# Patient Record
Sex: Male | Born: 1945 | Race: Black or African American | Hispanic: Yes | Marital: Married | Smoking: Former smoker
Health system: Southern US, Community
[De-identification: ages and names within clinical notes are randomized; demographics above are authoritative.]

## PROBLEM LIST (undated history)

## (undated) DIAGNOSIS — H269 Unspecified cataract: Secondary | ICD-10-CM

## (undated) DIAGNOSIS — D126 Benign neoplasm of colon, unspecified: Secondary | ICD-10-CM

## (undated) DIAGNOSIS — L309 Dermatitis, unspecified: Secondary | ICD-10-CM

## (undated) DIAGNOSIS — I10 Essential (primary) hypertension: Secondary | ICD-10-CM

## (undated) DIAGNOSIS — K219 Gastro-esophageal reflux disease without esophagitis: Secondary | ICD-10-CM

## (undated) DIAGNOSIS — I4891 Unspecified atrial fibrillation: Secondary | ICD-10-CM

## (undated) DIAGNOSIS — F4310 Post-traumatic stress disorder, unspecified: Secondary | ICD-10-CM

## (undated) DIAGNOSIS — E291 Testicular hypofunction: Secondary | ICD-10-CM

## (undated) DIAGNOSIS — M1990 Unspecified osteoarthritis, unspecified site: Secondary | ICD-10-CM

## (undated) DIAGNOSIS — J9691 Respiratory failure, unspecified with hypoxia: Secondary | ICD-10-CM

## (undated) DIAGNOSIS — F419 Anxiety disorder, unspecified: Secondary | ICD-10-CM

## (undated) DIAGNOSIS — G4733 Obstructive sleep apnea (adult) (pediatric): Secondary | ICD-10-CM

## (undated) DIAGNOSIS — G4700 Insomnia, unspecified: Secondary | ICD-10-CM

## (undated) DIAGNOSIS — Z8619 Personal history of other infectious and parasitic diseases: Secondary | ICD-10-CM

## (undated) DIAGNOSIS — Z9289 Personal history of other medical treatment: Secondary | ICD-10-CM

## (undated) DIAGNOSIS — C9250 Acute myelomonocytic leukemia, not having achieved remission: Secondary | ICD-10-CM

## (undated) DIAGNOSIS — N529 Male erectile dysfunction, unspecified: Secondary | ICD-10-CM

## (undated) DIAGNOSIS — E785 Hyperlipidemia, unspecified: Secondary | ICD-10-CM

## (undated) DIAGNOSIS — I444 Left anterior fascicular block: Secondary | ICD-10-CM

## (undated) DIAGNOSIS — G4730 Sleep apnea, unspecified: Secondary | ICD-10-CM

## (undated) HISTORY — PX: COLONOSCOPY: SHX174

## (undated) HISTORY — DX: Benign neoplasm of colon, unspecified: D12.6

## (undated) HISTORY — DX: Male erectile dysfunction, unspecified: N52.9

## (undated) HISTORY — DX: Gastro-esophageal reflux disease without esophagitis: K21.9

## (undated) HISTORY — DX: Testicular hypofunction: E29.1

## (undated) HISTORY — DX: Personal history of other medical treatment: Z92.89

## (undated) HISTORY — DX: Essential (primary) hypertension: I10

## (undated) HISTORY — DX: Left anterior fascicular block: I44.4

## (undated) HISTORY — DX: Unspecified osteoarthritis, unspecified site: M19.90

## (undated) HISTORY — DX: Post-traumatic stress disorder, unspecified: F43.10

## (undated) HISTORY — PX: HERNIA REPAIR: SHX51

## (undated) HISTORY — DX: Sleep apnea, unspecified: G47.30

## (undated) HISTORY — DX: Respiratory failure, unspecified with hypoxia: J96.91

## (undated) HISTORY — DX: Obstructive sleep apnea (adult) (pediatric): G47.33

## (undated) HISTORY — PX: QUADRICEPS TENDON REPAIR: SHX756

## (undated) HISTORY — DX: Unspecified atrial fibrillation: I48.91

## (undated) HISTORY — DX: Acute myelomonocytic leukemia, not having achieved remission: C92.50

## (undated) HISTORY — DX: Hyperlipidemia, unspecified: E78.5

## (undated) HISTORY — DX: Personal history of other infectious and parasitic diseases: Z86.19

---

## 1997-08-16 HISTORY — PX: KNEE ARTHROSCOPY: SUR90

## 2007-10-10 ENCOUNTER — Ambulatory Visit: Payer: Self-pay | Admitting: Internal Medicine

## 2007-10-10 DIAGNOSIS — M1990 Unspecified osteoarthritis, unspecified site: Secondary | ICD-10-CM | POA: Insufficient documentation

## 2007-10-10 LAB — CONVERTED CEMR LAB: Glucose, Bld: 208 mg/dL

## 2007-10-11 ENCOUNTER — Encounter: Payer: Self-pay | Admitting: Internal Medicine

## 2007-10-13 ENCOUNTER — Telehealth (INDEPENDENT_AMBULATORY_CARE_PROVIDER_SITE_OTHER): Payer: Self-pay | Admitting: *Deleted

## 2007-10-13 LAB — CONVERTED CEMR LAB
AST: 28 units/L (ref 0–37)
Basophils Relative: 0 % (ref 0.0–1.0)
CO2: 31 meq/L (ref 19–32)
Calcium: 9.3 mg/dL (ref 8.4–10.5)
Chloride: 103 meq/L (ref 96–112)
Eosinophils Absolute: 0.2 10*3/uL (ref 0.0–0.6)
Eosinophils Relative: 4.9 % (ref 0.0–5.0)
GFR calc Af Amer: 111 mL/min
GFR calc non Af Amer: 91 mL/min
Glucose, Bld: 132 mg/dL — ABNORMAL HIGH (ref 70–99)
HCT: 42.7 % (ref 39.0–52.0)
Lymphocytes Relative: 53.9 % — ABNORMAL HIGH (ref 12.0–46.0)
MCV: 84.7 fL (ref 78.0–100.0)
Neutro Abs: 1.6 10*3/uL (ref 1.4–7.7)
Neutrophils Relative %: 33.8 % — ABNORMAL LOW (ref 43.0–77.0)
Platelets: 472 10*3/uL — ABNORMAL HIGH (ref 150–400)
Sodium: 139 meq/L (ref 135–145)
WBC: 4.8 10*3/uL (ref 4.5–10.5)

## 2007-10-25 ENCOUNTER — Ambulatory Visit: Payer: Self-pay | Admitting: Internal Medicine

## 2007-10-25 DIAGNOSIS — E119 Type 2 diabetes mellitus without complications: Secondary | ICD-10-CM

## 2007-11-07 ENCOUNTER — Ambulatory Visit: Payer: Self-pay | Admitting: Internal Medicine

## 2007-11-13 ENCOUNTER — Telehealth (INDEPENDENT_AMBULATORY_CARE_PROVIDER_SITE_OTHER): Payer: Self-pay | Admitting: *Deleted

## 2007-11-27 ENCOUNTER — Encounter: Admission: RE | Admit: 2007-11-27 | Discharge: 2007-11-27 | Payer: Self-pay | Admitting: Internal Medicine

## 2007-11-27 ENCOUNTER — Encounter: Payer: Self-pay | Admitting: Internal Medicine

## 2008-01-01 ENCOUNTER — Ambulatory Visit: Payer: Self-pay | Admitting: Internal Medicine

## 2008-01-01 DIAGNOSIS — F4310 Post-traumatic stress disorder, unspecified: Secondary | ICD-10-CM

## 2008-01-02 ENCOUNTER — Encounter (INDEPENDENT_AMBULATORY_CARE_PROVIDER_SITE_OTHER): Payer: Self-pay | Admitting: *Deleted

## 2008-01-04 ENCOUNTER — Telehealth (INDEPENDENT_AMBULATORY_CARE_PROVIDER_SITE_OTHER): Payer: Self-pay | Admitting: *Deleted

## 2008-01-31 ENCOUNTER — Ambulatory Visit: Payer: Self-pay | Admitting: Internal Medicine

## 2008-02-28 ENCOUNTER — Encounter: Payer: Self-pay | Admitting: Internal Medicine

## 2008-02-28 ENCOUNTER — Ambulatory Visit: Payer: Self-pay | Admitting: Internal Medicine

## 2008-02-28 LAB — HM COLONOSCOPY

## 2008-03-03 ENCOUNTER — Encounter: Payer: Self-pay | Admitting: Internal Medicine

## 2008-03-04 ENCOUNTER — Encounter: Payer: Self-pay | Admitting: Internal Medicine

## 2008-03-08 ENCOUNTER — Ambulatory Visit: Payer: Self-pay | Admitting: Internal Medicine

## 2008-03-13 ENCOUNTER — Telehealth (INDEPENDENT_AMBULATORY_CARE_PROVIDER_SITE_OTHER): Payer: Self-pay | Admitting: *Deleted

## 2008-04-17 ENCOUNTER — Ambulatory Visit: Payer: Self-pay | Admitting: Internal Medicine

## 2008-04-17 DIAGNOSIS — F528 Other sexual dysfunction not due to a substance or known physiological condition: Secondary | ICD-10-CM

## 2008-04-17 DIAGNOSIS — E785 Hyperlipidemia, unspecified: Secondary | ICD-10-CM

## 2008-04-28 LAB — CONVERTED CEMR LAB
Hgb A1c MFr Bld: 6.2 % — ABNORMAL HIGH (ref 4.6–6.0)
Testosterone: 212.02 ng/dL — ABNORMAL LOW (ref 350.00–890)

## 2008-07-17 ENCOUNTER — Ambulatory Visit: Payer: Self-pay | Admitting: Internal Medicine

## 2008-07-17 DIAGNOSIS — H409 Unspecified glaucoma: Secondary | ICD-10-CM | POA: Insufficient documentation

## 2008-07-18 ENCOUNTER — Ambulatory Visit: Payer: Self-pay | Admitting: Internal Medicine

## 2008-07-23 ENCOUNTER — Telehealth (INDEPENDENT_AMBULATORY_CARE_PROVIDER_SITE_OTHER): Payer: Self-pay | Admitting: *Deleted

## 2008-07-24 ENCOUNTER — Telehealth (INDEPENDENT_AMBULATORY_CARE_PROVIDER_SITE_OTHER): Payer: Self-pay | Admitting: *Deleted

## 2008-07-24 LAB — CONVERTED CEMR LAB
ALT: 29 units/L (ref 0–53)
Cholesterol: 203 mg/dL (ref 0–200)
Direct LDL: 146 mg/dL
FSH: 4.7 milliintl units/mL
LH: 2.8 milliintl units/mL
Prolactin: 7.9 ng/mL
VLDL: 11 mg/dL (ref 0–40)

## 2008-08-06 ENCOUNTER — Encounter (INDEPENDENT_AMBULATORY_CARE_PROVIDER_SITE_OTHER): Payer: Self-pay | Admitting: *Deleted

## 2008-08-13 ENCOUNTER — Encounter: Payer: Self-pay | Admitting: Internal Medicine

## 2008-09-18 ENCOUNTER — Encounter: Payer: Self-pay | Admitting: Internal Medicine

## 2008-11-18 ENCOUNTER — Ambulatory Visit: Payer: Self-pay | Admitting: Internal Medicine

## 2008-11-18 DIAGNOSIS — E291 Testicular hypofunction: Secondary | ICD-10-CM | POA: Insufficient documentation

## 2008-11-21 LAB — CONVERTED CEMR LAB
ALT: 20 units/L (ref 0–53)
AST: 22 units/L (ref 0–37)
Basophils Absolute: 0.1 10*3/uL (ref 0.0–0.1)
Calcium: 8.7 mg/dL (ref 8.4–10.5)
GFR calc non Af Amer: 110.24 mL/min (ref >60)
Glucose, Bld: 104 mg/dL — ABNORMAL HIGH (ref 70–99)
HDL: 42.9 mg/dL (ref >39.00)
Hemoglobin: 14 g/dL (ref 13.0–17.0)
Lymphocytes Relative: 28.4 % (ref 12.0–46.0)
Monocytes Relative: 10.5 % (ref 3.0–12.0)
Neutro Abs: 3.6 10*3/uL (ref 1.4–7.7)
Platelets: 410 10*3/uL — ABNORMAL HIGH (ref 150.0–400.0)
RDW: 13.6 % (ref 11.5–14.6)
Sodium: 142 meq/L (ref 135–145)
Total CHOL/HDL Ratio: 4
WBC: 6.5 10*3/uL (ref 4.5–10.5)

## 2009-03-12 ENCOUNTER — Encounter (INDEPENDENT_AMBULATORY_CARE_PROVIDER_SITE_OTHER): Payer: Self-pay | Admitting: *Deleted

## 2009-05-15 ENCOUNTER — Ambulatory Visit: Payer: Self-pay | Admitting: Internal Medicine

## 2009-05-15 LAB — CONVERTED CEMR LAB
Microalb Creat Ratio: 15.6 mg/g (ref 0.0–30.0)
Microalb, Ur: 3 mg/dL — ABNORMAL HIGH (ref 0.0–1.9)

## 2009-05-20 ENCOUNTER — Encounter (INDEPENDENT_AMBULATORY_CARE_PROVIDER_SITE_OTHER): Payer: Self-pay | Admitting: *Deleted

## 2009-05-20 LAB — CONVERTED CEMR LAB
ALT: 27 units/L (ref 0–53)
AST: 28 units/L (ref 0–37)
Cholesterol: 176 mg/dL (ref 0–200)
PSA: 0.98 ng/mL (ref 0.10–4.00)

## 2009-09-23 ENCOUNTER — Ambulatory Visit: Payer: Self-pay | Admitting: Internal Medicine

## 2009-09-23 DIAGNOSIS — I10 Essential (primary) hypertension: Secondary | ICD-10-CM | POA: Insufficient documentation

## 2009-09-23 DIAGNOSIS — G479 Sleep disorder, unspecified: Secondary | ICD-10-CM | POA: Insufficient documentation

## 2009-09-25 ENCOUNTER — Encounter (INDEPENDENT_AMBULATORY_CARE_PROVIDER_SITE_OTHER): Payer: Self-pay | Admitting: *Deleted

## 2009-09-26 ENCOUNTER — Ambulatory Visit: Payer: Self-pay | Admitting: Internal Medicine

## 2009-09-26 LAB — CONVERTED CEMR LAB
ALT: 32 units/L (ref 0–53)
AST: 28 units/L (ref 0–37)
CO2: 32 meq/L (ref 19–32)
Chloride: 104 meq/L (ref 96–112)
GFR calc non Af Amer: 109.93 mL/min (ref >60)
Glucose, Bld: 108 mg/dL — ABNORMAL HIGH (ref 70–99)
Potassium: 4.4 meq/L (ref 3.5–5.1)
Sodium: 142 meq/L (ref 135–145)

## 2009-10-06 ENCOUNTER — Ambulatory Visit: Payer: Self-pay | Admitting: Pulmonary Disease

## 2009-10-06 DIAGNOSIS — G4733 Obstructive sleep apnea (adult) (pediatric): Secondary | ICD-10-CM

## 2009-10-14 DIAGNOSIS — I10 Essential (primary) hypertension: Secondary | ICD-10-CM

## 2009-10-14 HISTORY — DX: Essential (primary) hypertension: I10

## 2009-10-18 DIAGNOSIS — F518 Other sleep disorders not due to a substance or known physiological condition: Secondary | ICD-10-CM

## 2009-10-21 ENCOUNTER — Ambulatory Visit: Payer: Self-pay | Admitting: Internal Medicine

## 2009-10-28 ENCOUNTER — Telehealth (INDEPENDENT_AMBULATORY_CARE_PROVIDER_SITE_OTHER): Payer: Self-pay | Admitting: *Deleted

## 2009-10-29 ENCOUNTER — Ambulatory Visit: Payer: Self-pay | Admitting: Internal Medicine

## 2010-01-21 ENCOUNTER — Ambulatory Visit: Payer: Self-pay | Admitting: Internal Medicine

## 2010-01-30 ENCOUNTER — Ambulatory Visit: Payer: Self-pay | Admitting: Internal Medicine

## 2010-02-04 LAB — CONVERTED CEMR LAB
ALT: 25 units/L (ref 0–53)
BUN: 22 mg/dL (ref 6–23)
Chloride: 107 meq/L (ref 96–112)
Creatinine, Ser: 0.9 mg/dL (ref 0.4–1.5)
HDL: 43.3 mg/dL (ref >39.00)
Hgb A1c MFr Bld: 6.4 % (ref 4.6–6.5)
Total CHOL/HDL Ratio: 4
VLDL: 11.8 mg/dL (ref 0.0–40.0)

## 2010-02-05 ENCOUNTER — Ambulatory Visit: Payer: Self-pay | Admitting: Internal Medicine

## 2010-03-04 ENCOUNTER — Ambulatory Visit: Payer: Self-pay | Admitting: Internal Medicine

## 2010-03-12 ENCOUNTER — Encounter: Payer: Self-pay | Admitting: Internal Medicine

## 2010-05-25 ENCOUNTER — Ambulatory Visit: Payer: Self-pay | Admitting: Internal Medicine

## 2010-05-27 LAB — CONVERTED CEMR LAB
Basophils Absolute: 0.1 10*3/uL (ref 0.0–0.1)
Eosinophils Absolute: 0.1 10*3/uL (ref 0.0–0.7)
Hemoglobin: 14.1 g/dL (ref 13.0–17.0)
Hgb A1c MFr Bld: 6.6 % — ABNORMAL HIGH (ref 4.6–6.5)
Lymphocytes Relative: 24.7 % (ref 12.0–46.0)
MCHC: 34.3 g/dL (ref 30.0–36.0)
Monocytes Relative: 9.6 % (ref 3.0–12.0)
Neutro Abs: 4.1 10*3/uL (ref 1.4–7.7)
Neutrophils Relative %: 62.5 % (ref 43.0–77.0)
Platelets: 396 10*3/uL (ref 150.0–400.0)
RDW: 15.1 % — ABNORMAL HIGH (ref 11.5–14.6)
TSH: 0.93 microintl units/mL (ref 0.35–5.50)

## 2010-05-29 ENCOUNTER — Encounter: Payer: Self-pay | Admitting: Pulmonary Disease

## 2010-05-29 ENCOUNTER — Ambulatory Visit (HOSPITAL_BASED_OUTPATIENT_CLINIC_OR_DEPARTMENT_OTHER): Admission: RE | Admit: 2010-05-29 | Discharge: 2010-05-29 | Payer: Self-pay | Admitting: Pulmonary Disease

## 2010-06-17 ENCOUNTER — Telehealth (INDEPENDENT_AMBULATORY_CARE_PROVIDER_SITE_OTHER): Payer: Self-pay | Admitting: *Deleted

## 2010-06-17 ENCOUNTER — Ambulatory Visit: Payer: Self-pay | Admitting: Pulmonary Disease

## 2010-06-26 ENCOUNTER — Encounter: Payer: Self-pay | Admitting: Pulmonary Disease

## 2010-07-10 ENCOUNTER — Encounter: Payer: Self-pay | Admitting: Pulmonary Disease

## 2010-07-21 ENCOUNTER — Ambulatory Visit: Payer: Self-pay | Admitting: Pulmonary Disease

## 2010-08-25 ENCOUNTER — Ambulatory Visit: Admit: 2010-08-25 | Payer: Self-pay | Admitting: Pulmonary Disease

## 2010-08-26 ENCOUNTER — Telehealth: Payer: Self-pay | Admitting: Pulmonary Disease

## 2010-09-13 LAB — CONVERTED CEMR LAB
ALT: 22 units/L (ref 0–53)
BUN: 17 mg/dL (ref 6–23)
Calcium: 8.9 mg/dL (ref 8.4–10.5)
Cholesterol: 192 mg/dL (ref 0–200)
Creatinine, Ser: 0.8 mg/dL (ref 0.4–1.5)
GFR calc Af Amer: 127 mL/min
GFR calc non Af Amer: 105 mL/min
Glucose, Bld: 105 mg/dL — ABNORMAL HIGH (ref 70–99)
HDL: 32.9 mg/dL — ABNORMAL LOW (ref >39.0)
LDL Cholesterol: 147 mg/dL — ABNORMAL HIGH (ref 0–99)
PSA: 0.97 ng/mL (ref 0.10–4.00)
TSH: 1.17 microintl units/mL (ref 0.35–5.50)
VLDL: 13 mg/dL (ref 0–40)

## 2010-09-15 ENCOUNTER — Ambulatory Visit
Admission: RE | Admit: 2010-09-15 | Discharge: 2010-09-15 | Payer: Self-pay | Source: Home / Self Care | Attending: Pulmonary Disease | Admitting: Pulmonary Disease

## 2010-09-15 NOTE — Assessment & Plan Note (Signed)
Summary: rto 4 months/cbs   Vital Signs:  Patient profile:   65 year old male Weight:      266 pounds Pulse rate:   76 / minute Pulse rhythm:   regular BP sitting:   136 / 84  (left arm) Cuff size:   large  Vitals Entered By: [name] CMA (May 25, 2010 11:04 AM) CC: follow up- fasting Comments [facility] Declines flu shot    History of Present Illness: ROV  Diabetes-- diet ok, has cut down on fats  note from ophthalmology reviewed, eye exam 7-11, no  retinopathy, mild cataracts and glaucoma  hypertension, ambulatory BPs 109/70s   will try to get the sleep study here in [place] w/ Dr Shelle Iron .   Current Medications (verified): 1)  Freestyle Lite   Strp (Glucose Blood) .... Check Bs Three Times A Day Dx 250.00 2)  Freestyle Lite Lancets .... Check Bs Three Times A Day Dx 250.00 3)  Baby Aspirin 81 Mg  Chew (Aspirin) .Marland Kitchen.. 1 A  Day 4)  Lunesta 1 Mg  Tabs (Eszopiclone) .... One At Night 5)  Crestor 10 Mg Tabs (Rosuvastatin Calcium) .Marland Kitchen.. 1 By Mouth At Bedtime - Due Lab Work in 6 Weeks - Stop Pravachol 6)  Cialis 20 Mg Tabs (Tadalafil) .Marland Kitchen.. 1 By Mouth Every Other Day 7)  Eye Drops For Glaucoma 8)  Androgel .... (Per Endo) 9)  Lisinopril 20 Mg Tabs (Lisinopril) .Marland Kitchen.. 1 By Mouth Once Daily -- Needs Nurse Visit in 2 Weeks To Check Bp  Allergies (verified): No Known Drug Allergies  Past History:  Past Medical History: Osteoarthritis--L knee Diabetes mellitus, type II Dx 2-09 eye exam 7-11, no  retinopathy, mild cataracts and glaucoma hypertension, dx  3-11 Dx w/ PTSD (went to [name], has chronic diff.  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 started at age 86. 2 ppd.  quit 1993.    Alcohol Use - no Illicit Drug Use - no Patient gets regular exercise.  Review of Systems CV:  Denies chest pain or discomfort and swelling of feet. Resp:  Denies cough and shortness of breath.  Physical Exam  General:  alert and well-developed.  has gained some weight Lungs:  normal respiratory effort, no intercostal retractions, no accessory muscle use, and normal breath sounds.   Heart:  normal rate, regular rhythm, and no murmur.   Psych:  not anxious appearing and not depressed appearing.     Impression & Recommendations:  Problem # 1:  ESSENTIAL HYPERTENSION (ICD-401.9) at goal  His updated medication list for this problem includes:    Lisinopril 20 Mg Tabs (Lisinopril) .Marland Kitchen... 1 by mouth once daily -- needs nurse visit in 2 weeks to check bp  BP today: 136/84 Prior BP: 136/86 (03/04/2010)  Labs Reviewed: K+: 4.5 (01/30/2010) Creat: : 0.9 (01/30/2010)   Chol: 156 (01/30/2010)   HDL: 43.30 (01/30/2010)   LDL: 101 (01/30/2010)   TG: 59.0 (01/30/2010)  Orders: TLB-CBC Platelet - w/Differential (85025-CBCD) Specimen Handling (16109)  Orders: TLB-CBC Platelet - w/Differential (85025-CBCD) Specimen Handling (60454)  Problem # 2:  DIABETES MELLITUS, TYPE II (ICD-250.00) labs, diet discussed , info provided  has gained some weight, encourage to work on healthier lifestyle  His updated medication  list for this problem includes:    Baby Aspirin 81 Mg Chew (Aspirin) .Marland Kitchen... 1 a  day    Lisinopril 20 Mg Tabs (Lisinopril) .Marland Kitchen... 1 by mouth once daily -- needs nurse visit in 2 weeks to check bp  Labs Reviewed: Creat: 0.9 (01/30/2010)    Reviewed HgBA1c results: 6.4 (01/30/2010)  6.2 (05/15/2009)  Orders: Venipuncture (09811) TLB-A1C / Hgb A1C (Glycohemoglobin) (83036-A1C) Specimen Handling (91478)  Problem # 3:  SLEEP DISORDER (ICD-780.50) needs a sleep study, unable to get it done through [facility] will  send a note to DR Clance to see how to reschedule   Complete  Medication List: 1)  Freestyle Lite Strp (Glucose blood) .... Check bs three times a day dx 250.00 2)  Freestyle Lite Lancets  .... Check bs three times a day dx 250.00 3)  Baby Aspirin 81 Mg Chew (Aspirin) .Marland Kitchen.. 1 a  day 4)  Lunesta 1 Mg Tabs (Eszopiclone) .... One at night 5)  Crestor 10 Mg Tabs (Rosuvastatin calcium) .Marland Kitchen.. 1 by mouth at bedtime - due lab work in 6 weeks - stop pravachol 6)  Cialis 20 Mg Tabs (Tadalafil) .Marland Kitchen.. 1 by mouth every other day 7)  Eye Drops For Glaucoma  8)  Androgel  .... (per endo) 9)  Lisinopril 20 Mg Tabs (Lisinopril) .Marland Kitchen.. 1 by mouth once daily -- needs nurse visit in 2 weeks to check bp  Other Orders: TLB-TSH (Thyroid Stimulating Hormone) (84443-TSH)  Patient Instructions: 1)  Please schedule a follow-up appointment in 4 to 5 months .  Prescriptions: LISINOPRIL 20 MG TABS (LISINOPRIL) 1 by mouth once daily -- NEEDS NURSE VISIT IN 2 WEEKS TO CHECK BP  #90 x 1   Entered by:   [name] CMA   Authorized by:   Nolon Rod. Tauno Falotico MD   Signed by:   Barbaraann Share MD on 05/25/2010   Method used:   Electronically to        [id number]* (retail)       [address]       [place], [place]  [address]       Ph: [phone]       Fax: [phone]   RxID:   [id number] CRESTOR 10 MG TABS (ROSUVASTATIN CALCIUM) 1 by mouth at bedtime - DUE LAB WORK IN 6 WEEKS - STOP PRAVACHOL  #90 x 1   Entered by:   [name] CMA   Authorized by:   Nolon Rod. Kyce Ging MD   Signed by:   Barbaraann Share MD on 05/25/2010   Method used:   Electronically to        [id number]* (retail)       [address]       [place], [place]  [address]       Ph: [phone]       Fax: [phone]   RxID:   [id number]    Influenza Immunization History:    Influenza # 1:  to get it at work  (05/25/2010)

## 2010-09-15 NOTE — Assessment & Plan Note (Signed)
Summary: consult for possible osa   Copy to:  [facility] Primary Provider/Referring Provider:  Nolon Rod. Paz MD  CC:  Sleep Consult.  History of Present Illness: The pt is a 65y/o male who I have been asked to see for possible osa.  He has been noted to have loud snoring by his bed partner, as well as choking arousals and pauses in his breathing during sleep.  He goes to bed btw 83mn-2am, and arises at 5am to start his day.  He feels rested upon awakening, but then begins to have severe sleep pressure starting at 2pm.  He can have significant sleepiness at work in the afternoons, and dozes with tv or movies in the evening.  He also has some sleepiness with driving.  His weight is up 40 pounds over the last 2 years, and his epworth score today is 19.  Medications Prior to Update: 1)  Freestyle Lite   Strp (Glucose Blood) .... Check Bs Three Times A Day Dx 250.00 2)  Freestyle Lite Lancets .... Check Bs Three Times A Day Dx 250.00 3)  Baby Aspirin 81 Mg  Chew (Aspirin) .Marland Kitchen.. 1 A  Day 4)  Lunesta 1 Mg  Tabs (Eszopiclone) .... One At Night 5)  Crestor 10 Mg Tabs (Rosuvastatin Calcium) .Marland Kitchen.. 1 By Mouth At Bedtime - Due Lab Work in 6 Weeks - Stop Pravachol 6)  Cialis 20 Mg Tabs (Tadalafil) .Marland Kitchen.. 1 By Mouth Every Other Day 7)  Eye Drops For Glaucoma 8)  Flexeril 10 Mg Tabs (Cyclobenzaprine Hcl) .Marland Kitchen.. 1 By Mouth Qhs 9)  Androgel .... (Per Endo)  Allergies (verified): No Known Drug Allergies  Past History:  Past Medical History: Reviewed history from 09/23/2009 and no changes required. Osteoarthritis--L knee Diabetes mellitus, type II Dx 2-09 Dx w/ PTSD (went to [name], has chronic diff.  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 h/o Meniscal torn L, arthroscopy Dr Jerl Santos  (1999)  Family History: Reviewed history from 01/31/2008 and no changes  required. heart disease: mother, father cancer: father (colon)   Social History: Reviewed history from 01/31/2008 and no changes required. 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 started at age 65. 2 ppd.  quit 1993.  Alcohol Use - no Illicit Drug Use - no Patient gets regular exercise.  Review of Systems  The patient denies shortness of breath with activity, shortness of breath at rest, productive cough, non-productive cough, coughing up blood, chest pain, irregular heartbeats, acid heartburn, indigestion, loss of appetite, weight change, abdominal pain, difficulty swallowing, sore throat, tooth/dental problems, headaches, nasal congestion/difficulty breathing through nose, sneezing, itching, ear ache, anxiety, depression, hand/feet swelling, joint stiffness or pain, rash, change in color of mucus, and fever.    Vital Signs:  Patient profile:   65 year old male Height:      71 inches Weight:      276.13 pounds BMI:     38.65 O2 Sat:      97 % on Room air Temp:     97.8 degrees F oral Pulse rate:   100 / minute BP sitting:   158 / 100  (right arm) Cuff size:   large  Vitals Entered By: Arman Filter LPN (October 06, 2009 11:47 AM)  O2 Flow:  Room air CC:  Sleep Consult Comments Medications reviewed with patient Arman Filter LPN  October 06, 2009 11:58 AM    Physical Exam  General:  ow male in nad Eyes:  PERRLA and EOMI.   Nose:  deviated septum to left with near obstruction Mouth:  significant elongation of soft palate and uvula side wall encroachment Neck:  no jvd, tmg, LN Lungs:  clear to auscultation Heart:  rrr, no mrg Abdomen:  soft and nontender, bs+ Extremities:  mild edema, pulses intact distally no cyanosis Neurologic:  alert, and oriented, moves all 4.   Impression & Recommendations:  Problem # 1:  OBSTRUCTIVE SLEEP APNEA (ICD-327.23) the  pt's history is very suggestive of osa.  He has very abnormal upper airway anatomy, large recent weight gain, and signficant afternoon sleepiness with periods of inactivity.  I have had a long discussion with pt about osa, including its impact on his QOL and CV health.  At this point, I think he needs a sleep study.    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  I have asked him to try and prioritize for at least 6hrs of sleep a night. I have also reminded him of his moral responsibility to not drive if sleepy.  Other Orders: Consultation Level IV (95284) Sleep Disorder Referral (Sleep Disorder)  Patient Instructions: 1)  will schedule for sleep study 2)  will arrange followup once sleep study results available.

## 2010-09-15 NOTE — Letter (Signed)
Summary: no DM retinopathy, + mild glaucoma, +cataracts  [facility]   Imported By: Lanelle Bal 03/25/2010 11:28:59  _____________________________________________________________________  External Attachment:    Type:   Image     Comment:   External Document  Appended Document: no DM retinopathy, + mild glaucoma, +cataracts    Clinical Lists Changes  Observations: Added new observation of EYE EXAM: Neg DM eye exam (03/31/2010 8:07)

## 2010-09-15 NOTE — Assessment & Plan Note (Signed)
Summary: bp checl/cbs  Nurse Visit   Vital Signs:  Patient profile:   65 year old male Height:      71 inches Weight:      261 pounds Temp:     98.2 degrees F oral Pulse rate:   68 / minute BP sitting:   100 / 72  (left arm)  Vitals Entered By: Jeremy Johann CMA (February 05, 2010 10:56 AM)  Impression & Recommendations:  Problem # 1:  ESSENTIAL HYPERTENSION (ICD-401.9) BP slightly low today, see instructions His updated medication list for this problem includes:    Lisinopril 20 Mg Tabs (Lisinopril) .Marland Kitchen... 1 by mouth once daily -- needs nurse visit in 2 weeks to check bp  Orders: No Charge Patient Arrived (NCPA0) (NCPA0)  BP today: 100/72 Prior BP: 160/86 (01/21/2010)  Labs Reviewed: K+: 4.5 (01/30/2010) Creat: : 0.9 (01/30/2010)   Chol: 156 (01/30/2010)   HDL: 43.30 (01/30/2010)   LDL: 101 (01/30/2010)   TG: 59.0 (01/30/2010)  Complete Medication List: 1)  Freestyle Lite Strp (Glucose blood) .... Check bs three times a day dx 250.00 2)  Freestyle Lite Lancets  .... Check bs three times a day dx 250.00 3)  Baby Aspirin 81 Mg Chew (Aspirin) .Marland Kitchen.. 1 a  day 4)  Lunesta 1 Mg Tabs (Eszopiclone) .... One at night 5)  Crestor 10 Mg Tabs (Rosuvastatin calcium) .Marland Kitchen.. 1 by mouth at bedtime - due lab work in 6 weeks - stop pravachol 6)  Cialis 20 Mg Tabs (Tadalafil) .Marland Kitchen.. 1 by mouth every other day 7)  Eye Drops For Glaucoma  8)  Androgel  .... (per endo) 9)  Lisinopril 20 Mg Tabs (Lisinopril) .Marland Kitchen.. 1 by mouth once daily -- needs nurse visit in 2 weeks to check bp   Patient Instructions: 1)  Please schedule a follow-up appointment in 1 month for BP check. nurse visit only. 2)  --monitor symptoms for low bp readings  CC: bp check Comments --pt denies any symptoms REVIEWED MED LIST, PATIENT AGREED DOSE AND INSTRUCTION CORRECT    Allergies (verified): No Known Drug Allergies  Orders Added: 1)  No Charge Patient Arrived (NCPA0) [NCPA0]

## 2010-09-15 NOTE — Progress Notes (Signed)
Summary: pt needs ov to discuss sleep study results  Phone Note Outgoing Call   Call placed by: Carver Fila,  June 17, 2010 9:18 AM Call placed to: Patient Summary of Call: lmomtcb x1. pt needs ov with Dr. Shelle Iron  to discuss sleep study results. Carver Fila  June 17, 2010 9:19 AM   Follow-up for Phone Call        lmomtcb x2 Carver Fila  June 19, 2010 8:44 AM   Additional Follow-up for Phone Call Additional follow up Details #1::        lm with wife for him to give our office a call back Carver Fila  June 22, 2010 10:32 AM       Additional Follow-up for Phone Call Additional follow up Details #2::    lmomtcb x3.. will send a letter out to patient home stating for him to call us. Carver Fila  June 26, 2010 4:57 PM

## 2010-09-15 NOTE — Letter (Signed)
Summary: [facility]  [address]   [place], [place] [address]   Phone: [phone]  Fax: [phone]    06/26/2010  William Stokes [address] [place], [place]  [address]  Dear [name],   We have attempted to contact you multiple times at your home phone number and have been unable to reach you. If you would please give our office a call back to schedule an office visit with Dr. Shelle Iron as soon as possible. Our office hours are from 8:00 a.m. to 5:30 p.m. Monday through Friday.       Sincerely,   [employer]

## 2010-09-15 NOTE — Assessment & Plan Note (Signed)
Summary: 4 mth fu/kdc   Vital Signs:  Patient profile:   65 year old Stokes Height:      71 inches Weight:      265 pounds Temp:     98.5 degrees F oral Pulse rate:   86 / minute BP sitting:   160 / 86  (left arm)  Vitals Entered By: Jeremy Johann CMA (January 21, 2010 8:45 AM) CC: f/u elbow Comments --discuss med record REVIEWED MED LIST, PATIENT AGREED DOSE AND INSTRUCTION CORRECT    History of Present Illness: OSA-- s/p eval by pulmonary, symptoms quite suggestive for OSA, note reviewed, was offered a sleep study. he went to his sleep study test and he was told he was not in the schedule. Patient frustrated.  elbow infection, MRSA, responded quikly to abx, doing well    Diabetes mellitus-- ambulatory CBGs  x3  a week, in the low 100s; runned out of lisinopril a while back  Hyperlipidemia-- good medication compliance , no apparent s/e     Allergies (verified): No Known Drug Allergies  Past History:  Past Medical History: Osteoarthritis--L knee Diabetes mellitus, type II Dx 2-09 hypertension, dx  3-11 Dx w/ PTSD (went to [name], has chronic diff.  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 h/o Meniscal torn L, arthroscopy Dr Jerl Santos  (1999)  Social History: Reviewed history from 10/06/2009 and no changes required. 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 started at age 89. 2 ppd.  quit 1993.  Alcohol Use - no Illicit Drug Use - no Patient gets regular exercise.  Review of Systems General:  feels well, rarely fatigued . CV:  Denies chest pain or discomfort and swelling of feet. Resp:  Denies cough and shortness of breath. GI:  Denies nausea and vomiting.  Physical Exam  General:  alert, well-developed, and  overweight-appearing.   Lungs:  normal respiratory effort, no intercostal retractions, no accessory muscle use, and normal breath sounds.   Heart:  normal rate, regular rhythm, and no murmur.   Extremities:  no pretibial edema bilaterally    Impression & Recommendations:  Problem # 1:  OBSTRUCTIVE SLEEP APNEA (ICD-327.23) per patient, he went to his sleep study and was told he was not on the  schedule. He is [facility]  Problem # 2:  HYPERLIPIDEMIA (ICD-272.4) now on Crestor, labs His updated medication list for this problem includes:    Crestor 10 Mg Tabs (Rosuvastatin calcium) .Marland Kitchen... 1 by mouth at bedtime - due lab work in 6 weeks - stop pravachol  Labs Reviewed: SGOT: 28 (09/26/2009)   SGPT: 32 (09/26/2009)   HDL:43.40 (09/26/2009), 39.00 (05/15/2009)  LDL:116 (09/26/2009), 126 (05/15/2009)  Chol:172 (09/26/2009), 176 (05/15/2009)  Trig:63.0 (09/26/2009), 56.0 (05/15/2009)  Problem # 3:  ESSENTIAL HYPERTENSION (ICD-401.9) out of lisinopril for a while. restart meds, see instructions no charge for nurse visit His updated medication list for this problem includes:    Lisinopril 20 Mg Tabs (Lisinopril) .Marland Kitchen... 1 by mouth once daily -- needs nurse visit in 2 weeks to check bp  Problem # 4:  DIABETES MELLITUS, TYPE II (ICD-250.00)  labs His updated medication list for this problem includes:    Baby Aspirin 81 Mg Chew (Aspirin) .Marland KitchenMarland KitchenMarland KitchenMarland Kitchen  1 a  day    Lisinopril 20 Mg Tabs (Lisinopril) .Marland Kitchen... 1 by mouth once daily -- needs nurse visit in 2 weeks to check bp  Labs Reviewed: Creat: 0.9 (09/26/2009)    Reviewed HgBA1c results: 6.2 (05/15/2009)  6.2 (11/18/2008)  Complete Medication List: 1)  Freestyle Lite Strp (Glucose blood) .... Check bs three times a day dx 250.00 2)  Freestyle Lite Lancets  .... Check bs three times a day dx 250.00 3)  Baby Aspirin 81 Mg Chew (Aspirin) .Marland Kitchen.. 1 a  day 4)  Lunesta 1 Mg Tabs  (Eszopiclone) .... One at night 5)  Crestor 10 Mg Tabs (Rosuvastatin calcium) .Marland Kitchen.. 1 by mouth at bedtime - due lab work in 6 weeks - stop pravachol 6)  Cialis 20 Mg Tabs (Tadalafil) .Marland Kitchen.. 1 by mouth every other day 7)  Eye Drops For Glaucoma  8)  Androgel  .... (per endo) 9)  Lisinopril 20 Mg Tabs (Lisinopril) .Marland Kitchen.. 1 by mouth once daily -- needs nurse visit in 2 weeks to check bp  Patient Instructions: 1)  please restart lisinopril 2)  Come back in 2 weeks, fasting,  for a BP check and labs (no charge) 3)  FLP, AST, ALT---- dx high cholesterol 4)  BMP----- dx  hypertension 5)  Hemoglobin A1c, microalbumin----- dx  diabetes 6)  Please schedule a follow-up appointment in 4 months .  Prescriptions: LISINOPRIL 20 MG TABS (LISINOPRIL) 1 by mouth once daily -- NEEDS NURSE VISIT IN 2 WEEKS TO CHECK BP  #90 x 1   Entered and Authorized by:   Nolon Rod. Marshe Shrestha MD   Signed by:   Nolon Rod. Patti Shorb MD on 01/21/2010   Method used:   Electronically to        [id number]* (retail)       [address]       [place], [place]  [address]       Ph: [phone]       Fax: [phone]   RxID:   [id number] CRESTOR 10 MG TABS (ROSUVASTATIN CALCIUM) 1 by mouth at bedtime - DUE LAB WORK IN 6 WEEKS - STOP PRAVACHOL  #90 x 1   Entered and Authorized by:   Nolon Rod. Danicka Hourihan MD   Signed by:   Nolon Rod. Dannette Kinkaid MD on 01/21/2010   Method used:   Electronically to        [id number]* (retail)       [address]       [place], [place]  [address]       Ph: [phone]       Fax: [phone]   RxID:   [id number]

## 2010-09-15 NOTE — Assessment & Plan Note (Signed)
Summary: acue/rash right elbow/alr   Vital Signs:  Patient profile:   65 year old male Height:      71 inches Weight:      267.8 pounds Temp:     98.5 degrees F BP sitting:   140 / 80  Vitals Entered By: Shary Decamp (October 29, 2009 3:59 PM) CC: sore on rt elbow x 4 days, + drainage, hx of MRSA a few years ago   History of Present Illness:  sore on rt elbow x 4 days, + drainage, hx of MRSA a few years ago  Current Medications (verified): 1)  Freestyle Lite   Strp (Glucose Blood) .... Check Bs Three Times A Day Dx 250.00 2)  Freestyle Lite Lancets .... Check Bs Three Times A Day Dx 250.00 3)  Baby Aspirin 81 Mg  Chew (Aspirin) .Marland Kitchen.. 1 A  Day 4)  Lunesta 1 Mg  Tabs (Eszopiclone) .... One At Night 5)  Crestor 10 Mg Tabs (Rosuvastatin Calcium) .Marland Kitchen.. 1 By Mouth At Bedtime - Due Lab Work in 6 Weeks - Stop Pravachol 6)  Cialis 20 Mg Tabs (Tadalafil) .Marland Kitchen.. 1 By Mouth Every Other Day 7)  Eye Drops For Glaucoma 8)  Flexeril 10 Mg Tabs (Cyclobenzaprine Hcl) .Marland Kitchen.. 1 By Mouth Qhs 9)  Androgel .... (Per Endo) 10)  Lisinopril 20 Mg Tabs (Lisinopril) .Marland Kitchen.. 1 By Mouth Once Daily -- Needs Nurse Visit in 2 Weeks To Check Bp  Allergies (verified): No Known Drug Allergies  Past History:  Past Medical History: Reviewed history from 09/23/2009 and no changes required. Osteoarthritis--L knee Diabetes mellitus, type II Dx 2-09 Dx w/ PTSD (went to [name], has chronic diff.  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 h/o Meniscal torn L, arthroscopy Dr Jerl Santos  (1999)  Review of Systems       no history of fever no history of clots or injury in that arm other  than the area of infection, the arm feels fine  Physical Exam  General:  alert and well-developed.   Extremities:  left arm normal right arm, distal from the elbow has the area of  induration of  approximately 3/4 of an inch, + spontaneous  drainage of purulent material, I was able to squeeze out 1/3  of a cc of pus  that was sent to culture  the arm itself is otherwise normal to palpation , no fluctuant areas (?slightly swollen)   Impression & Recommendations:  Problem # 1:  ABSCESS, SKIN (ICD-682.9)  warm compress call immediately if   area of swelling increases doxycycline   His updated medication list for this problem includes:    Vibramycin 100 Mg Caps (Doxycycline hyclate) ..... One by mouth twice a day for 10 days  Orders: T-Culture, Wound ([id number])  Complete Medication List: 1)  Freestyle Lite Strp (Glucose blood) .... Check bs three times a day dx 250.00 2)  Freestyle Lite Lancets  .... Check bs three times a day dx 250.00 3)  Baby Aspirin 81 Mg Chew (Aspirin) .Marland Kitchen.. 1 a  day 4)  Lunesta 1 Mg Tabs (Eszopiclone) .... One at night 5)  Crestor 10 Mg Tabs (Rosuvastatin calcium) .Marland Kitchen.. 1 by mouth at bedtime - due lab work in 6 weeks - stop pravachol 6)  Cialis 20 Mg Tabs (Tadalafil) .Marland Kitchen.. 1 by mouth every other day 7)  Eye Drops For Glaucoma  8)  Flexeril 10 Mg Tabs (  Cyclobenzaprine hcl) .Marland Kitchen.. 1 by mouth qhs 9)  Androgel  .... (per endo) 10)  Lisinopril 20 Mg Tabs (Lisinopril) .Marland Kitchen.. 1 by mouth once daily -- needs nurse visit in 2 weeks to check bp 11)  Vibramycin 100 Mg Caps (Doxycycline hyclate) .... One by mouth twice a day for 10 days  Patient Instructions: 1)  warm compress 2)  call anytime if the area gets worse or if you are not better in 10 days Prescriptions: VIBRAMYCIN 100 MG CAPS (DOXYCYCLINE HYCLATE) one by mouth twice a day for 10 days  #20 x 0   Entered and Authorized by:   Nolon Rod. Vanity Larsson MD   Signed by:   Nolon Rod. Rufus Cypert MD on 10/29/2009   Method used:   Electronically to        [id number] (retail)       [address]       [place], [place]  [address]       Ph: [phone]       Fax: [phone]    RxID:   [id number]

## 2010-09-15 NOTE — Assessment & Plan Note (Signed)
Summary: BP CHECK//KN  Nurse Visit   Vital Signs:  Patient profile:   65 year old male Weight:      259.13 pounds Pulse rate:   99 / minute Pulse rhythm:   regular BP sitting:   136 / 86  (left arm) Cuff size:   large  Vitals Entered By: [name] CMA (March 04, 2010 10:31 AM) CC: BP check   Current Medications (verified): 1)  Freestyle Lite   Strp (Glucose Blood) .... Check Bs Three Times A Day Dx 250.00 2)  Freestyle Lite Lancets .... Check Bs Three Times A Day Dx 250.00 3)  Baby Aspirin 81 Mg  Chew (Aspirin) .Marland Kitchen.. 1 A  Day 4)  Lunesta 1 Mg  Tabs (Eszopiclone) .... One At Night 5)  Crestor 10 Mg Tabs (Rosuvastatin Calcium) .Marland Kitchen.. 1 By Mouth At Bedtime - Due Lab Work in 6 Weeks - Stop Pravachol 6)  Cialis 20 Mg Tabs (Tadalafil) .Marland Kitchen.. 1 By Mouth Every Other Day 7)  Eye Drops For Glaucoma 8)  Androgel .... (Per Endo) 9)  Lisinopril 20 Mg Tabs (Lisinopril) .Marland Kitchen.. 1 By Mouth Once Daily -- Needs Nurse Visit in 2 Weeks To Check Bp  Allergies (verified): No Known Drug Allergies  Impression & Recommendations:  Problem # 1:  ESSENTIAL HYPERTENSION (ICD-401.9)  BP well controlled, next appointment in October for a office visit His updated medication list for this problem includes:    Lisinopril 20 Mg Tabs (Lisinopril) .Marland Kitchen... 1 by mouth once daily -- needs nurse visit in 2 weeks to check bp  BP today: 136/86 Prior BP: 100/72 (02/05/2010)  Labs Reviewed: K+: 4.5 (01/30/2010) Creat: : 0.9 (01/30/2010)   Chol: 156 (01/30/2010)   HDL: 43.30 (01/30/2010)   LDL: 101 (01/30/2010)   TG: 59.0 (01/30/2010)  Orders: No Charge Patient Arrived (NCPA0) (NCPA0)  Spoke with pt, pt is aware.  Complete Medication List: 1)  Freestyle Lite Strp (Glucose blood) .... Check bs three times a day dx 250.00 2)  Freestyle Lite Lancets  .... Check bs three times a day dx 250.00 3)  Baby Aspirin 81 Mg Chew (Aspirin) .Marland Kitchen.. 1 a  day 4)  Lunesta 1 Mg Tabs (Eszopiclone) .... One at night 5)  Crestor 10 Mg  Tabs (Rosuvastatin calcium) .Marland Kitchen.. 1 by mouth at bedtime - due lab work in 6 weeks - stop pravachol 6)  Cialis 20 Mg Tabs (Tadalafil) .Marland Kitchen.. 1 by mouth every other day 7)  Eye Drops For Glaucoma  8)  Androgel  .... (per endo) 9)  Lisinopril 20 Mg Tabs (Lisinopril) .Marland Kitchen.. 1 by mouth once daily -- needs nurse visit in 2 weeks to check bp   Orders Added: 1)  No Charge Patient Arrived (NCPA0) [NCPA0]

## 2010-09-15 NOTE — Progress Notes (Signed)
  Phone Note Call from Patient   Caller: Patient Summary of Call: pt called c/o rash right elbow oozing Mrsa again?   OV scheduled .Kandice Hams  October 28, 2009 11:42 AM  Initial call taken by: Kandice Hams,  October 28, 2009 11:42 AM

## 2010-09-15 NOTE — Letter (Signed)
Summary: Primary Care Consult Scheduled Letter  [facility]  [address] [place], [place] [address]   Phone: [phone]  Fax: [phone]      09/25/2009 MRN: [id number]  SHADOE BETHEL [address] [place], [place]  [address]    Dear Mr. Wagler,    We have scheduled an appointment for you.  At the recommendation of Dr. Willow Ora, we have scheduled you a Sleep consult with Dr. Marcelyn Bruins with [facility] on 10-06-2009 at 11:45am.  Their address is [address], [place] [address]. The office phone number is [phone].  If this appointment day and time is not convenient for you, please feel free to call the office of the doctor you are being referred to at the number listed above and reschedule the appointment.    It is important for you to keep your scheduled appointments. We are here to make sure you are given good patient care.   Thank you,    Renee, Patient Care Coordinator [facility]    **IF YOU ARE UNABLE TO KEEP THIS APPOINTMENT, OR NEED TO RESCHEDULE, PLEASE GIVE DR. CLANCE'S OFFICE A 24 HOUR NOTICE TO AVOID A $50 FEE**

## 2010-09-15 NOTE — Assessment & Plan Note (Signed)
Summary: 4 MONTH OV//PH   Vital Signs:  Patient profile:   65 year old male Weight:      272.6 pounds Pulse rate:   82 / minute BP sitting:   156 / 94  (left arm)  Vitals Entered By: Doristine Devoid (September 23, 2009 8:53 AM) CC: 4 month roa   History of Present Illness: routine office visit  Allergies: No Known Drug Allergies  Past History:  Past Medical History: Osteoarthritis--L knee Diabetes mellitus, type II Dx 2-09 Dx w/ PTSD (went to [name], has chronic diff.  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 h/o Meniscal torn L, arthroscopy Dr Jerl Santos  (1999)  Social History: Reviewed history from 01/31/2008 and no changes required. 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 qiut 1993 Alcohol Use - no Illicit Drug Use - no Patient gets regular exercise.  Review of Systems       Osteoarthritis--L knee pain still there, will try to "run this  through the [place]" concern about  his sleeping : can't  get more than 3 hours of sleep a night, he is tired in the morning for at least two hours, he is  sleepy throughout the day. (+) snoring symptoms are chronic, wonders if they are related to PTSD.   Diabetes-- ambulatory CBGs when checked < 106 diet-- not as good as before  exercise -- very little  Hyperlipidemia-- on higher chol med dose , good tolerance    Physical Exam  General:  alert and well-developed.   Lungs:  normal respiratory effort, no intercostal retractions, no accessory muscle use, and normal breath sounds.   Heart:  normal rate, regular rhythm, and no murmur.   Extremities:  no pretibial edema bilaterally    Impression & Recommendations:  Problem # 1:  SLEEP DISORDER (ICD-780.50) see review of systems chronic issues with diff.  sleeping + features  of sleep apnea the patient would like a referral, I agree  Orders: Pulmonary Referral (Pulmonary)  Problem # 2:  HYPERLIPIDEMIA (ICD-272.4) tolerating higher dose of pravachol labs His updated medication list for this problem includes:    Pravachol 40 Mg Tabs (Pravastatin sodium) .Marland Kitchen... 1 1/2 by mouth once daily  Labs Reviewed: SGOT: 28 (05/15/2009)   SGPT: 27 (05/15/2009)   HDL:39.00 (05/15/2009), 42.90 (11/18/2008)  LDL:126 (05/15/2009), 111 (11/18/2008)  Chol:176 (05/15/2009), 169 (11/18/2008)  Trig:56.0 (05/15/2009), 76.0 (11/18/2008)  Problem # 3:  DIABETES MELLITUS, TYPE II (ICD-250.00) at goal per last A1C counseled about diet His updated medication list for this problem includes:    Baby Aspirin 81 Mg Chew (Aspirin) .Marland Kitchen... 1 a  day  Problem # 4:  OSTEOARTHRITIS (ICD-715.90) continue with knee pain, plans to be evaluated at the [facility] His updated medication list for this problem includes:    Baby Aspirin 81 Mg Chew (Aspirin) .Marland Kitchen... 1 a  day  Problem # 5:  ELEVATED BLOOD PRESSURE (ICD-796.2) borderline BP elevation on and off recommend low salt diet see instructions, no charge for nurse visit   BP today: 156/94 Prior BP: 150/92 (05/15/2009)  Labs Reviewed: Creat: 0.9 (11/18/2008) Chol: 176 (05/15/2009)   HDL: 39.00 (05/15/2009)   LDL: 126 (05/15/2009)   TG: 56.0 (05/15/2009)     Complete Medication List: 1)  Freestyle Lite Strp (Glucose blood) .... Check bs three times a day dx  250.00 2)  Freestyle Lite Lancets  .... Check bs three times a day dx 250.00 3)  Baby Aspirin 81 Mg Chew (Aspirin) .Marland Kitchen.. 1 a  day 4)  Lunesta 1 Mg Tabs (Eszopiclone) .... One at night 5)  Pravachol 40 Mg Tabs (Pravastatin sodium) .Marland Kitchen.. 1 1/2 by mouth once daily 6)  Cialis 20 Mg Tabs (Tadalafil) .Marland Kitchen.. 1 by mouth every other day 7)  Eye Drops For Glaucoma  8)  Flexeril 10 Mg Tabs (Cyclobenzaprine hcl) .Marland Kitchen.. 1 by mouth qhs 9)  Androgel  .... (per endo)  Patient Instructions: 1)   come back  fasting: 2)  FLP, AST, ALT---dx  hyperlipidemia 3)  BMP ---dx DM 4)  nurse visit in 4 weeks for a BP check 5)  Please schedule a follow-up appointment in 4 months .

## 2010-09-15 NOTE — Assessment & Plan Note (Signed)
Summary: rov for osa   Visit Type:  Follow-up Copy to:  [facility] Primary Provider/Referring Provider:  Nolon Rod. Paz MD  CC:  pt here to discuss sleep study results. .  History of Present Illness: The pt comes in today for f/u of his recent sleep study.  He was found to have mild to moderate osa, with AHI of 19/hr and desat to 80%.  I have gone over the results with him in detail, and answered all of his questions.    Current Medications (verified): 1)  Freestyle Lite   Strp (Glucose Blood) .... Check Bs Three Times A Day Dx 250.00 2)  Freestyle Lite Lancets .... Check Bs Three Times A Day Dx 250.00 3)  Baby Aspirin 81 Mg  Chew (Aspirin) .Marland Kitchen.. 1 A  Day 4)  Lunesta 1 Mg  Tabs (Eszopiclone) .... One At Night 5)  Crestor 10 Mg Tabs (Rosuvastatin Calcium) .Marland Kitchen.. 1 By Mouth At Bedtime - Due Lab Work in 6 Weeks - Stop Pravachol 6)  Cialis 20 Mg Tabs (Tadalafil) .Marland Kitchen.. 1 By Mouth Every Other Day 7)  Eye Drops For Glaucoma 8)  Androgel .... (Per Endo) 9)  Lisinopril 20 Mg Tabs (Lisinopril) .Marland Kitchen.. 1 By Mouth Once Daily -- Needs Nurse Visit in 2 Weeks To Check Bp  Allergies (verified): No Known Drug Allergies  Review of Systems       The patient complains of rash.  The patient denies shortness of breath with activity, shortness of breath at rest, productive cough, non-productive cough, coughing up blood, chest pain, irregular heartbeats, acid heartburn, indigestion, loss of appetite, weight change, abdominal pain, difficulty swallowing, sore throat, tooth/dental problems, headaches, nasal congestion/difficulty breathing through nose, sneezing, itching, ear ache, anxiety, depression, hand/feet swelling, joint stiffness or pain, change in color of mucus, and fever.    Vital Signs:  Patient profile:   65 year old male Height:      71 inches Weight:      268.25 pounds BMI:     37.55 O2 Sat:      97 % on Room air Temp:     98.3 degrees F oral Pulse rate:   90 / minute BP sitting:   164 / 98  (left  arm) Cuff size:   large  Vitals Entered By: Carver Fila (July 21, 2010 10:29 AM)  O2 Flow:  Room air CC: pt here to discuss sleep study results.  Comments meds and allergies updated Phone number updated Carver Fila  July 21, 2010 10:30 AM    Physical Exam  General:  ow male in nad Nose:  no obvious discharge or purulence. Extremities:  mild edema, no cyanosis  Neurologic:  alert, appears mildly sleepy, moves all 4.   Impression & Recommendations:  Problem # 1:  OBSTRUCTIVE SLEEP APNEA (ICD-327.23) the pt has mild to moderate osa by his recent sleep study.  He is overweight and has definite EDS by history.  I have reviewed the different treatment options with him, including a trial of weight loss alone, upper airway surgery, dental appliance, and cpap.  After a discussion of each, the pt has decided to try cpap while trying to work on weight loss.  I will set the patient up on cpap at a moderate pressure level to allow for desensitization, and will troubleshoot the device over the next 4-6weeks if needed.  The pt is to call me if having issues with tolerance.  Will then optimize the pressure once patient is  able to wear cpap on a consistent basis.  Other Orders: Est. Patient Level III ([address]) DME Referral (DME)  Patient Instructions: 1)  will start on cpap.  Please call if having issues with tolerance. 2)  work on weight loss 3)  followup with me in 5 weeks.

## 2010-09-15 NOTE — Letter (Signed)
Summary: [facility]  [address]   [place], [place] [address]   Phone: [phone]  Fax: [phone]    07/10/2010  DICKEY CAAMANO [address] [place], [place]  [address]  Dear Mr. Hartsock,  We have attempted to call you multiple times and have been unable to get a hold of you. If you would please give our office a call back as soon as possible and schedule a follow up with Dr. Shelle Iron. Our phone number is [phone] and we are open from 8:00 a.m. to 5:30 p.m. Monday through Friday.          Sincerely,   [employer]

## 2010-09-15 NOTE — Assessment & Plan Note (Signed)
Summary: bp check/kdc  Nurse Visit   Vital Signs:  Patient profile:   65 year old male Height:      71 inches Weight:      276.13 pounds Pulse rate:   100 / minute Pulse rhythm:   regular BP sitting:   160 / 100  (left arm) Cuff size:   regular  Vitals Entered By: Shary Decamp (October 21, 2009 10:49 AM)  Serial Vital Signs/Assessments:  Time      Position  BP       Pulse  Resp  Temp     By 10:55 AM            134/100                        Shary Decamp   Impression & Recommendations:  Problem # 1:  ESSENTIAL HYPERTENSION (ICD-401.9)  persistently elevated BP, patient has hypertension he is diabetic please advise patient: start lisinopril 20 mg one p.o. q.d. warn  patient about side effects such as cough nurse visit in two weeks for BP check and BMP  DISCUSSED WITH PT Shary Decamp  October 22, 2009 1:03 PM  His updated medication list for this problem includes:    Lisinopril 20 Mg Tabs (Lisinopril) .Marland Kitchen... 1 by mouth once daily -- needs nurse visit in 2 weeks to check bp  Complete Medication List: 1)  Freestyle Lite Strp (Glucose blood) .... Check bs three times a day dx 250.00 2)  Freestyle Lite Lancets  .... Check bs three times a day dx 250.00 3)  Baby Aspirin 81 Mg Chew (Aspirin) .Marland Kitchen.. 1 a  day 4)  Lunesta 1 Mg Tabs (Eszopiclone) .... One at night 5)  Crestor 10 Mg Tabs (Rosuvastatin calcium) .Marland Kitchen.. 1 by mouth at bedtime - due lab work in 6 weeks - stop pravachol 6)  Cialis 20 Mg Tabs (Tadalafil) .Marland Kitchen.. 1 by mouth every other day 7)  Eye Drops For Glaucoma  8)  Flexeril 10 Mg Tabs (Cyclobenzaprine hcl) .Marland Kitchen.. 1 by mouth qhs 9)  Androgel  .... (per endo) 10)  Lisinopril 20 Mg Tabs (Lisinopril) .Marland Kitchen.. 1 by mouth once daily -- needs nurse visit in 2 weeks to check bp  Other Orders: No Charge Patient Arrived (NCPA0) (NCPA0)   Allergies: No Known Drug Allergies  Orders Added: 1)  No Charge Patient Arrived (NCPA0) [NCPA0] Prescriptions: LISINOPRIL 20 MG TABS (LISINOPRIL)  1 by mouth once daily -- NEEDS NURSE VISIT IN 2 WEEKS TO CHECK BP  #30 x 0   Entered by:   Shary Decamp   Authorized by:   Nolon Rod. Ericka Marcellus MD   Signed by:   Shary Decamp on 10/22/2009   Method used:   Electronically to        [id number] (retail)       [address]       [place], [place]  [address]       Ph: [phone]       Fax: [phone]   RxID:   [id number]

## 2010-09-17 NOTE — Progress Notes (Signed)
Summary: nos appt  Phone Note Call from Patient   Caller: [email or web address]  Call For: Heberto Sturdevant Summary of Call: Rsc nos from 1/10 to 1/31. Initial call taken by: Darletta Moll,  August 26, 2010 10:56 AM

## 2010-09-17 NOTE — Progress Notes (Signed)
Summary: nos appt  Phone Note Call from Patient   Caller: [email or web address]  Call For: Sherita Decoste Summary of Call: Rsc nos from 1/10 to 1/31. Initial call taken by: Darletta Moll,  August 26, 2010 9:35 AM

## 2010-10-01 NOTE — Assessment & Plan Note (Signed)
Summary: rov for management of osa   Copy to:  [facility] Primary Provider/Referring Provider:  Nolon Rod. Paz MD  CC:  Follow up on OSA.  Wears cpap machine every night.  Approx 6 hours per night.  Pt denies any complaints with mask or pressure but states "i just don't like it."  .  History of Present Illness: the pt comes in today for f/u of his known osa.  He was started on cpap at the last visit, and notes that he is wearing the device compliantly.  He has no issues with his mask or pressure, but feels the machine is a nuisance.  He is more than willing to continue working with the device, since he has seen definite improvement in his sleep and daytime alertness.  His pressure has yet to be optimized.  Current Medications (verified): 1)  Freestyle Lite   Strp (Glucose Blood) .... Check Bs Three Times A Day Dx 250.00 2)  Freestyle Lite Lancets .... Check Bs Three Times A Day Dx 250.00 3)  Baby Aspirin 81 Mg  Chew (Aspirin) .Marland Kitchen.. 1 A  Day 4)  Lunesta 1 Mg  Tabs (Eszopiclone) .... One At Night 5)  Crestor 10 Mg Tabs (Rosuvastatin Calcium) .Marland Kitchen.. 1 By Mouth At Bedtime - Due Lab Work in 6 Weeks - Stop Pravachol 6)  Cialis 20 Mg Tabs (Tadalafil) .Marland Kitchen.. 1 By Mouth Every Other Day 7)  Eye Drops For Glaucoma 8)  Lisinopril 20 Mg Tabs (Lisinopril) .Marland Kitchen.. 1 By Mouth Once Daily -- Needs Nurse Visit in 2 Weeks To Check Bp  Allergies (verified): No Known Drug Allergies  Past History:  Past medical, surgical, family and social histories (including risk factors) reviewed, and no changes noted (except as noted below).  Past Medical History: Reviewed history from 05/25/2010 and no changes required. Osteoarthritis--L knee Diabetes mellitus, type II Dx 2-09 eye exam 7-11, no  retinopathy, mild cataracts and glaucoma hypertension, dx  3-11 Dx w/ PTSD (went to [name], has chronic diff.  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 h/o Meniscal torn L, arthroscopy Dr Jerl Santos  (1999)  Family History: Reviewed history from 10/06/2009 and no changes required. heart disease: mother, father cancer: father (colon)   Social History: Reviewed history from 10/06/2009 and no changes required. 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 started at age 89. 2 ppd.  quit 1993.  Alcohol Use - no Illicit Drug Use - no Patient gets regular exercise.  Review of Systems       The patient complains of weight change and tooth/dental problems.  The patient denies shortness of breath with activity, shortness of breath at rest, productive cough, non-productive cough, coughing up blood, chest pain, irregular heartbeats, acid heartburn, indigestion, loss of appetite, abdominal pain, difficulty swallowing, sore throat, headaches, nasal congestion/difficulty breathing through nose, sneezing, itching, ear ache, anxiety, depression, hand/feet swelling, joint stiffness or pain, rash, change in color of mucus, and fever.    Vital Signs:  Patient profile:   65 year old male Height:      71 inches Weight:      270.13 pounds BMI:     37.81 O2 Sat:      99 % on Room air Temp:     98.1 degrees F oral Pulse  rate:   76 / minute BP sitting:   130 / 88  (right arm) Cuff size:   large  Vitals Entered By: Arman Filter LPN (September 15, 2010 9:12 AM)  O2 Flow:  Room air CC: Follow up on OSA.  Wears cpap machine every night.  Approx 6 hours per night.  Pt denies any complaints with mask or pressure but states "i just don't like it."   Comments Medications reviewed with patient Arman Filter LPN  September 15, 2010 9:12 AM    Physical Exam  General:  ow male in nad Nose:  no skin breakdown or pressure necrosis from cpap mask Heart:  rrr Extremities:  no significant edema   no cyanosis  Neurologic:  alert and oriented, moves all 4. does not appear sleepy   Impression & Recommendations:  Problem # 1:  OBSTRUCTIVE SLEEP APNEA (ICD-327.23) the pt is tolerating cpap fairly well from a mask and pressure perspective, but feels it is a nuisance.  He will continue to wear though because he feels it has helped him clinically.  I have discussed other treatment options with him, but feel cpap is his best.  We do need to optimize pressure for him.  I have also encouraged him to work on weight loss, and reminded him again this can cure his osa.   Care Plan:  At this point, will arrange for the patient's machine to be changed over to auto mode for 2 weeks to optimize their pressure.  I will review the downloaded data once sent by dme, and also evaluate for compliance, leaks, and residual osa.  I will call the patient and dme to discuss the results, and have the patient's machine set appropriately.  This will serve as the pt's cpap pressure titration.  Other Orders: Est. Patient Level IV (43329) DME Referral (DME)  Patient Instructions: 1)  will optimize pressure for you on cpap with auto mode for 2 weeks.  Will let you know the results. 2)  work on weight loss 3)  followup with me in 6mos.

## 2010-10-22 ENCOUNTER — Other Ambulatory Visit: Payer: Self-pay | Admitting: Internal Medicine

## 2010-10-22 ENCOUNTER — Encounter: Payer: Self-pay | Admitting: Internal Medicine

## 2010-10-22 ENCOUNTER — Ambulatory Visit (INDEPENDENT_AMBULATORY_CARE_PROVIDER_SITE_OTHER): Payer: 59 | Admitting: Internal Medicine

## 2010-10-22 DIAGNOSIS — E119 Type 2 diabetes mellitus without complications: Secondary | ICD-10-CM

## 2010-10-22 DIAGNOSIS — I10 Essential (primary) hypertension: Secondary | ICD-10-CM

## 2010-10-22 DIAGNOSIS — G4733 Obstructive sleep apnea (adult) (pediatric): Secondary | ICD-10-CM

## 2010-10-22 DIAGNOSIS — E785 Hyperlipidemia, unspecified: Secondary | ICD-10-CM

## 2010-10-22 DIAGNOSIS — E291 Testicular hypofunction: Secondary | ICD-10-CM

## 2010-10-22 DIAGNOSIS — M1990 Unspecified osteoarthritis, unspecified site: Secondary | ICD-10-CM

## 2010-10-22 LAB — LIPID PANEL
Cholesterol: 216 mg/dL — ABNORMAL HIGH (ref 0–200)
HDL: 48.6 mg/dL (ref >39.00)
Triglycerides: 77 mg/dL (ref 0.0–149.0)
VLDL: 15.4 mg/dL (ref 0.0–40.0)

## 2010-10-22 LAB — BASIC METABOLIC PANEL
CO2: 31 mEq/L (ref 19–32)
Chloride: 103 mEq/L (ref 96–112)
Glucose, Bld: 92 mg/dL (ref 70–99)
Sodium: 140 mEq/L (ref 135–145)

## 2010-10-22 LAB — HEMOGLOBIN A1C: Hgb A1c MFr Bld: 6.7 % — ABNORMAL HIGH (ref 4.6–6.5)

## 2010-10-22 LAB — AST: AST: 38 U/L — ABNORMAL HIGH (ref 0–37)

## 2010-10-25 ENCOUNTER — Telehealth: Payer: Self-pay | Admitting: Internal Medicine

## 2010-10-27 NOTE — Assessment & Plan Note (Signed)
Summary: rto 5 months/cbs   Vital Signs:  Patient profile:   65 year old male Weight:      270.13 pounds Pulse rate:   70 / minute Pulse rhythm:   regular BP sitting:   136 / 84  (left arm) Cuff size:   large  Vitals Entered By: [name] CMA (October 22, 2010 9:08 AM) CC: 5 month f/u- fsating  Comments discuss (B) knees and Hip pain- referral to ortho [facility] [address] refill lisinopril and crestor    History of Present Illness: ROV Osteoarthritis-- complaining  R hip and B knee  pain, needs ortho referral    sleep apnea, on CPAP,  notes from pulmonary reviewed  Diabetes-- admits to poor diet, ambulatory CBGs  ~ 120   hypertension--  ambulatory BPs  ~ 130/80 primary hypogonadism-- untreated, at some point saw DR Lisabeth Devoid  Current Medications (verified): 1)  Freestyle Lite   Strp (Glucose Blood) .... Check Bs Three Times A Day Dx 250.00 2)  Freestyle Lite Lancets .... Check Bs Three Times A Day Dx 250.00 3)  Baby Aspirin 81 Mg  Chew (Aspirin) .Marland Kitchen.. 1 A  Day 4)  Lunesta 1 Mg  Tabs (Eszopiclone) .... One At Night 5)  Crestor 10 Mg Tabs (Rosuvastatin Calcium) .Marland Kitchen.. 1 By Mouth At Bedtime - Due Lab Work in 6 Weeks - Stop Pravachol 6)  Cialis 20 Mg Tabs (Tadalafil) .Marland Kitchen.. 1 By Mouth Every Other Day 7)  Eye Drops For Glaucoma 8)  Lisinopril 20 Mg Tabs (Lisinopril) .Marland Kitchen.. 1 By Mouth Once Daily -- Needs Nurse Visit in 2 Weeks To Check Bp  Allergies (verified): No Known Drug Allergies  Past History:  Past Medical History: Reviewed history from 05/25/2010 and no changes required. Osteoarthritis--L knee Diabetes mellitus, type II Dx 2-09 eye exam 7-11, no  retinopathy, mild cataracts and glaucoma hypertension, dx  3-11 Dx w/ PTSD (went to [name], has chronic diff.  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 h/o Meniscal torn L, arthroscopy  Dr Jerl Santos  (1999)  Social History: Reviewed history from 10/06/2009 and no changes required. 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 started at age 69. 2 ppd.  quit 1993.  Alcohol Use - no Illicit Drug Use - no Patient gets regular exercise.  Review of Systems CV:  Denies chest pain or discomfort, palpitations, and swelling of feet. Resp:  Denies cough and shortness of breath. GI:  Denies diarrhea, nausea, and vomiting.  Physical Exam  General:  alert and well-developed.    weight stable Lungs:  normal respiratory effort, no intercostal retractions, no accessory muscle use, and normal breath sounds.   Heart:  normal rate, regular rhythm, and no murmur.   Extremities:  no pretibial edema bilaterally    Impression & Recommendations:  Problem # 1:  OBSTRUCTIVE SLEEP APNEA (ICD-327.23) per pulmonary, on a Cpap, lately having issues w/ dyscomfort w/ "the machine"  Problem # 2:  ESSENTIAL HYPERTENSION (ICD-401.9)  no change  His updated medication list for this problem includes:    Lisinopril 20 Mg Tabs (Lisinopril) .Marland Kitchen... 1 by mouth once daily -- needs nurse visit in 2 weeks to check bp  BP today: 136/84 Prior BP: 130/88 (09/15/2010)  Labs Reviewed: K+: 4.5 (01/30/2010) Creat: : 0.9 (01/30/2010)  Chol: 156 (01/30/2010)   HDL: 43.30 (01/30/2010)   LDL: 101 (01/30/2010)   TG: 59.0 (01/30/2010)  Orders: TLB-BMP (Basic Metabolic Panel-BMET) (80048-METABOL) Specimen Handling (40347)  Problem # 3:  HYPOGONADISM, MALE (ICD-257.2) untreated, at some point saw DR Lisabeth Devoid---- labs   Problem # 4:  HYPERLIPIDEMIA (ICD-272.4)  His updated medication list for this problem includes:    Crestor 10 Mg Tabs (Rosuvastatin calcium) .Marland Kitchen... 1 by mouth at bedtime - due lab work in 6 weeks - stop pravachol  Orders: TLB-ALT (SGPT) (84460-ALT) TLB-AST (SGOT) (84450-SGOT) TLB-Lipid  Panel (80061-LIPID) Specimen Handling (42595)  Problem # 5:  DIABETES MELLITUS, TYPE II (ICD-250.00) diet encouraged!! labs  His updated medication list for this problem includes:    Baby Aspirin 81 Mg Chew (Aspirin) .Marland Kitchen... 1 a  day    Lisinopril 20 Mg Tabs (Lisinopril) .Marland Kitchen... 1 by mouth once daily -- needs nurse visit in 2 weeks to check bp  Orders: Venipuncture (63875) TLB-A1C / Hgb A1C (Glycohemoglobin) (83036-A1C) Specimen Handling (64332)  Problem # 6:  OSTEOARTHRITIS (ICD-715.90)  refer to ortho  His updated medication list for this problem includes:    Baby Aspirin 81 Mg Chew (Aspirin) .Marland Kitchen... 1 a  day  Orders: Orthopedic Referral (Ortho)  Complete Medication List: 1)  Freestyle Lite Strp (Glucose blood) .... Check bs three times a day dx 250.00 2)  Freestyle Lite Lancets  .... Check bs three times a day dx 250.00 3)  Baby Aspirin 81 Mg Chew (Aspirin) .Marland Kitchen.. 1 a  day 4)  Lunesta 1 Mg Tabs (Eszopiclone) .... One at night 5)  Crestor 10 Mg Tabs (Rosuvastatin calcium) .Marland Kitchen.. 1 by mouth at bedtime - due lab work in 6 weeks - stop pravachol 6)  Cialis 20 Mg Tabs (Tadalafil) .Marland Kitchen.. 1 by mouth every other day 7)  Eye Drops For Glaucoma  8)  Lisinopril 20 Mg Tabs (Lisinopril) .Marland Kitchen.. 1 by mouth once daily -- needs nurse visit in 2 weeks to check bp  Other Orders: T- * Misc. Laboratory test [phone])  Patient Instructions: 1)  Please schedule a follow-up appointment in 4 months .Fasting , physical exam    Orders Added: 1)  Venipuncture [36415] 2)  TLB-A1C / Hgb A1C (Glycohemoglobin) [83036-A1C] 3)  TLB-BMP (Basic Metabolic Panel-BMET) [80048-METABOL] 4)  TLB-ALT (SGPT) [84460-ALT] 5)  TLB-AST (SGOT) [84450-SGOT] 6)  TLB-Lipid Panel [80061-LIPID] 7)  Specimen Handling [99000] 8)  T- * Misc. Laboratory test [99999] 9)  Est. Patient Level IV [41660] 10)  Orthopedic Referral [Ortho]

## 2010-10-29 ENCOUNTER — Encounter (INDEPENDENT_AMBULATORY_CARE_PROVIDER_SITE_OTHER): Payer: Self-pay | Admitting: *Deleted

## 2010-11-03 NOTE — Letter (Signed)
Summary: Unable To Reach-Consult Scheduled  [facility] [place]  [address] [place], [place] [address]   Phone: [phone]  Fax: [phone]    10/29/2010 MRN: [id number]    Dear William Stokes,   We have been unable to reach you by phone.  Please contact our office with an updated phone number.  If you have any question please call us.     Thank you,  [name] CMA  October 29, 2010 4:21 PM

## 2010-11-03 NOTE — Progress Notes (Signed)
Summary: lab results (lmom 3/12,3/13,314)  Phone Note Outgoing Call   Summary of Call: advise patient:  diabetes is well controlled chol.  needs improvement, increase Crestor from 10 to  20 mg daily  testosterone continue to be low , rec to start androgel 1.62% 2 pumps once daily , recheck testosterone in 6 weeks If  he is unwilling to start testosterone or unable to afford it, then recommend  a bone density test, he is at risk for osteoporosis Katisha Shimizu E. Jyden Kromer MD  October 25, 2010 5:44 PM   Follow-up for Phone Call        Left message w/ a femaile at the home # to have call back. [name] CMA  October 26, 2010 10:06 AM   Additional Follow-up for Phone Call Additional follow up Details #1::        left message for pt to call back. [name] CMA  October 27, 2010 1:51 PM     Additional Follow-up for Phone Call Additional follow up Details #2::    left message for pt to call back. [name] CMA  October 28, 2010 1:16 PM     Additional Follow-up for Phone Call Additional follow up Details #3:: Details for Additional Follow-up Action Taken: Unable to reach pt-- do you want me to mail a letter and results? Or letter results and a rx? [name] CMA  October 29, 2010 9:57 AM  yes please  Nolon Rod. Keeya Dyckman MD  October 29, 2010 1:21 PM   mailed letter and results w/ rx. [name] CMA  October 29, 2010 4:20 PM   New/Updated Medications: ANDROGEL PUMP 20.25 MG/ACT (1.62%) GEL (TESTOSTERONE) 2 pumps once daily. Prescriptions: ANDROGEL PUMP 20.25 MG/ACT (1.62%) GEL (TESTOSTERONE) 2 pumps once daily.  #1 x 1   Entered by:   [name] CMA   Authorized by:   Nolon Rod. Teria Khachatryan MD   Signed by:   [name] CMA on 10/29/2010   Method used:   Print then Mail to Patient   RxID:   [id number]   Appended Document: lab results (lmom 3/12,3/13,314)    Clinical Lists Changes  Medications: Changed medication from CRESTOR 10 MG TABS (ROSUVASTATIN CALCIUM) 1 by mouth at  bedtime - DUE LAB WORK IN 6 WEEKS - STOP PRAVACHOL to CRESTOR 20 MG TABS (ROSUVASTATIN CALCIUM) 1 by mouth once daily. - Signed Rx of CRESTOR 20 MG TABS (ROSUVASTATIN CALCIUM) 1 by mouth once daily.;  #30 x 1;  Signed;  Entered by: [name] CMA;  Authorized by: Nolon Rod Kinjal Neitzke MD;  Method used: Print then Mail to Patient    Prescriptions: CRESTOR 20 MG TABS (ROSUVASTATIN CALCIUM) 1 by mouth once daily.  #30 x 1   Entered by:   [name] CMA   Authorized by:   Nolon Rod. Kelcee Bjorn MD   Signed by:   [name] CMA on 10/29/2010   Method used:   Print then Mail to Patient   RxID:   [id number]

## 2010-11-04 ENCOUNTER — Telehealth: Payer: Self-pay | Admitting: Pulmonary Disease

## 2010-11-04 ENCOUNTER — Telehealth: Payer: Self-pay | Admitting: *Deleted

## 2010-11-04 NOTE — Telephone Encounter (Signed)
Phone note accidentally signed. See new phone note created. Carron Curie, MA

## 2010-11-04 NOTE — Telephone Encounter (Signed)
Pt using cpap according to wife but no data shows up on download [employer]

## 2010-11-04 NOTE — Telephone Encounter (Signed)
I called patient at home number [phone] to ask patient what concerns/issues he is having with is CPAP machine; LMTCB.

## 2010-11-04 NOTE — Telephone Encounter (Signed)
11/04/2010 7:39 AM  Phone (Incoming)  [email or web address]   [phone]   pt's cpap download had nothing on it and wife says he is using it and not having any problems do you want them to do anything else      I spoke with courtney at [facility] and she states the pt pressure was changed in Feb in order for him to get a download but when they received the download there was no data, so Toni Amend called the pt and spoke with his wife and she states the pt did not use his machine at all.   So I called the pt to ask what issues he was having with the machine that would make him not wear it. [id number]. Carron Curie, MA

## 2010-11-05 NOTE — Telephone Encounter (Signed)
Called and spoke with pt and he states he did not wear his cpap when they did the download. Pt states [facility] is going to do another download and he is going to wear it this time. Will send to [facility] so they can get [facility] to do another download   [place], [place]

## 2010-11-06 NOTE — Telephone Encounter (Signed)
Spoke to [facility] pt will be retested on cpap auto with download in 2wks

## 2010-11-13 ENCOUNTER — Emergency Department (HOSPITAL_BASED_OUTPATIENT_CLINIC_OR_DEPARTMENT_OTHER)
Admission: EM | Admit: 2010-11-13 | Discharge: 2010-11-14 | Disposition: A | Discharge status: Home / Self Care | Payer: 59 | Attending: Emergency Medicine | Admitting: Emergency Medicine

## 2010-11-13 ENCOUNTER — Emergency Department (INDEPENDENT_AMBULATORY_CARE_PROVIDER_SITE_OTHER): Payer: 59

## 2010-11-13 DIAGNOSIS — I2510 Atherosclerotic heart disease of native coronary artery without angina pectoris: Secondary | ICD-10-CM | POA: Insufficient documentation

## 2010-11-13 DIAGNOSIS — R61 Generalized hyperhidrosis: Secondary | ICD-10-CM | POA: Insufficient documentation

## 2010-11-13 DIAGNOSIS — K219 Gastro-esophageal reflux disease without esophagitis: Secondary | ICD-10-CM | POA: Insufficient documentation

## 2010-11-13 DIAGNOSIS — E119 Type 2 diabetes mellitus without complications: Secondary | ICD-10-CM | POA: Insufficient documentation

## 2010-11-13 DIAGNOSIS — Z79899 Other long term (current) drug therapy: Secondary | ICD-10-CM | POA: Insufficient documentation

## 2010-11-13 DIAGNOSIS — I959 Hypotension, unspecified: Secondary | ICD-10-CM

## 2010-11-13 DIAGNOSIS — R55 Syncope and collapse: Secondary | ICD-10-CM | POA: Insufficient documentation

## 2010-11-13 DIAGNOSIS — I10 Essential (primary) hypertension: Secondary | ICD-10-CM | POA: Insufficient documentation

## 2010-11-13 LAB — URINALYSIS, ROUTINE W REFLEX MICROSCOPIC
Glucose, UA: NEGATIVE mg/dL
Leukocytes, UA: NEGATIVE
Nitrite: NEGATIVE
pH: 6 (ref 5.0–8.0)

## 2010-11-13 LAB — DIFFERENTIAL
Basophils Relative: 0 % (ref 0–1)
Lymphocytes Relative: 15 % (ref 12–46)
Lymphs Abs: 1.7 10*3/uL (ref 0.7–4.0)
Monocytes Relative: 9 % (ref 3–12)
Neutro Abs: 8.4 10*3/uL — ABNORMAL HIGH (ref 1.7–7.7)
Neutrophils Relative %: 75 % (ref 43–77)

## 2010-11-13 LAB — BASIC METABOLIC PANEL
BUN: 24 mg/dL — ABNORMAL HIGH (ref 6–23)
CO2: 27 mEq/L (ref 19–32)
Chloride: 101 mEq/L (ref 96–112)
Creatinine, Ser: 1 mg/dL (ref 0.4–1.5)
Glucose, Bld: 98 mg/dL (ref 70–99)
Potassium: 4.5 mEq/L (ref 3.5–5.1)

## 2010-11-13 LAB — CBC
HCT: 38.5 % — ABNORMAL LOW (ref 39.0–52.0)
Hemoglobin: 13.3 g/dL (ref 13.0–17.0)
MCH: 28.2 pg (ref 26.0–34.0)
MCV: 81.6 fL (ref 78.0–100.0)
RBC: 4.72 MIL/uL (ref 4.22–5.81)
WBC: 11.3 10*3/uL — ABNORMAL HIGH (ref 4.0–10.5)

## 2010-11-13 LAB — POCT CARDIAC MARKERS: Troponin i, poc: <0.05 ng/mL (ref 0.00–0.09)

## 2010-11-13 LAB — URINE MICROSCOPIC-ADD ON

## 2010-11-14 LAB — POCT CARDIAC MARKERS
CKMB, poc: 6.1 ng/mL (ref 1.0–8.0)
Troponin i, poc: <0.05 ng/mL (ref 0.00–0.09)

## 2010-11-14 LAB — GLUCOSE, CAPILLARY

## 2010-11-17 ENCOUNTER — Encounter: Payer: Self-pay | Admitting: Internal Medicine

## 2010-11-17 ENCOUNTER — Ambulatory Visit (INDEPENDENT_AMBULATORY_CARE_PROVIDER_SITE_OTHER): Payer: 59 | Admitting: Internal Medicine

## 2010-11-17 DIAGNOSIS — R3129 Other microscopic hematuria: Secondary | ICD-10-CM

## 2010-11-17 DIAGNOSIS — E291 Testicular hypofunction: Secondary | ICD-10-CM

## 2010-11-17 DIAGNOSIS — E785 Hyperlipidemia, unspecified: Secondary | ICD-10-CM

## 2010-11-17 DIAGNOSIS — I10 Essential (primary) hypertension: Secondary | ICD-10-CM

## 2010-11-17 DIAGNOSIS — N390 Urinary tract infection, site not specified: Secondary | ICD-10-CM

## 2010-11-17 LAB — POCT URINALYSIS DIPSTICK
Ketones, UA: NEGATIVE
Protein, UA: NEGATIVE
Spec Grav, UA: 1.02
Urobilinogen, UA: 0.2
pH, UA: 6

## 2010-11-17 NOTE — Assessment & Plan Note (Addendum)
We did labs earlier this month, his testosterone was low, I recommend  HRT, he started androgel. No problems so far, See instructions

## 2010-11-17 NOTE — Progress Notes (Signed)
  Subjective:    Patient ID: William Stokes, male    DOB: 1945-10-12, 65 y.o.   MRN: [id number]  HPI  Patient was eval @ the emergency room 11-14-10 due to sudden onset of leg cramps, diaphoresis, weakness. Symptoms happen immediately after he woke up from a nap, he couldn't recognize his wife for a few seconds. Symptoms lasted 15 minutes and self resolved. The symptoms have not come back. He is feeling well now. ER records reviewed  CBC was normal except for a white count of 11.3. BMP normal, cardiac enzymes normal, chest x-ray with no acute findings, urinalysis showed sugar and some blood., EKG unremarkable  Past Medical History  Diagnosis Date  . Osteoarthritis     left knee  . Diabetes mellitus 2/09  . Hypertension 3/11  . PTSD (post-traumatic stress disorder)     went to [name], has chronic diff. sleeping  . Hyperlipidemia   . Glaucoma   . Primary male hypogonadism    Past Surgical History  Procedure Date  . Knee arthroscopy 1999    meniscal torn L, Dr.Dalldorf    Review of Systems No chest pain or shortness of breath No headache, slurred speech, diplopia No nausea, vomiting, diarrhea No lost of consciousness No seizure activity No gross hematuria, dysuria or difficulty urinating    Objective:   Physical Exam  Constitutional: He is oriented to person, place, and time. He appears well-developed.       Slightly overweight  HENT:  Head: Normocephalic.  Eyes: Conjunctivae and EOM are normal.        pupils hyporeactive , uses drops for glaucoma  Neck: Normal range of motion.  Cardiovascular: Normal rate, regular rhythm and normal heart sounds.   Pulmonary/Chest: Effort normal and breath sounds normal. No respiratory distress. He has no wheezes. He has no rales.  Musculoskeletal: He exhibits no edema.  Neurological: He is alert and oriented to person, place, and time. No cranial nerve deficit. He exhibits normal muscle tone. Coordination normal.       Strength symmetric  on the extremities          Assessment & Plan:  Today , I spent more than 25  min with the patient, >50% of the time  reviewing the chart and labs ordered by other providers  The patient had episode of weakness and leg cramps of unclear etiology, labs were essentially normal, he is completely back to baseline. Plan: Observation, patient will call if symptoms resurface

## 2010-11-17 NOTE — Patient Instructions (Signed)
Came back in 1 months for labs only: testosterone, dx hypogonadism Keep the appointment you already have with me in July

## 2010-11-17 NOTE — Assessment & Plan Note (Signed)
In the emergency room, he was found to have microhematuria, no culture was done to my knowledge. He is asymptomatic We'll check a urinalysis and a urine culture, treat if appropriate

## 2010-11-17 NOTE — Assessment & Plan Note (Addendum)
Well-controlled, continue same medicines

## 2010-11-17 NOTE — Assessment & Plan Note (Addendum)
Based on the last cholesterol panel, I recommended to increase Crestor from 10 mg to 20 mg. He is taking the larger dose, other than the cramps dew days ago he has no myalgias

## 2010-11-19 LAB — URINE CULTURE: Colony Count: 6000

## 2010-11-26 ENCOUNTER — Telehealth: Payer: Self-pay | Admitting: *Deleted

## 2010-11-26 NOTE — Telephone Encounter (Addendum)
Received fax from pharmacy insurance request alternative testim. Please give dose and quantity.

## 2010-11-27 MED ORDER — TESTOSTERONE 50 MG/5GM (1%) TD GEL: 5.0000 g | Freq: Every day | TRANSDERMAL | Status: DC

## 2010-11-27 NOTE — Telephone Encounter (Signed)
Spoke w/ pt aware of information.  

## 2010-11-27 NOTE — Telephone Encounter (Signed)
Advise  patient His insurance is requesting to change from AndroGel to Testim. I have no problems with that as long as he is willing to change. A new prescription for Testim was sent. Needs a testosterone level checked in 6 weeks, please arrange

## 2010-12-09 ENCOUNTER — Ambulatory Visit (INDEPENDENT_AMBULATORY_CARE_PROVIDER_SITE_OTHER): Payer: 59 | Admitting: Internal Medicine

## 2010-12-09 ENCOUNTER — Encounter: Payer: Self-pay | Admitting: *Deleted

## 2010-12-09 ENCOUNTER — Ambulatory Visit (INDEPENDENT_AMBULATORY_CARE_PROVIDER_SITE_OTHER)
Admission: RE | Admit: 2010-12-09 | Discharge: 2010-12-09 | Disposition: A | Discharge status: Home / Self Care | Payer: 59 | Source: Ambulatory Visit | Attending: Internal Medicine | Admitting: Internal Medicine

## 2010-12-09 ENCOUNTER — Encounter: Payer: Self-pay | Admitting: Internal Medicine

## 2010-12-09 VITALS — BP 116/80 | HR 90 | Temp 98.0°F | Wt 252.6 lb

## 2010-12-09 DIAGNOSIS — R350 Frequency of micturition: Secondary | ICD-10-CM

## 2010-12-09 DIAGNOSIS — R509 Fever, unspecified: Secondary | ICD-10-CM | POA: Insufficient documentation

## 2010-12-09 LAB — POCT URINALYSIS DIPSTICK
Blood, UA: NEGATIVE
Glucose, UA: NEGATIVE
Ketones, UA: NEGATIVE
Spec Grav, UA: 1.015

## 2010-12-09 NOTE — Progress Notes (Signed)
  Subjective:    Patient ID: William Stokes, male    DOB: Jun 29, 1946, 65 y.o.   MRN: [id number]  HPI  Here with his wife. 2 days history of on-and-off fever, fever decreased with Aleve. In addition to fever, he also has a mild headache that is more noticeable when the fever is high. He also has developed urinary frequency and urgency. States that goes to the bathroom almost on the hour. On further questioning, he reports a UTI one time in his life about 10 years ago.  Past Medical History  Diagnosis Date  . Osteoarthritis     left knee  . Diabetes mellitus 2/09  . Hypertension 3/11  . PTSD (post-traumatic stress disorder)     went to [name], has chronic diff. sleeping  . Hyperlipidemia   . Glaucoma   . Primary male hypogonadism    Past Surgical History  Procedure Date  . Knee arthroscopy 1999    meniscal torn L, Dr.Dalldorf    Review of Systems  No nodular vomiting. No diarrhea but stools have been soft today. No blood in the stools. No abdominal or flank pain. Denies dysuria, difficulty urinating or penile discharge. No cough or chest congestion, denies sore throat/runny nose/sinus pressure. No rash anywhere. He does feel fatigued and sleepy.     Objective:   Physical Exam  Constitutional: He appears well-developed and well-nourished. No distress.  HENT:  Right Ear: External ear normal.  Left Ear: External ear normal.  Mouth/Throat: No oropharyngeal exudate.  Eyes: No scleral icterus.  Cardiovascular: Normal rate, regular rhythm and normal heart sounds.   No murmur heard. Pulmonary/Chest: Effort normal and breath sounds normal. No respiratory distress. He has no wheezes. He has no rales.  Abdominal: Soft. Bowel sounds are normal. He exhibits no distension. There is no tenderness. There is no rebound and no guarding.  Genitourinary: Rectum normal.       Prostate is normal size and not particularly tender.  Musculoskeletal: He exhibits no edema.  Skin: He is not  diaphoretic.          Assessment & Plan:

## 2010-12-09 NOTE — Assessment & Plan Note (Addendum)
2 days history of fever, the only additionalo symptoms are frequency/urgency and a headache. Patient does not look toxic. Urinalysis was normal Plan: CBC CXR UCX See instructions If sx continue, consider further w/u ; checking a PSA and empiric abx (Prostatitis?)

## 2010-12-09 NOTE — Patient Instructions (Signed)
Rest, fluids, tylenol or motrin Call if fever persist > 3 days Call if headache severe, cough, stomach pain, or if you develop a rash

## 2010-12-11 LAB — CBC WITH DIFFERENTIAL/PLATELET
Basophils Absolute: 0.1 10*3/uL (ref 0.0–0.1)
Basophils Relative: 2.4 % (ref 0.0–3.0)
Eosinophils Relative: 3.2 % (ref 0.0–5.0)
HCT: 41.3 % (ref 39.0–52.0)
Hemoglobin: 13.9 g/dL (ref 13.0–17.0)
Lymphocytes Relative: 59.3 % — ABNORMAL HIGH (ref 12.0–46.0)
Lymphs Abs: 2.4 10*3/uL (ref 0.7–4.0)
Monocytes Relative: 20.9 % — ABNORMAL HIGH (ref 3.0–12.0)
Neutro Abs: 0.6 10*3/uL — ABNORMAL LOW (ref 1.4–7.7)
RBC: 4.82 Mil/uL (ref 4.22–5.81)
RDW: 14.9 % — ABNORMAL HIGH (ref 11.5–14.6)
WBC: 4.1 10*3/uL — ABNORMAL LOW (ref 4.5–10.5)

## 2010-12-11 LAB — CULTURE, URINE COMPREHENSIVE
Colony Count: NO GROWTH
Organism ID, Bacteria: NO GROWTH

## 2010-12-14 ENCOUNTER — Telehealth: Payer: Self-pay | Admitting: *Deleted

## 2010-12-14 NOTE — Telephone Encounter (Signed)
thanks

## 2010-12-14 NOTE — Telephone Encounter (Signed)
Message copied by [name] on Mon Dec 14, 2010 10:44 AM ------      Message from: Willow Ora      Created: Sun Dec 13, 2010  3:09 PM       Advise patient, chest x-ray was okay, urine culture negative.      How is he doing? still having fevers or other symptoms?

## 2010-12-14 NOTE — Telephone Encounter (Signed)
Message left for patient to return my call.  

## 2010-12-14 NOTE — Telephone Encounter (Signed)
I spoke w/ pts wife and she states that he is doing better! Back to himself.

## 2010-12-17 ENCOUNTER — Other Ambulatory Visit (INDEPENDENT_AMBULATORY_CARE_PROVIDER_SITE_OTHER): Payer: 59

## 2010-12-17 DIAGNOSIS — E291 Testicular hypofunction: Secondary | ICD-10-CM

## 2010-12-18 ENCOUNTER — Telehealth: Payer: Self-pay | Admitting: Internal Medicine

## 2010-12-18 NOTE — Telephone Encounter (Signed)
Please advise 

## 2010-12-18 NOTE — Telephone Encounter (Signed)
Patient needs refill for Cialis called into [facility] on [address]

## 2010-12-21 ENCOUNTER — Telehealth: Payer: Self-pay | Admitting: *Deleted

## 2010-12-21 MED ORDER — TADALAFIL 20 MG PO TABS: 20.0000 mg | ORAL_TABLET | ORAL | Status: DC

## 2010-12-21 NOTE — Telephone Encounter (Signed)
Okay to refill Cialis #10, 3 refill

## 2010-12-21 NOTE — Telephone Encounter (Signed)
Message copied by [name] on Mon Dec 21, 2010 10:12 AM ------      Message from: Willow Ora      Created: Sat Dec 19, 2010  4:58 PM       Advise patient:      Increase Testim from 5gm to 10gm a day      Testosterone in 6 weeks

## 2010-12-21 NOTE — Telephone Encounter (Signed)
I spoke w/ pt he is aware.  

## 2011-02-23 ENCOUNTER — Ambulatory Visit (INDEPENDENT_AMBULATORY_CARE_PROVIDER_SITE_OTHER): Payer: 59 | Admitting: Internal Medicine

## 2011-02-23 ENCOUNTER — Encounter: Payer: Self-pay | Admitting: Internal Medicine

## 2011-02-23 DIAGNOSIS — E291 Testicular hypofunction: Secondary | ICD-10-CM

## 2011-02-23 DIAGNOSIS — E785 Hyperlipidemia, unspecified: Secondary | ICD-10-CM

## 2011-02-23 DIAGNOSIS — K219 Gastro-esophageal reflux disease without esophagitis: Secondary | ICD-10-CM | POA: Insufficient documentation

## 2011-02-23 DIAGNOSIS — Z0000 Encounter for general adult medical examination without abnormal findings: Secondary | ICD-10-CM

## 2011-02-23 DIAGNOSIS — E119 Type 2 diabetes mellitus without complications: Secondary | ICD-10-CM

## 2011-02-23 LAB — LIPID PANEL
Cholesterol: 197 mg/dL (ref 0–200)
Triglycerides: 77 mg/dL (ref 0.0–149.0)
VLDL: 15.4 mg/dL (ref 0.0–40.0)

## 2011-02-23 LAB — TESTOSTERONE: Testosterone: 177.62 ng/dL — ABNORMAL LOW (ref 350.00–890.00)

## 2011-02-23 LAB — HEPATIC FUNCTION PANEL
ALT: 20 U/L (ref 0–53)
Albumin: 4.5 g/dL (ref 3.5–5.2)
Bilirubin, Direct: 0.1 mg/dL (ref 0.0–0.3)
Total Protein: 7.4 g/dL (ref 6.0–8.3)

## 2011-02-23 NOTE — Progress Notes (Signed)
  Subjective:    Patient ID: William Stokes, male    DOB: 1946-02-04, 65 y.o.   MRN: [id number]  HPI CPX  in addition we discussed the following: 1 week history of GERD symptoms described as burning in the chest, from the  epigastric area to the throat  before and after eating. Symptoms decreased by drinking milk. Symptoms are not exertional. Diabetes-  blood sugars in the low to 110s Hypogonadism-recently we increased Testim from 5-10 mg, good compliance. sleep apnea- he uses his CPAP every night but admits that is hard to keep it on all night long. His energy level is okay and actually has improved lately (related to increases Testim?)   Past Medical History  Diagnosis Date  . Osteoarthritis     left knee  . Diabetes mellitus 2/09  . Hypertension 3/11  . PTSD (post-traumatic stress disorder)     went to [name], has chronic diff. sleeping  . Hyperlipidemia   . Glaucoma   . Primary male hypogonadism   . LAFB (left anterior fascicular block)     per baseline EKG 2009  . Erectile dysfunction     Past Surgical History  Procedure Date  . 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 qiut 1993 Alcohol Use - no Illicit Drug Use - no Can't exercise  Regularly d/t knee problems  .   Review of Systems No chest pain or shortness of breath No nausea, vomiting; occasionally has diarrhea, no blood in the stools No dysuria or gross hematuria No blood in the urine. No anxiety or depression.    Objective:   Physical Exam  Constitutional: He is oriented to person, place, and time. He appears well-developed. No distress.       Overweight appearing  Neck: No thyromegaly present.       Palpable  carotid pulses bilaterally  Cardiovascular: Normal rate, regular rhythm and normal heart sounds.   No murmur heard. Pulmonary/Chest: Effort normal and breath sounds normal. No respiratory distress. He has no wheezes.  Abdominal: Soft. Bowel sounds are normal. He exhibits no distension. There is no tenderness. There is no rebound and no guarding.  Genitourinary: Rectum normal.       Prostate exam limited by patient's habitus but normal  Musculoskeletal: He exhibits no edema.  Neurological: He is alert and oriented to person, place, and time.  Skin: Skin is warm and dry. He is not diaphoretic.  Psychiatric: He has a normal mood and affect. His behavior is normal. Judgment and thought content normal.          Assessment & Plan:

## 2011-02-23 NOTE — Assessment & Plan Note (Addendum)
Testosterone recently increased from 5 to 10 mg. Recheck labs.

## 2011-02-23 NOTE — Assessment & Plan Note (Signed)
Due for labs

## 2011-02-23 NOTE — Assessment & Plan Note (Signed)
One-week history of symptoms consistent with GERD, Prilosec daily for 6 weeks, see instructions. If  features of the pain change, patient knows to call me

## 2011-02-23 NOTE — Assessment & Plan Note (Signed)
On Crestor 20 mg, check labs

## 2011-02-23 NOTE — Assessment & Plan Note (Addendum)
Td 2010  Diet and exercise discussed Labs including a PSA Colonoscopy:   [facility].  (02/28/2008) , tubular adenomas, next 02-2011---------------> refer to GI

## 2011-02-23 NOTE — Patient Instructions (Signed)
Prilosec 20 mg one by mouth every morning before breakfast for 6 weeks. If your acid reflux symptoms persist, get worse or change, let me know

## 2011-03-01 ENCOUNTER — Telehealth: Payer: Self-pay | Admitting: *Deleted

## 2011-03-01 NOTE — Telephone Encounter (Signed)
Message left for patient to return my call.  

## 2011-03-01 NOTE — Telephone Encounter (Signed)
Message copied by Leanne Lovely on Mon Mar 01, 2011  2:57 PM ------      Message from: William Stokes      Created: Sun Feb 28, 2011 11:16 AM       Advise patient:      DM well controlled      Cholesterol needs improvement, increase crestor from 20 to 40mg  qd, call a new Rx      Needs more testosterone, increase from 10 to 20mg  daily       F/u 4 months as planned       psa normal

## 2011-03-02 MED ORDER — ROSUVASTATIN CALCIUM 40 MG PO TABS: 40.0000 mg | ORAL_TABLET | Freq: Every day | ORAL | Status: DC

## 2011-03-02 NOTE — Telephone Encounter (Signed)
I spoke w/ pt he is aware. New rx sent.

## 2011-03-16 ENCOUNTER — Ambulatory Visit (INDEPENDENT_AMBULATORY_CARE_PROVIDER_SITE_OTHER): Payer: 59 | Admitting: Pulmonary Disease

## 2011-03-16 ENCOUNTER — Encounter: Payer: Self-pay | Admitting: Pulmonary Disease

## 2011-03-16 VITALS — BP 112/70 | HR 97 | Temp 98.2°F | Ht 71.0 in | Wt 262.0 lb

## 2011-03-16 DIAGNOSIS — G4733 Obstructive sleep apnea (adult) (pediatric): Secondary | ICD-10-CM

## 2011-03-16 NOTE — Patient Instructions (Signed)
Will try you on a nasal pillows setup with chin strap.  Please call me if this is not working out. Work on weight loss followup with me in 6mos, but will get download off your machine in about 6weeks to see how things are going.

## 2011-03-16 NOTE — Progress Notes (Signed)
  Subjective:    Patient ID: William Stokes, male    DOB: 01-09-1946, 65 y.o.   MRN: [id number]  HPI The pt comes in today for f/u of his osa.  He is not wearing cpap consistently, and less than 4 hrs on his download.  He has no issues with the pressure, but is having issues with "something on my face".  He is using a full face mask currently, but has not tried nasal pillows.  When he wears the device more consistently, he feels he sleeps much better.     Review of Systems  Constitutional: Negative for fever and unexpected weight change.  HENT: Negative for ear pain, nosebleeds, congestion, sore throat, rhinorrhea, sneezing, trouble swallowing, dental problem, postnasal drip and sinus pressure.   Eyes: Negative for redness and itching.  Respiratory: Negative for cough, chest tightness, shortness of breath and wheezing.   Cardiovascular: Negative for palpitations and leg swelling.  Gastrointestinal: Negative for nausea and vomiting.  Genitourinary: Negative for dysuria.  Musculoskeletal: Negative for joint swelling.  Skin: Negative for rash.  Neurological: Negative for headaches.  Hematological: Does not bruise/bleed easily.  Psychiatric/Behavioral: Positive for dysphoric mood. The patient is nervous/anxious.        Objective:   Physical Exam Ow male in nad No skin breakdown or pressure necrosis from cpap mask LE without edema, no cyanosis noted. Alert, does not appear sleepy, moves all 4        Assessment & Plan:

## 2011-03-16 NOTE — Assessment & Plan Note (Signed)
The pt is having issues with mask tolerance, but would be willing to try nasal pillows with chin strap.  He does not have issues with pressure tolerance.  I have also discussed possible upper airway surgery with him, and he will not do well with dental appliance given his septal deviation.  He wishes to stick with cpap for now, and will try nasal pillows.  I have also encouraged him to work on weight loss.

## 2011-03-30 ENCOUNTER — Ambulatory Visit (AMBULATORY_SURGERY_CENTER): Payer: 59 | Admitting: *Deleted

## 2011-03-30 VITALS — Ht 71.0 in | Wt 259.0 lb

## 2011-03-30 DIAGNOSIS — Z1211 Encounter for screening for malignant neoplasm of colon: Secondary | ICD-10-CM

## 2011-03-30 MED ORDER — PEG-KCL-NACL-NASULF-NA ASC-C 100 G PO SOLR: ORAL | Status: DC

## 2011-04-06 ENCOUNTER — Telehealth: Payer: Self-pay | Admitting: *Deleted

## 2011-04-06 NOTE — Telephone Encounter (Signed)
Received via fax from [facility] patients Moviprep is not covered by his insurance.  Called patient and told patient that his insurance will not cover Moviprep and we have a coupon for a free Moviprep kit, if he would like to come pick it up. Patient stated that his son will come and pick it up this morning.

## 2011-04-13 ENCOUNTER — Ambulatory Visit (AMBULATORY_SURGERY_CENTER): Payer: 59 | Admitting: Internal Medicine

## 2011-04-13 ENCOUNTER — Encounter: Payer: Self-pay | Admitting: Internal Medicine

## 2011-04-13 VITALS — BP 149/91 | HR 76 | Temp 98.0°F | Resp 20 | Ht 71.0 in | Wt 250.0 lb

## 2011-04-13 DIAGNOSIS — D126 Benign neoplasm of colon, unspecified: Secondary | ICD-10-CM

## 2011-04-13 DIAGNOSIS — Z86010 Personal history of colonic polyps: Secondary | ICD-10-CM

## 2011-04-13 DIAGNOSIS — Z1211 Encounter for screening for malignant neoplasm of colon: Secondary | ICD-10-CM

## 2011-04-13 DIAGNOSIS — K5730 Diverticulosis of large intestine without perforation or abscess without bleeding: Secondary | ICD-10-CM

## 2011-04-13 LAB — GLUCOSE, CAPILLARY: Glucose-Capillary: 93 mg/dL (ref 70–99)

## 2011-04-13 LAB — HM COLONOSCOPY

## 2011-04-13 MED ORDER — SODIUM CHLORIDE 0.9 % IV SOLN: 500.0000 mL | INTRAVENOUS | Status: DC

## 2011-04-13 NOTE — Patient Instructions (Signed)
FOLLOW DISCHARGE INSTRUCTIONS (BLUE & GREEN SHEETS)  INFORMATION HANDOUT ON POLYPS GIVEN TO YOU   AWAIT LETTER WITH PATHOLOGY RESULTS FROM DR. PERRY

## 2011-04-14 ENCOUNTER — Telehealth: Payer: Self-pay

## 2011-04-14 NOTE — Telephone Encounter (Signed)

## 2011-05-26 ENCOUNTER — Other Ambulatory Visit: Payer: Self-pay | Admitting: Internal Medicine

## 2011-05-28 NOTE — Telephone Encounter (Signed)
Done

## 2011-06-29 ENCOUNTER — Ambulatory Visit: Payer: 59 | Admitting: Internal Medicine

## 2011-06-29 DIAGNOSIS — Z0289 Encounter for other administrative examinations: Secondary | ICD-10-CM

## 2011-07-22 DIAGNOSIS — Z0279 Encounter for issue of other medical certificate: Secondary | ICD-10-CM

## 2011-09-10 ENCOUNTER — Other Ambulatory Visit: Payer: Self-pay | Admitting: Internal Medicine

## 2011-09-12 ENCOUNTER — Other Ambulatory Visit: Payer: Self-pay | Admitting: Pulmonary Disease

## 2011-09-14 ENCOUNTER — Ambulatory Visit: Payer: 59 | Admitting: Pulmonary Disease

## 2011-09-22 ENCOUNTER — Ambulatory Visit: Payer: 59 | Admitting: Pulmonary Disease

## 2011-09-30 ENCOUNTER — Ambulatory Visit: Payer: 59 | Admitting: Pulmonary Disease

## 2012-01-21 ENCOUNTER — Ambulatory Visit (INDEPENDENT_AMBULATORY_CARE_PROVIDER_SITE_OTHER): Payer: 59 | Admitting: Internal Medicine

## 2012-01-21 VITALS — BP 149/82 | HR 78 | Temp 98.0°F | Wt 258.0 lb

## 2012-01-21 DIAGNOSIS — E291 Testicular hypofunction: Secondary | ICD-10-CM

## 2012-01-21 DIAGNOSIS — R112 Nausea with vomiting, unspecified: Secondary | ICD-10-CM

## 2012-01-21 DIAGNOSIS — I10 Essential (primary) hypertension: Secondary | ICD-10-CM

## 2012-01-21 DIAGNOSIS — E119 Type 2 diabetes mellitus without complications: Secondary | ICD-10-CM

## 2012-01-21 DIAGNOSIS — E785 Hyperlipidemia, unspecified: Secondary | ICD-10-CM

## 2012-01-21 LAB — CBC WITH DIFFERENTIAL/PLATELET
Basophils Absolute: 0.1 10*3/uL (ref 0.0–0.1)
Eosinophils Absolute: 0.1 10*3/uL (ref 0.0–0.7)
HCT: 42.9 % (ref 39.0–52.0)
Lymphs Abs: 1.5 10*3/uL (ref 0.7–4.0)
MCHC: 32.9 g/dL (ref 30.0–36.0)
MCV: 84.2 fl (ref 78.0–100.0)
Monocytes Absolute: 0.8 10*3/uL (ref 0.1–1.0)
Neutro Abs: 5.9 10*3/uL (ref 1.4–7.7)
Platelets: 425 10*3/uL — ABNORMAL HIGH (ref 150.0–400.0)
RDW: 15.8 % — ABNORMAL HIGH (ref 11.5–14.6)

## 2012-01-21 LAB — HEMOGLOBIN A1C: Hgb A1c MFr Bld: 6.5 % (ref 4.6–6.5)

## 2012-01-21 MED ORDER — OMEPRAZOLE 40 MG PO CPDR: 40.0000 mg | DELAYED_RELEASE_CAPSULE | Freq: Every day | ORAL | Status: DC

## 2012-01-21 MED ORDER — ROSUVASTATIN CALCIUM 40 MG PO TABS: 40.0000 mg | ORAL_TABLET | Freq: Every day | ORAL | Status: DC

## 2012-01-21 MED ORDER — LISINOPRIL 20 MG PO TABS: 10.0000 mg | ORAL_TABLET | Freq: Every day | ORAL | Status: DC

## 2012-01-21 NOTE — Progress Notes (Signed)
  Subjective:    Patient ID: William Stokes, male    DOB: 06/24/1946, 66 y.o.   MRN: [id number]  HPI Acute visit One month history of on and off nausea and vomiting. Often times he vomits twice a day, clear phlegm, no hemoptysis. Symptoms are worst postprandially.  Also of headaches for the last 2 weeks, mild, last one hour, usually in the morning. No "worst of life", located usually in the forehead.  History of diabetes, rarely checks his blood sugars but the last CBG was 125  Past Medical History  Diagnosis Date  . Osteoarthritis     left knee  . Diabetes mellitus 2/09  . Hypertension 3/11  . PTSD (post-traumatic stress disorder)     went to [name], has chronic diff. sleeping  . Hyperlipidemia   . Glaucoma   . Primary male hypogonadism   . LAFB (left anterior fascicular block)     per baseline EKG 2009  . Erectile dysfunction   . Sleep apnea, obstructive     c-pap  . GERD (gastroesophageal reflux disease)   . Adenomatous colon polyp    Past Surgical History  Procedure Date  . Knee arthroscopy 1999    meniscal torn L, Dr.Dalldorf  . Colonoscopy     Review of Systems Denies change in the color of the stools, no blood in the stools. He is taking ibuprofen 800 mg once daily for the last year for DJD. Lately has developed frequent heartburn, no dysphagia per se. No history of cholecystectomy or PUD. Denies fever chills, no runny nose, sore throat or allergies.    Objective:   Physical Exam General -- alert, well-developed. No apparent distress.  HEENT -- not pale or jaundice Lungs -- normal respiratory effort, no intercostal retractions, no accessory muscle use, and normal breath sounds.   Heart-- normal rate, regular rhythm, no murmur, and no gallop.   Abdomen--soft, mild tenderness at the epigastric area without mass, rebound. Good bowel sounds.  DRE-- brown stools, Hemoccult negative  Extremities-- no pretibial edema bilaterally  Neurological exam--speech gait and  motor are intact. Gait limited only by DJD. Psych-- Cognition and judgment appear intact. Alert and cooperative with normal attention span and concentration.  not anxious appearing and not depressed appearing.        Assessment & Plan:   Mild headache, neurological exam nonfocal. Observation.

## 2012-01-21 NOTE — Assessment & Plan Note (Addendum)
One month history of nausea, vomiting, epigastric pain. Symptoms are worse postprandial. He takes ibuprofen. Most likely gastritis. Plan: DC ibuprofen PPIs CBC If not better, will need a GI referral and a u/s.

## 2012-01-21 NOTE — Assessment & Plan Note (Signed)
Ran out of Crestor 2 weeks ago, refill

## 2012-01-21 NOTE — Assessment & Plan Note (Signed)
Run out of lisinopril 2 weeks ago, refill.

## 2012-01-21 NOTE — Assessment & Plan Note (Signed)
Check a A1c 

## 2012-01-21 NOTE — Assessment & Plan Note (Signed)
Self discontinue HRT several months ago because "it didn't work" Plan: Reassess in the future

## 2012-01-21 NOTE — Patient Instructions (Signed)
Stop ibuprofen Tylenol as needed for pain Restart Lester and lisinopril Call anytime if the symptoms increase. Call if not improving in the next week Come back in one month for a routine checkup.

## 2012-01-22 ENCOUNTER — Encounter: Payer: Self-pay | Admitting: Internal Medicine

## 2012-01-25 ENCOUNTER — Encounter: Payer: Self-pay | Admitting: *Deleted

## 2012-02-04 ENCOUNTER — Telehealth: Payer: Self-pay | Admitting: *Deleted

## 2012-02-04 MED ORDER — LISINOPRIL 20 MG PO TABS: 10.0000 mg | ORAL_TABLET | Freq: Every day | ORAL | Status: DC

## 2012-02-04 NOTE — Telephone Encounter (Signed)
Okay to refill for 6 months, stay on half tablet daily as long as his blood pressure is well-controlled

## 2012-02-04 NOTE — Addendum Note (Signed)
Addended by: Edwena Felty T on: 02/04/2012 04:47 PM   Modules accepted: Orders

## 2012-02-04 NOTE — Telephone Encounter (Signed)
Refill done.  

## 2012-02-04 NOTE — Telephone Encounter (Signed)
Pt states that he miss read Rx bottle and has been taking 1 tab daily of the lisinopril instead of the 1/2 tab as indicated. Pt notes that Rx was filled on 01-21-12 #15 and he is currently out of med.Please advise

## 2012-02-15 LAB — HM DIABETES EYE EXAM

## 2012-02-22 ENCOUNTER — Other Ambulatory Visit: Payer: Self-pay | Admitting: Orthopaedic Surgery

## 2012-02-22 ENCOUNTER — Encounter: Payer: Self-pay | Admitting: *Deleted

## 2012-03-17 ENCOUNTER — Encounter (HOSPITAL_COMMUNITY): Payer: Self-pay | Admitting: Pharmacy Technician

## 2012-03-22 ENCOUNTER — Ambulatory Visit (HOSPITAL_COMMUNITY)
Admission: RE | Admit: 2012-03-22 | Discharge: 2012-03-22 | Disposition: A | Discharge status: Home / Self Care | Payer: 59 | Source: Ambulatory Visit | Attending: Orthopaedic Surgery | Admitting: Orthopaedic Surgery

## 2012-03-22 ENCOUNTER — Encounter (HOSPITAL_COMMUNITY)
Admission: RE | Admit: 2012-03-22 | Discharge: 2012-03-22 | Disposition: A | Discharge status: Home / Self Care | Payer: 59 | Source: Ambulatory Visit | Attending: Orthopaedic Surgery | Admitting: Orthopaedic Surgery

## 2012-03-22 ENCOUNTER — Encounter (HOSPITAL_COMMUNITY): Payer: Self-pay

## 2012-03-22 DIAGNOSIS — Z01818 Encounter for other preprocedural examination: Secondary | ICD-10-CM | POA: Insufficient documentation

## 2012-03-22 DIAGNOSIS — Z01812 Encounter for preprocedural laboratory examination: Secondary | ICD-10-CM | POA: Insufficient documentation

## 2012-03-22 HISTORY — DX: Anxiety disorder, unspecified: F41.9

## 2012-03-22 LAB — CBC
HCT: 41.9 % (ref 39.0–52.0)
MCH: 27.6 pg (ref 26.0–34.0)
MCV: 83.1 fL (ref 78.0–100.0)
Platelets: 379 10*3/uL (ref 150–400)
RBC: 5.04 MIL/uL (ref 4.22–5.81)

## 2012-03-22 LAB — URINALYSIS, ROUTINE W REFLEX MICROSCOPIC
Leukocytes, UA: NEGATIVE
Nitrite: NEGATIVE
Specific Gravity, Urine: 1.017 (ref 1.005–1.030)
Urobilinogen, UA: 1 mg/dL (ref 0.0–1.0)
pH: 6.5 (ref 5.0–8.0)

## 2012-03-22 LAB — DIFFERENTIAL
Eosinophils Absolute: 0.3 10*3/uL (ref 0.0–0.7)
Eosinophils Relative: 4 % (ref 0–5)
Lymphs Abs: 2.2 10*3/uL (ref 0.7–4.0)
Monocytes Absolute: 0.6 10*3/uL (ref 0.1–1.0)
Monocytes Relative: 8 % (ref 3–12)

## 2012-03-22 LAB — TYPE AND SCREEN

## 2012-03-22 LAB — BASIC METABOLIC PANEL
CO2: 32 mEq/L (ref 19–32)
Calcium: 9.7 mg/dL (ref 8.4–10.5)
Chloride: 99 mEq/L (ref 96–112)
Glucose, Bld: 118 mg/dL — ABNORMAL HIGH (ref 70–99)
Sodium: 138 mEq/L (ref 135–145)

## 2012-03-22 MED ORDER — CHLORHEXIDINE GLUCONATE 4 % EX LIQD: 60.0000 mL | Freq: Once | CUTANEOUS | Status: DC

## 2012-03-22 NOTE — Pre-Procedure Instructions (Signed)
[name]  03/22/2012   Your procedure is scheduled on:  Tuesday March 28, 2012  Report to [facility] Short Stay Center at 8:15 AM.  Call this number if you have problems the morning of surgery: [phone]   Remember:   Do not eat food or drink:After Midnight.      Take these medicines the morning of surgery with A SIP OF WATER: prilosec   Do not wear jewelry, make-up or nail polish.  Do not wear lotions, powders, or perfumes. You may wear deodorant.  Do not shave 48 hours prior to surgery. Men may shave face and neck.  Do not bring valuables to the hospital.  Contacts, dentures or bridgework may not be worn into surgery.  Leave suitcase in the car. After surgery it may be brought to your room.  For patients admitted to the hospital, checkout time is 11:00 AM the day of discharge.   Patients discharged the day of surgery will not be allowed to drive home.  Name and phone number of your driver: family / friend  Special Instructions: Incentive Spirometry - Practice and bring it with you on the day of surgery. and CHG Shower Use Special Wash: 1/2 bottle night before surgery and 1/2 bottle morning of surgery.   Please read over the following fact sheets that you were given: Pain Booklet, Coughing and Deep Breathing, Blood Transfusion Information, Total Joint Packet, MRSA Information and Surgical Site Infection Prevention

## 2012-03-23 NOTE — Consult Note (Signed)
Anesthesia chart review: Patient is a 66 year old male scheduled for left total knee arthroplasty by Dr. Jerl Santos on 03/28/2012. History includes former smoker, OA, HTN, DM2, glaucoma, GERD, known LAFB on EKG, anxiety, ED, HLD, OSA with CPAP use (Dr. Shelle Iron), PTSD ([name]), obesity, adenomatous colon polyp.  VS weren't recorded from his PAT visit.  PCP is Dr. Drue Novel.    EKG on 03/22/12 showed NSR, LAFB.  His EKG appears stable since at least 11/13/10.    CXR on 03/22/12 showed no evidence of acute cardiopulmonary disease.  Labs acceptable.  Anticipate he can proceed as planned.  Shonna Chock, PA-C

## 2012-03-27 MED ORDER — CEFAZOLIN SODIUM-DEXTROSE 2-3 GM-% IV SOLR
2.0000 g | INTRAVENOUS | Status: AC
  Administered 2012-03-28: 3 g via INTRAVENOUS
  Filled 2012-03-27: qty 50

## 2012-03-27 NOTE — H&P (Signed)
William Stokes is an 66 y.o. male.   Chief Complaint: left knee pain HPI: this is a 66 year old male with complaints of left knee pain.  She is having trouble resting and nighttime and pain when he walks or stands.  Things have gotten much worse in the last number of months.  He has had pain for well over one year.  He has also had a previous quadriceps repair 20 years ago in [place].he has also had a previous arthroscopy in 2005.  He is currently on Motrin as an anti-inflammatory agent.  He has also failed cortisone injection.  We have discussed proceeding with a left knee replacement to help improve function and decrease pain.  His x-rays reviewed reveal bone-on-bone end-stage DJD of the knee.  Past Medical History  Diagnosis Date  . Osteoarthritis     left knee  . Diabetes mellitus 2/09  . Hypertension 3/11  . Hyperlipidemia   . Glaucoma   . Primary male hypogonadism   . GERD (gastroesophageal reflux disease)   . Adenomatous colon polyp   . LAFB (left anterior fascicular block)     per baseline EKG 2009  . Sleep apnea, obstructive     c-pap  . Anxiety   . PTSD (post-traumatic stress disorder)     went to [name], has chronic diff. sleeping  . Erectile dysfunction     Past Surgical History  Procedure Date  . Knee arthroscopy 1999    meniscal torn L, Dr.Dalldorf  . Colonoscopy   . Quadriceps tendon [place]    left leg    Family History  Problem Relation Age of Onset  . Heart disease Mother   . Heart disease Father   . Colon cancer Father    Social History:  reports that he quit smoking about 20 years ago. His smoking use included Cigarettes. He has a 44 pack-year smoking history. He has never used smokeless tobacco. He reports that he does not drink alcohol or use illicit drugs.  Allergies: No Known Allergies  No prescriptions prior to admission  medications: Lisinopril, simvastatin, Prilosec and ibuprofen  No results found for this or any previous  visit (from the past 48 hour(s)). No results found.  Review of Systems  Constitutional: Negative.   HENT: Negative.   Eyes: Negative.   Respiratory: Negative.   Cardiovascular: Negative.        Positive for hypertension  Gastrointestinal: Negative.   Genitourinary: Negative.   Musculoskeletal: Negative.   Skin: Negative.   Neurological: Negative.   Endo/Heme/Allergies: Negative.   Psychiatric/Behavioral: Negative.     There were no vitals taken for this visit. Physical Exam  Constitutional: He is oriented to person, place, and time. He appears well-nourished.  HENT:  Head: Atraumatic.  Eyes: EOM are normal.  Neck: Neck supple.  Cardiovascular: Regular rhythm.   Respiratory: Effort normal.  GI: Soft.  Musculoskeletal:       Left knee: Longitudinal healed incision from previous quadriceps repair.  Range of motion is 0-85.  No extensor lag.  No significant effusion.  He does have positive crepitation and a moderate varus deformity on the left side.  Normal neurovascular status otherwise no sign of skin infection.  Neurological: He is oriented to person, place, and time.  Skin: Skin is dry.  Psychiatric: He has a normal mood and affect.     Assessment/Plan Assessment: Left knee end-stage DJD with arthroscopy in 2005 and quad repair in 1990. Plan:  I reviewed the risks  of anesthesia, infection, DVT, PE and death related with this type surgery.  Have also stressed the importance of postoperative rehabilitation to optimize results.  I have told him that if in the hospital between 2 and 3 days postoperatively.  Yesmin Mutch R 03/27/2012, 12:43 PM

## 2012-03-28 ENCOUNTER — Encounter (HOSPITAL_COMMUNITY): Payer: Self-pay | Admitting: Vascular Surgery

## 2012-03-28 ENCOUNTER — Encounter (HOSPITAL_COMMUNITY)
Admission: RE | Disposition: A | Discharge status: Home Health | Payer: Self-pay | Source: Ambulatory Visit | Attending: Orthopaedic Surgery

## 2012-03-28 ENCOUNTER — Encounter (HOSPITAL_COMMUNITY): Payer: Self-pay | Admitting: Certified Registered"

## 2012-03-28 ENCOUNTER — Encounter (HOSPITAL_COMMUNITY): Payer: Self-pay

## 2012-03-28 ENCOUNTER — Inpatient Hospital Stay (HOSPITAL_COMMUNITY)
Admission: RE | Admit: 2012-03-28 | Discharge: 2012-03-31 | DRG: 470 | Disposition: A | Discharge status: Home Health | Payer: 59 | Source: Ambulatory Visit | Attending: Orthopaedic Surgery | Admitting: Orthopaedic Surgery

## 2012-03-28 ENCOUNTER — Inpatient Hospital Stay (HOSPITAL_COMMUNITY): Payer: 59 | Admitting: Vascular Surgery

## 2012-03-28 DIAGNOSIS — K219 Gastro-esophageal reflux disease without esophagitis: Secondary | ICD-10-CM | POA: Diagnosis present

## 2012-03-28 DIAGNOSIS — M1712 Unilateral primary osteoarthritis, left knee: Secondary | ICD-10-CM | POA: Diagnosis present

## 2012-03-28 DIAGNOSIS — M1710 Unilateral primary osteoarthritis, unspecified knee: Principal | ICD-10-CM | POA: Diagnosis present

## 2012-03-28 DIAGNOSIS — F411 Generalized anxiety disorder: Secondary | ICD-10-CM | POA: Diagnosis present

## 2012-03-28 DIAGNOSIS — Z87891 Personal history of nicotine dependence: Secondary | ICD-10-CM

## 2012-03-28 DIAGNOSIS — F4310 Post-traumatic stress disorder, unspecified: Secondary | ICD-10-CM | POA: Diagnosis present

## 2012-03-28 DIAGNOSIS — E119 Type 2 diabetes mellitus without complications: Secondary | ICD-10-CM | POA: Diagnosis present

## 2012-03-28 DIAGNOSIS — E785 Hyperlipidemia, unspecified: Secondary | ICD-10-CM | POA: Diagnosis present

## 2012-03-28 DIAGNOSIS — G4733 Obstructive sleep apnea (adult) (pediatric): Secondary | ICD-10-CM | POA: Diagnosis present

## 2012-03-28 DIAGNOSIS — Z8249 Family history of ischemic heart disease and other diseases of the circulatory system: Secondary | ICD-10-CM

## 2012-03-28 DIAGNOSIS — I10 Essential (primary) hypertension: Secondary | ICD-10-CM | POA: Diagnosis present

## 2012-03-28 HISTORY — PX: TOTAL KNEE ARTHROPLASTY: SHX125

## 2012-03-28 LAB — GLUCOSE, CAPILLARY: Glucose-Capillary: 112 mg/dL — ABNORMAL HIGH (ref 70–99)

## 2012-03-28 SURGERY — ARTHROPLASTY, KNEE, TOTAL
Anesthesia: General | Site: Knee | Laterality: Left | Wound class: Clean

## 2012-03-28 MED ORDER — ONDANSETRON HCL 4 MG/2ML IJ SOLN: 4.0000 mg | Freq: Once | INTRAMUSCULAR | Status: DC | PRN

## 2012-03-28 MED ORDER — ATORVASTATIN CALCIUM 80 MG PO TABS
80.0000 mg | ORAL_TABLET | Freq: Every day | ORAL | Status: DC
  Administered 2012-03-28 – 2012-03-30 (×3): 80 mg via ORAL
  Filled 2012-03-28 (×4): qty 1

## 2012-03-28 MED ORDER — LISINOPRIL 10 MG PO TABS
10.0000 mg | ORAL_TABLET | Freq: Every day | ORAL | Status: DC
  Administered 2012-03-28 – 2012-03-31 (×4): 10 mg via ORAL
  Filled 2012-03-28 (×4): qty 1

## 2012-03-28 MED ORDER — CELECOXIB 200 MG PO CAPS
200.0000 mg | ORAL_CAPSULE | Freq: Every day | ORAL | Status: DC
  Administered 2012-03-29 – 2012-03-31 (×3): 200 mg via ORAL
  Filled 2012-03-28 (×3): qty 1

## 2012-03-28 MED ORDER — CEFUROXIME SODIUM 1.5 G IJ SOLR
INTRAMUSCULAR | Status: AC
  Filled 2012-03-28: qty 1.5

## 2012-03-28 MED ORDER — CEFAZOLIN SODIUM-DEXTROSE 2-3 GM-% IV SOLR
2.0000 g | Freq: Four times a day (QID) | INTRAVENOUS | Status: AC
  Administered 2012-03-28 (×2): 2 g via INTRAVENOUS
  Filled 2012-03-28 (×2): qty 50

## 2012-03-28 MED ORDER — LACTATED RINGERS IV SOLN
INTRAVENOUS | Status: DC
  Administered 2012-03-28: 18:00:00 via INTRAVENOUS

## 2012-03-28 MED ORDER — LACTATED RINGERS IV SOLN
INTRAVENOUS | Status: DC | PRN
  Administered 2012-03-28: 10:00:00 via INTRAVENOUS

## 2012-03-28 MED ORDER — SENNOSIDES-DOCUSATE SODIUM 8.6-50 MG PO TABS: 1.0000 | ORAL_TABLET | Freq: Every evening | ORAL | Status: DC | PRN

## 2012-03-28 MED ORDER — HYDROCODONE-ACETAMINOPHEN 5-325 MG PO TABS
1.0000 | ORAL_TABLET | ORAL | Status: DC | PRN
  Administered 2012-03-28 (×2): 2 via ORAL
  Administered 2012-03-29: 1 via ORAL
  Administered 2012-03-29: 2 via ORAL
  Administered 2012-03-30 – 2012-03-31 (×4): 1 via ORAL
  Filled 2012-03-28: qty 1
  Filled 2012-03-28: qty 2
  Filled 2012-03-28: qty 1
  Filled 2012-03-28 (×2): qty 2
  Filled 2012-03-28: qty 1
  Filled 2012-03-28: qty 2
  Filled 2012-03-28: qty 1
  Filled 2012-03-28: qty 2
  Filled 2012-03-28: qty 1
  Filled 2012-03-28: qty 2

## 2012-03-28 MED ORDER — FLEET ENEMA 7-19 GM/118ML RE ENEM: 1.0000 | ENEMA | Freq: Once | RECTAL | Status: AC | PRN

## 2012-03-28 MED ORDER — DOCUSATE SODIUM 100 MG PO CAPS
100.0000 mg | ORAL_CAPSULE | Freq: Two times a day (BID) | ORAL | Status: DC
  Administered 2012-03-28 – 2012-03-31 (×6): 100 mg via ORAL
  Filled 2012-03-28 (×7): qty 1

## 2012-03-28 MED ORDER — LIDOCAINE HCL (CARDIAC) 20 MG/ML IV SOLN
INTRAVENOUS | Status: DC | PRN
  Administered 2012-03-28: 80 mg via INTRAVENOUS

## 2012-03-28 MED ORDER — CEFUROXIME SODIUM 1.5 G IJ SOLR
INTRAMUSCULAR | Status: DC | PRN
  Administered 2012-03-28: 1.5 g

## 2012-03-28 MED ORDER — FERROUS SULFATE 325 (65 FE) MG PO TABS
325.0000 mg | ORAL_TABLET | Freq: Every day | ORAL | Status: DC
  Administered 2012-03-29 – 2012-03-31 (×3): 325 mg via ORAL
  Filled 2012-03-28 (×4): qty 1

## 2012-03-28 MED ORDER — PHENYLEPHRINE HCL 10 MG/ML IJ SOLN
INTRAMUSCULAR | Status: DC | PRN
  Administered 2012-03-28 (×2): 40 ug via INTRAVENOUS

## 2012-03-28 MED ORDER — PHENOL 1.4 % MT LIQD: 1.0000 | OROMUCOSAL | Status: DC | PRN

## 2012-03-28 MED ORDER — METOCLOPRAMIDE HCL 10 MG PO TABS: 5.0000 mg | ORAL_TABLET | Freq: Three times a day (TID) | ORAL | Status: DC | PRN

## 2012-03-28 MED ORDER — HYDROMORPHONE HCL PF 1 MG/ML IJ SOLN
0.5000 mg | INTRAMUSCULAR | Status: DC | PRN
  Administered 2012-03-28: 1 mg via INTRAVENOUS
  Filled 2012-03-28: qty 1

## 2012-03-28 MED ORDER — FENTANYL CITRATE 0.05 MG/ML IJ SOLN
100.0000 ug | Freq: Once | INTRAMUSCULAR | Status: AC
  Administered 2012-03-28: 100 ug via INTRAVENOUS

## 2012-03-28 MED ORDER — LIDOCAINE HCL 4 % MT SOLN
OROMUCOSAL | Status: DC | PRN
  Administered 2012-03-28: 4 mL via TOPICAL

## 2012-03-28 MED ORDER — ONDANSETRON HCL 4 MG/2ML IJ SOLN
INTRAMUSCULAR | Status: DC | PRN
  Administered 2012-03-28: 4 mg via INTRAVENOUS

## 2012-03-28 MED ORDER — METHOCARBAMOL 500 MG PO TABS
500.0000 mg | ORAL_TABLET | Freq: Four times a day (QID) | ORAL | Status: DC | PRN
  Administered 2012-03-28: 500 mg via ORAL

## 2012-03-28 MED ORDER — LACTATED RINGERS IV SOLN: INTRAVENOUS | Status: DC

## 2012-03-28 MED ORDER — ONDANSETRON HCL 4 MG PO TABS: 4.0000 mg | ORAL_TABLET | Freq: Four times a day (QID) | ORAL | Status: DC | PRN

## 2012-03-28 MED ORDER — SODIUM CHLORIDE 0.9 % IV SOLN
INTRAVENOUS | Status: DC | PRN
  Administered 2012-03-28 (×2): via INTRAVENOUS

## 2012-03-28 MED ORDER — ASPIRIN EC 325 MG PO TBEC
325.0000 mg | DELAYED_RELEASE_TABLET | Freq: Two times a day (BID) | ORAL | Status: DC
  Administered 2012-03-29 – 2012-03-31 (×5): 325 mg via ORAL
  Filled 2012-03-28 (×7): qty 1

## 2012-03-28 MED ORDER — ACETAMINOPHEN 325 MG PO TABS: 650.0000 mg | ORAL_TABLET | Freq: Four times a day (QID) | ORAL | Status: DC | PRN

## 2012-03-28 MED ORDER — METOCLOPRAMIDE HCL 5 MG/ML IJ SOLN: 5.0000 mg | Freq: Three times a day (TID) | INTRAMUSCULAR | Status: DC | PRN

## 2012-03-28 MED ORDER — NEOSTIGMINE METHYLSULFATE 1 MG/ML IJ SOLN
INTRAMUSCULAR | Status: DC | PRN
  Administered 2012-03-28: 4 mg via INTRAVENOUS

## 2012-03-28 MED ORDER — FENTANYL CITRATE 0.05 MG/ML IJ SOLN
INTRAMUSCULAR | Status: AC
  Filled 2012-03-28: qty 2

## 2012-03-28 MED ORDER — ONDANSETRON HCL 4 MG/2ML IJ SOLN
4.0000 mg | Freq: Four times a day (QID) | INTRAMUSCULAR | Status: DC | PRN
  Administered 2012-03-28: 4 mg via INTRAVENOUS
  Filled 2012-03-28: qty 2

## 2012-03-28 MED ORDER — SUCCINYLCHOLINE CHLORIDE 20 MG/ML IJ SOLN
INTRAMUSCULAR | Status: DC | PRN
  Administered 2012-03-28: 110 mg via INTRAVENOUS

## 2012-03-28 MED ORDER — ROCURONIUM BROMIDE 100 MG/10ML IV SOLN
INTRAVENOUS | Status: DC | PRN
  Administered 2012-03-28: 50 mg via INTRAVENOUS
  Administered 2012-03-28: 20 mg via INTRAVENOUS

## 2012-03-28 MED ORDER — FENTANYL CITRATE 0.05 MG/ML IJ SOLN
INTRAMUSCULAR | Status: DC | PRN
  Administered 2012-03-28 (×3): 50 ug via INTRAVENOUS
  Administered 2012-03-28: 100 ug via INTRAVENOUS

## 2012-03-28 MED ORDER — HYDROMORPHONE HCL PF 1 MG/ML IJ SOLN
INTRAMUSCULAR | Status: AC
  Filled 2012-03-28: qty 1

## 2012-03-28 MED ORDER — PROPOFOL 10 MG/ML IV EMUL
INTRAVENOUS | Status: DC | PRN
  Administered 2012-03-28: 200 mg via INTRAVENOUS

## 2012-03-28 MED ORDER — CEFAZOLIN SODIUM 1-5 GM-% IV SOLN
INTRAVENOUS | Status: AC
  Filled 2012-03-28: qty 50

## 2012-03-28 MED ORDER — MENTHOL 3 MG MT LOZG: 1.0000 | LOZENGE | OROMUCOSAL | Status: DC | PRN

## 2012-03-28 MED ORDER — PANTOPRAZOLE SODIUM 40 MG PO TBEC
40.0000 mg | DELAYED_RELEASE_TABLET | Freq: Every day | ORAL | Status: DC
  Administered 2012-03-29 – 2012-03-30 (×2): 40 mg via ORAL
  Filled 2012-03-28 (×2): qty 1

## 2012-03-28 MED ORDER — HYDROMORPHONE HCL PF 1 MG/ML IJ SOLN
0.2500 mg | INTRAMUSCULAR | Status: DC | PRN
  Administered 2012-03-28 (×4): 0.5 mg via INTRAVENOUS

## 2012-03-28 MED ORDER — GLYCOPYRROLATE 0.2 MG/ML IJ SOLN
INTRAMUSCULAR | Status: DC | PRN
  Administered 2012-03-28: 0.6 mg via INTRAVENOUS

## 2012-03-28 MED ORDER — LACTATED RINGERS IV SOLN
INTRAVENOUS | Status: DC
  Administered 2012-03-28: 10:00:00 via INTRAVENOUS

## 2012-03-28 MED ORDER — ACETAMINOPHEN 650 MG RE SUPP: 650.0000 mg | Freq: Four times a day (QID) | RECTAL | Status: DC | PRN

## 2012-03-28 MED ORDER — DIPHENHYDRAMINE HCL 12.5 MG/5ML PO ELIX: 12.5000 mg | ORAL_SOLUTION | ORAL | Status: DC | PRN

## 2012-03-28 MED ORDER — METHOCARBAMOL 500 MG PO TABS
ORAL_TABLET | ORAL | Status: AC
  Filled 2012-03-28: qty 1

## 2012-03-28 MED ORDER — HYDROCODONE-ACETAMINOPHEN 5-325 MG PO TABS
ORAL_TABLET | ORAL | Status: AC
  Filled 2012-03-28: qty 2

## 2012-03-28 MED ORDER — METHOCARBAMOL 100 MG/ML IJ SOLN
500.0000 mg | Freq: Four times a day (QID) | INTRAVENOUS | Status: DC | PRN
  Filled 2012-03-28: qty 5

## 2012-03-28 MED ORDER — SODIUM CHLORIDE 0.9 % IR SOLN
Status: DC | PRN
  Administered 2012-03-28: 3000 mL

## 2012-03-28 SURGICAL SUPPLY — 62 items
AUTOTRANSFUSION W/QD PVC DRAIN (AUTOTRANSFUSION) ×1 IMPLANT
BANDAGE ELASTIC 4 VELCRO ST LF (GAUZE/BANDAGES/DRESSINGS) ×1 IMPLANT
BANDAGE ESMARK 6X9 LF (GAUZE/BANDAGES/DRESSINGS) ×1 IMPLANT
BANDAGE GAUZE ELAST BULKY 4 IN (GAUZE/BANDAGES/DRESSINGS) ×4 IMPLANT
BLADE SAGITTAL 25.0X1.19X90 (BLADE) ×2 IMPLANT
BLADE SURG ROTATE 9660 (MISCELLANEOUS) IMPLANT
BNDG CMPR 9X6 STRL LF SNTH (GAUZE/BANDAGES/DRESSINGS) ×1
BNDG CMPR MED 10X6 ELC LF (GAUZE/BANDAGES/DRESSINGS) ×1
BNDG ELASTIC 6X10 VLCR STRL LF (GAUZE/BANDAGES/DRESSINGS) ×2 IMPLANT
BNDG ESMARK 6X9 LF (GAUZE/BANDAGES/DRESSINGS) ×2
BOWL SMART MIX CTS (DISPOSABLE) ×2 IMPLANT
CEMENT HV SMART SET (Cement) ×4 IMPLANT
CLOTH BEACON ORANGE TIMEOUT ST (SAFETY) ×2 IMPLANT
COVER SURGICAL LIGHT HANDLE (MISCELLANEOUS) ×2 IMPLANT
CUFF TOURNIQUET SINGLE 34IN LL (TOURNIQUET CUFF) ×2 IMPLANT
CUFF TOURNIQUET SINGLE 44IN (TOURNIQUET CUFF) IMPLANT
DRAPE EXTREMITY T 121X128X90 (DRAPE) ×2 IMPLANT
DRAPE PROXIMA HALF (DRAPES) ×2 IMPLANT
DRAPE U-SHAPE 47X51 STRL (DRAPES) ×2 IMPLANT
DRSG ADAPTIC 3X8 NADH LF (GAUZE/BANDAGES/DRESSINGS) ×2 IMPLANT
DRSG PAD ABDOMINAL 8X10 ST (GAUZE/BANDAGES/DRESSINGS) ×2 IMPLANT
DURAPREP 26ML APPLICATOR (WOUND CARE) ×2 IMPLANT
ELECT REM PT RETURN 9FT ADLT (ELECTROSURGICAL) ×2
ELECTRODE REM PT RTRN 9FT ADLT (ELECTROSURGICAL) ×1 IMPLANT
EVACUATOR 1/8 PVC DRAIN (DRAIN) ×1 IMPLANT
FACESHIELD LNG OPTICON STERILE (SAFETY) ×4 IMPLANT
GLOVE BIO SURGEON STRL SZ8.5 (GLOVE) ×2 IMPLANT
GLOVE BIOGEL PI IND STRL 8 (GLOVE) ×1 IMPLANT
GLOVE BIOGEL PI IND STRL 8.5 (GLOVE) ×1 IMPLANT
GLOVE BIOGEL PI INDICATOR 8 (GLOVE) ×1
GLOVE BIOGEL PI INDICATOR 8.5 (GLOVE) ×1
GLOVE SS BIOGEL STRL SZ 8 (GLOVE) ×1 IMPLANT
GLOVE SUPERSENSE BIOGEL SZ 8 (GLOVE) ×1
GOWN PREVENTION PLUS XLARGE (GOWN DISPOSABLE) ×2 IMPLANT
GOWN PREVENTION PLUS XXLARGE (GOWN DISPOSABLE) ×2 IMPLANT
GOWN STRL NON-REIN LRG LVL3 (GOWN DISPOSABLE) ×2 IMPLANT
HANDPIECE INTERPULSE COAX TIP (DISPOSABLE) ×2
HOOD PEEL AWAY FACE SHEILD DIS (HOOD) ×2 IMPLANT
IMMOBILIZER KNEE 20 (SOFTGOODS)
IMMOBILIZER KNEE 20 THIGH 36 (SOFTGOODS) IMPLANT
IMMOBILIZER KNEE 22 UNIV (SOFTGOODS) ×2 IMPLANT
IMMOBILIZER KNEE 24 THIGH 36 (MISCELLANEOUS) IMPLANT
IMMOBILIZER KNEE 24 UNIV (MISCELLANEOUS)
KIT BASIN OR (CUSTOM PROCEDURE TRAY) ×2 IMPLANT
KIT ROOM TURNOVER OR (KITS) ×2 IMPLANT
MANIFOLD NEPTUNE II (INSTRUMENTS) ×2 IMPLANT
NS IRRIG 1000ML POUR BTL (IV SOLUTION) ×2 IMPLANT
PACK TOTAL JOINT (CUSTOM PROCEDURE TRAY) ×2 IMPLANT
PAD ARMBOARD 7.5X6 YLW CONV (MISCELLANEOUS) ×4 IMPLANT
SET HNDPC FAN SPRY TIP SCT (DISPOSABLE) ×1 IMPLANT
SPONGE GAUZE 4X4 12PLY (GAUZE/BANDAGES/DRESSINGS) ×2 IMPLANT
STAPLER VISISTAT 35W (STAPLE) ×2 IMPLANT
SUCTION FRAZIER TIP 10 FR DISP (SUCTIONS) ×2 IMPLANT
SUT VIC AB 0 CT1 27 (SUTURE) ×4
SUT VIC AB 0 CT1 27XBRD ANBCTR (SUTURE) ×2 IMPLANT
SUT VIC AB 2-0 CT1 27 (SUTURE) ×4
SUT VIC AB 2-0 CT1 TAPERPNT 27 (SUTURE) ×2 IMPLANT
SUT VLOC 180 0 24IN GS25 (SUTURE) ×2 IMPLANT
TOWEL OR 17X24 6PK STRL BLUE (TOWEL DISPOSABLE) ×2 IMPLANT
TOWEL OR 17X26 10 PK STRL BLUE (TOWEL DISPOSABLE) ×2 IMPLANT
TRAY FOLEY CATH 14FR (SET/KITS/TRAYS/PACK) ×2 IMPLANT
WATER STERILE IRR 1000ML POUR (IV SOLUTION) ×6 IMPLANT

## 2012-03-28 NOTE — Anesthesia Preprocedure Evaluation (Addendum)
Anesthesia Evaluation  Patient identified by MRN, date of birth, ID band Patient awake    Reviewed: Allergy & Precautions, H&P , NPO status , Patient's Chart, lab work & pertinent test results  Airway Mallampati: III TM Distance: >3 FB Neck ROM: full    Dental  (+) Poor Dentition   Pulmonary sleep apnea and Continuous Positive Airway Pressure Ventilation ,  breath sounds clear to auscultation        Cardiovascular hypertension, Pt. on medications + dysrhythmias Rhythm:regular Rate:Normal     Neuro/Psych PSYCHIATRIC DISORDERS Anxiety Depression    GI/Hepatic GERD-  Medicated,  Endo/Other  Well Controlled, Type 2  Renal/GU      Musculoskeletal   Abdominal (+)  Abdomen: soft. Bowel sounds: normal.  Peds  Hematology   Anesthesia Other Findings   Reproductive/Obstetrics                         Anesthesia Physical Anesthesia Plan  ASA: III  Anesthesia Plan: General   Post-op Pain Management:    Induction: Intravenous  Airway Management Planned: Oral ETT  Additional Equipment:   Intra-op Plan:   Post-operative Plan: Extubation in OR  Informed Consent: I have reviewed the patients History and Physical, chart, labs and discussed the procedure including the risks, benefits and alternatives for the proposed anesthesia with the patient or authorized representative who has indicated his/her understanding and acceptance.     Plan Discussed with: CRNA, Anesthesiologist and Surgeon  Anesthesia Plan Comments:         Anesthesia Quick Evaluation

## 2012-03-28 NOTE — Anesthesia Procedure Notes (Addendum)
Anesthesia Regional Block:  Femoral nerve block  Pre-Anesthetic Checklist: ,, timeout performed, Correct Patient, Correct Site, Correct Laterality, Correct Procedure, Correct Position, site marked, Risks and benefits discussed,  Surgical consent,  Pre-op evaluation,  At surgeon's request and post-op pain management  Laterality: Left  Prep: Maximum Sterile Barrier Precautions used, chloraprep and alcohol swabs       Needles:  Injection technique: Single-shot  Needle Type: Stimulator Needle - 80        Needle insertion depth: 7 cm   Additional Needles:  Procedures: nerve stimulator Femoral nerve block  Nerve Stimulator or Paresthesia:  Response: 0.5 mA, 0.1 ms, 7 cm  Additional Responses:   Narrative:  Start time: 03/28/2012 9:40 AM End time: 03/28/2012 9:45 AM Injection made incrementally with aspirations every 5 mL.  Performed by: Personally  Anesthesiologist: Maren Beach MD  Additional Notes: Pt explained risks of procedure and accepts. 20cc 0.5% Marcaine w/ epi w/o difficulty or discomfort. GES   Procedure Name: Intubation Date/Time: 03/28/2012 10:21 AM Performed by: Ellin Goodie Pre-anesthesia Checklist: Emergency Drugs available, Patient identified, Suction available, Patient being monitored and Timeout performed Patient Re-evaluated:Patient Re-evaluated prior to inductionOxygen Delivery Method: Circle system utilized Preoxygenation: Pre-oxygenation with 100% oxygen Intubation Type: IV induction Ventilation: Mask ventilation without difficulty Laryngoscope Size: Mac and 4 Grade View: Grade II Tube type: Oral Tube size: 7.5 mm Number of attempts: 1 Airway Equipment and Method: Stylet Placement Confirmation: ETT inserted through vocal cords under direct vision,  positive ETCO2 and breath sounds checked- equal and bilateral Secured at: 23 cm Tube secured with: Tape Dental Injury: Teeth and Oropharynx as per pre-operative assessment     Performed by:  WEAVER, Norina Cowper M Intubation Type: Rapid sequence

## 2012-03-28 NOTE — Anesthesia Postprocedure Evaluation (Signed)
  Anesthesia Post-op Note  Patient: William Stokes  Procedure(s) Performed: Procedure(s) (LRB): TOTAL KNEE ARTHROPLASTY (Left)  Patient Location: PACU  Anesthesia Type: General  Level of Consciousness: awake, alert  and oriented  Airway and Oxygen Therapy: Patient Spontanous Breathing and Patient connected to nasal cannula oxygen  Post-op Pain: mild  Post-op Assessment: Post-op Vital signs reviewed  Post-op Vital Signs: Reviewed  Complications: No apparent anesthesia complications

## 2012-03-28 NOTE — Progress Notes (Signed)
Orthopedic Tech Progress Note Patient Details:  William Stokes [birth year] [id number]  CPM Left Knee CPM Left Knee: On Left Knee Flexion (Degrees): 60  Left Knee Extension (Degrees): 0    [name] T 03/28/2012, 1:11 PM

## 2012-03-28 NOTE — Progress Notes (Signed)
Patient monitored during block. SR rate 84. Oxygen 95% RA

## 2012-03-28 NOTE — Transfer of Care (Signed)
Immediate Anesthesia Transfer of Care Note  Patient: William Stokes  Procedure(s) Performed: Procedure(s) (LRB): TOTAL KNEE ARTHROPLASTY (Left)  Patient Location: PACU  Anesthesia Type: General  Level of Consciousness: awake, alert  and oriented  Airway & Oxygen Therapy: Patient Spontanous Breathing  Post-op Assessment: Report given to PACU RN  Post vital signs: stable  Complications: No apparent anesthesia complications

## 2012-03-28 NOTE — Progress Notes (Signed)
Utilization review completed. Shail Urbas, RN, BSN. 

## 2012-03-28 NOTE — Op Note (Signed)
PREOP DIAGNOSIS: DJD LEFT KNEE POSTOP DIAGNOSIS: DJD LEFT KNEE PROCEDURE: LEFT TKR ANESTHESIA: General and block ATTENDING SURGEON: Andrei Mccook G ASSISTANT: Lindwood Qua PA  INDICATIONS FOR PROCEDURE: William Stokes is a 66 y.o. male who has struggled for a long time with pain due to degenerative arthritis of the left knee.  The patient has failed many conservative non-operative measures and at this point has pain which limits the ability to sleep and walk.  The patient is offered total knee replacement.  Informed operative consent was obtained after discussion of possible risks of anesthesia, infection, neurovascular injury, DVT, and death.  The importance of the post-operative rehabilitation protocol to optimize result was stressed extensively with the patient.  SUMMARY OF FINDINGS AND PROCEDURE:  DEMARKIS Stokes was taken to the operative suite where under the above anesthesia a left knee replacement was performed.  There were advanced degenerative changes and the bone quality was excellent.  We used the DePuy system and placed size large femur, 5 MBT tibia, 41mm all polyethylene patella, and a size 10 spacer.  I did include Zinacef antibiotic in the cement.  We did our best to correct his significant varus deformity and to address his stiffness. The patient was admitted for appropriate post-op care to include perioperative antibiotics and mechanical and pharmacologic measures for DVT prophylaxis.  DESCRIPTION OF PROCEDURE:  William Stokes was taken to the operative suite where the above anesthesia was applied.  The patient was positioned supine and prepped and draped in normal sterile fashion.  An appropriate time out was performed.  After the administration of Kefzol pre-op antibiotic a standard longitudinal incision was made on the anterior knee.  Dissection was carried down to the extensor mechanism.  All appropriate anti-infective measures were used including the pre-operative antibiotic,  betadine impregnated drape, and closed hooded exhaust systems for each member of the surgical team.  A medial parapatellar incision was made in the extensor mechanism and the knee cap flipped and the knee flexed.  Some residual meniscal tissues were removed along with any remaining ACL/PCL tissue. I performed a released of medial structures to address his varus deformity.  A guide was placed on the tibia and a flat cut was made on it's superior surface.  An intramedullary guide was placed in the femur and was utilized to make anterior and posterior cuts creating an appropriate flexion gap.  A second intramedullary guide was placed in the femur to make a distal cut properly balancing the knee with an extension gap equal to the flexion gap.  The three bones sized to the above mentioned sizes and the appropriate guides were placed and utilized.  A trial reduction was done and the knee easily came to full extension and the patella tracked well on flexion.  The trial components were removed and all bones were cleaned with pulsatile lavage and then dried thoroughly.  Cement was mixed including antibiotic and was pressurized onto the bones followed by placement of the aforementioned components.  Excess cement was trimmed and pressure was held on the components until the cement had hardened.  The tourniquet was deflated and a small amount of bleeding was controlled with cautery and pressure.  The knee was irrigated.  The extensor mechanism was re-approximated with V-loc suture in running fashion.  The knee was flexed and the repair was solid.  The subcutaneous tissues were re-approximated with #0 and #2-0 vicryl and the skin closed with staples.  A sterile dressing was applied.  Intraoperative fluids,  EBL, and tourniquet time can be obtained from anesthesia records.  DISPOSITION:  The patient was taken to recovery room in stable condition and admitted for appropriate post-op care to include peri-operative antibiotic and  DVT prophylaxis with mechanical and pharmacologic measures.  Jefrey Raburn G 03/28/2012, 11:58 AM

## 2012-03-28 NOTE — Interval H&P Note (Signed)
History and Physical Interval Note:  03/28/2012 9:23 AM  William Stokes  has presented today for surgery, with the diagnosis of LEFT KNEE DEGENERATIVE JOINT DISEASE  The various methods of treatment have been discussed with the patient and family. After consideration of risks, benefits and other options for treatment, the patient has consented to  Procedure(s) (LRB): TOTAL KNEE ARTHROPLASTY (Left) as a surgical intervention .  The patient's history has been reviewed, patient examined, no change in status, stable for surgery.  I have reviewed the patient's chart and labs.  Questions were answered to the patient's satisfaction.     [name]

## 2012-03-29 ENCOUNTER — Encounter (HOSPITAL_COMMUNITY): Payer: Self-pay | Admitting: Orthopaedic Surgery

## 2012-03-29 LAB — CBC
HCT: 39.2 % (ref 39.0–52.0)
Hemoglobin: 12.9 g/dL — ABNORMAL LOW (ref 13.0–17.0)
MCHC: 32.9 g/dL (ref 30.0–36.0)
RBC: 4.65 MIL/uL (ref 4.22–5.81)

## 2012-03-29 LAB — GLUCOSE, CAPILLARY
Glucose-Capillary: 124 mg/dL — ABNORMAL HIGH (ref 70–99)
Glucose-Capillary: 152 mg/dL — ABNORMAL HIGH (ref 70–99)
Glucose-Capillary: 156 mg/dL — ABNORMAL HIGH (ref 70–99)
Glucose-Capillary: 171 mg/dL — ABNORMAL HIGH (ref 70–99)

## 2012-03-29 LAB — BASIC METABOLIC PANEL
BUN: 15 mg/dL (ref 6–23)
CO2: 32 mEq/L (ref 19–32)
GFR calc non Af Amer: 75 mL/min — ABNORMAL LOW (ref >90)
Glucose, Bld: 131 mg/dL — ABNORMAL HIGH (ref 70–99)
Potassium: 4.1 mEq/L (ref 3.5–5.1)

## 2012-03-29 NOTE — Progress Notes (Signed)
Physical Therapy Evaluation Patient Details Name: William Stokes MRN: [id number] DOB: [birth year] Today's Date: 03/29/2012 Time: 4098-1191 PT Time Calculation (min): 26 min  PT Assessment / Plan / Recommendation Clinical Impression  Pt is 66 yo male s/p left TKA who ambulated 29' with RW and min A, had some buckling of the left knee this morning with only 1/5 left quad.  Recommend RW and 3-in-1 at d/c as well as HHPT. PT will follow.    PT Assessment  Patient needs continued PT services    Follow Up Recommendations  Home health PT;Supervision - Intermittent    Barriers to Discharge None      Equipment Recommendations  Rolling walker with 5" wheels;3 in 1 bedside comode    Recommendations for Other Services     Frequency 7X/week    Precautions / Restrictions Precautions Precautions: Knee Precaution Booklet Issued: No Precaution Comments: educated pt on elevation of extremity and not putting pillow under left knee Restrictions Weight Bearing Restrictions: Yes LLE Weight Bearing: Weight bearing as tolerated   Pertinent Vitals/Pain 4/10 left knee pain, premedicated      Mobility  Bed Mobility Bed Mobility: Supine to Sit;Sitting - Scoot to Edge of Bed Supine to Sit: 4: Min assist;With rails;HOB elevated Sitting - Scoot to [place] of Bed: 4: Min assist Details for Bed Mobility Assistance: min A to support left leg and slide it across bed as pt used bed rail to pull to side of bed and then scoot to edge Transfers Transfers: Sit to Stand;Stand to Sit Sit to Stand: 4: Min assist;From bed;From elevated surface;With upper extremity assist Stand to Sit: 4: Min assist;With upper extremity assist;To chair/3-in-1 Details for Transfer Assistance: vc's for hand placement and use of right leg to push up to standing Ambulation/Gait Ambulation/Gait Assistance: 4: Min assist Ambulation Distance (Feet): 20 Feet Assistive device: Rolling walker Ambulation/Gait Assistance Details: vc's for  sequencing gait and for getting left foot flat to floor.  Pt experienced some buckling of the left LE in stance phase, was able to compensate by taking wt through UE's on RW Gait Pattern: Step-to pattern;Decreased step length - right;Decreased step length - left;Decreased stance time - left;Decreased weight shift to left;Left flexed knee in stance Gait velocity: decreased Stairs: No Wheelchair Mobility Wheelchair Mobility: No    Exercises Total Joint Exercises Ankle Circles/Pumps: AROM;Both;10 reps;Seated Quad Sets: AROM;Both;10 reps;Seated   PT Diagnosis: Difficulty walking;Abnormality of gait;Acute pain  PT Problem List: Decreased strength;Decreased range of motion;Decreased activity tolerance;Decreased mobility;Decreased knowledge of use of DME;Decreased knowledge of precautions;Pain PT Treatment Interventions: DME instruction;Gait training;Stair training;Functional mobility training;Therapeutic activities;Therapeutic exercise;Patient/family education   PT Goals Acute Rehab PT Goals PT Goal Formulation: With patient/family Time For Goal Achievement: 04/05/12 Potential to Achieve Goals: Good Pt will go Supine/Side to Sit: with modified independence;with HOB 0 degrees PT Goal: Supine/Side to Sit - Progress: Goal set today Pt will go Sit to Supine/Side: with modified independence;with HOB 0 degrees PT Goal: Sit to Supine/Side - Progress: Goal set today Pt will go Sit to Stand: with modified independence PT Goal: Sit to Stand - Progress: Goal set today Pt will go Stand to Sit: with modified independence PT Goal: Stand to Sit - Progress: Goal set today Pt will Ambulate: >150 feet;with modified independence;with rolling walker PT Goal: Ambulate - Progress: Goal set today Pt will Go Up / Down Stairs: 3-5 stairs;with rail(s);with supervision PT Goal: Up/Down Stairs - Progress: Goal set today Pt will Perform Home Exercise Program: Independently PT Goal: Perform  Home Exercise Program -  Progress: Goal set today  Visit Information  Last PT Received On: 03/29/12 Assistance Needed: +1    Subjective Data  Subjective: I want to get out of this bed Patient Stated Goal: return home   Prior Functioning  Home Living Lives With: Spouse Available Help at Discharge: Family;Available PRN/intermittently Type of Home: House Home Access: Stairs to enter [employer] of Steps: 4 Entrance Stairs-Rails: Left Home Layout: One level Home Adaptive Equipment: None Additional Comments: pt's wife is a Charity fundraiser, works Marketing executive Level of Independence: Independent Able to Take Stairs?: Yes Driving: Yes Vocation: Retired Comments: pt was independent but ambulated with a limp, past 10 yrs.  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  Also reports that he needs to have the right knee replaced in about 6 mos. Communication Communication: No difficulties    Cognition  Overall Cognitive Status: Appears within functional limits for tasks assessed/performed Arousal/Alertness: Awake/alert Orientation Level: Appears intact for tasks assessed Behavior During Session: [facility] for tasks performed Cognition - Other Comments: pt anxious and jittery as he really wants to get out of bed    Extremity/Trunk Assessment Right Upper Extremity Assessment RUE ROM/Strength/Tone: WFL for tasks assessed RUE Sensation: WFL - Light Touch RUE Coordination: WFL - gross motor Left Upper Extremity Assessment LUE ROM/Strength/Tone: WFL for tasks assessed LUE Sensation: WFL - Light Touch LUE Coordination: WFL - gross motor Right Lower Extremity Assessment RLE ROM/Strength/Tone: Deficits RLE ROM/Strength/Tone Deficits: grossly 4/5 but this knee is painful too RLE Sensation: WFL - Light Touch;WFL - Proprioception RLE Coordination: WFL - gross motor Left Lower Extremity Assessment LLE  ROM/Strength/Tone: Deficits;Unable to fully assess;Due to pain LLE ROM/Strength/Tone Deficits: pt unable to left leg against or perpendicular to gravity, quad 1/5 LLE Sensation: WFL - Light Touch LLE Coordination: Deficits LLE Coordination Deficits: unable to move leg yet Trunk Assessment Trunk Assessment: Normal   Balance Balance Balance Assessed: No  End of Session PT - End of Session Equipment Utilized During Treatment: Gait belt Activity Tolerance: Patient tolerated treatment well Patient left: in chair;with call bell/phone within reach;with family/visitor present Nurse Communication: Mobility status CPM Left Knee CPM Left Knee: Off Left Knee Flexion (Degrees): 60  Left Knee Extension (Degrees):  (pt lacks full knee extension)  GP   Lyanne Co, PT  Acute Rehab Services  [phone]   Lyanne Co 03/29/2012, 10:41 AM

## 2012-03-29 NOTE — Progress Notes (Signed)
CARE MANAGEMENT NOTE 03/29/2012  Patient:  William Stokes, William Stokes   Account Number:  [id number]  Date Initiated:  03/29/2012  Documentation initiated by:  Vance Peper  Subjective/Objective Assessment:   66 yr old male s/p left total knee arthroplasty     Action/Plan:   CM spoke with patient regarding home health needs and DME. Patient states preoperatively setup with [facility], no changes. CM called [facility] to confirm, start of Treatment will be 04/01/12 per Annabelle Harman. DME to be delivered.   Anticipated DC Date:  03/31/2012   Anticipated DC Plan:  HOME W HOME HEALTH SERVICES      DC Planning Services  CM consult      [facility] Choice  HOME HEALTH   Choice offered to / List presented to:  C-1 Patient        HH arranged  HH-2 PT      [facility] agency  [facility]   Status of service:  In process, will continue to follow Medicare Important Message given?   (If response is "NO", the following Medicare IM given date fields will be blank) Date Medicare IM given:   Date Additional Medicare IM given:    Discharge Disposition:  HOME W HOME HEALTH SERVICES  Per UR Regulation:    If discussed at Long Length of Stay Meetings, dates discussed:    Comments:

## 2012-03-29 NOTE — Progress Notes (Signed)
Physical Therapy Treatment Patient Details Name: William Stokes MRN: [id number] DOB: 1946/05/11 Today's Date: 03/29/2012 Time: 4098-1191 PT Time Calculation (min): 29 min  PT Assessment / Plan / Recommendation Comments on Treatment Session  Pt progressing very well for POD #1, ambulated 54' with RW and min-guard A, pt has to be encouraged to not rush and not throw RW and left leg too far out in front of hime to speed up ambulation.  PT will continue to follow.    Follow Up Recommendations  Home health PT;Supervision - Intermittent    Barriers to Discharge        Equipment Recommendations  Rolling walker with 5" wheels;3 in 1 bedside comode    Recommendations for Other Services    Frequency 7X/week   Plan Discharge plan remains appropriate;Frequency remains appropriate    Precautions / Restrictions Precautions Precautions: Knee Precaution Booklet Issued: No Restrictions Weight Bearing Restrictions: Yes LLE Weight Bearing: Weight bearing as tolerated   Pertinent Vitals/Pain 7/10 left knee pain, premedicated    Mobility  Bed Mobility Bed Mobility: Sit to Supine Sit to Supine: 4: Min assist;HOB flat Details for Bed Mobility Assistance: pt able to hold leg up to begin return to bed but needs min A to get leg fully into bed Transfers Transfers: Sit to Stand;Stand to Sit Sit to Stand: 4: Min guard;From chair/3-in-1;With upper extremity assist Stand to Sit: 4: Min guard;With upper extremity assist;To bed Details for Transfer Assistance: pt improving with transfers, reminded of hand placement Ambulation/Gait Ambulation/Gait Assistance: 4: Min guard Ambulation Distance (Feet): 65 Feet Assistive device: Rolling walker Ambulation/Gait Assistance Details: pt knee not buckling this afternoon, vc's to get left heel to floor with stance. Pt tends to place RW too far out in front of himself, frequent vc's to remind to keep RW close and not increase left step length Gait Pattern: Step-to  pattern Stairs: No Wheelchair Mobility Wheelchair Mobility: No    Exercises Total Joint Exercises Ankle Circles/Pumps: AROM;Both;10 reps;Seated Quad Sets: AROM;Left;Seated;10 reps;Limitations Quad Sets Limitations: 1/5 quad contraction Heel Slides: AROM;Seated;10 reps;Left Straight Leg Raises: AAROM;Left;10 reps;Seated Long Arc Quad: AAROM;Left;10 reps;Seated   PT Diagnosis:    PT Problem List:   PT Treatment Interventions:     PT Goals Acute Rehab PT Goals PT Goal Formulation: With patient/family Time For Goal Achievement: 04/05/12 Potential to Achieve Goals: Good Pt will go Supine/Side to Sit: with modified independence;with HOB 0 degrees PT Goal: Supine/Side to Sit - Progress: Progressing toward goal Pt will go Sit to Supine/Side: with modified independence;with HOB 0 degrees PT Goal: Sit to Supine/Side - Progress: Progressing toward goal Pt will go Sit to Stand: with modified independence PT Goal: Sit to Stand - Progress: Progressing toward goal Pt will go Stand to Sit: with modified independence PT Goal: Stand to Sit - Progress: Progressing toward goal Pt will Ambulate: >150 feet;with modified independence;with rolling walker PT Goal: Ambulate - Progress: Progressing toward goal Pt will Go Up / Down Stairs: 3-5 stairs;with rail(s);with supervision Pt will Perform Home Exercise Program: Independently PT Goal: Perform Home Exercise Program - Progress: Progressing toward goal  Visit Information  Last PT Received On: 03/29/12 Assistance Needed: +1    Subjective Data  Subjective: It felt so good to be out of the bed Patient Stated Goal: return home   Cognition  Overall Cognitive Status: Appears within functional limits for tasks assessed/performed Arousal/Alertness: Awake/alert Orientation Level: Appears intact for tasks assessed Behavior During Session: [facility] for tasks performed    Balance  Balance Balance Assessed: Yes Dynamic Standing Balance Dynamic Standing -  Balance Support: Bilateral upper extremity supported;During functional activity Dynamic Standing - Level of Assistance: 5: Stand by assistance  End of Session PT - End of Session Equipment Utilized During Treatment: Gait belt Activity Tolerance: Patient tolerated treatment well Patient left: in bed;in CPM;with call bell/phone within reach (CPM 70 degrees) Nurse Communication: Mobility status   GP   Lyanne Co, PT  Acute Rehab Services  [phone]   Lyanne Co 03/29/2012, 3:39 PM

## 2012-03-29 NOTE — Progress Notes (Signed)
Subjective: 1 Day Post-Op Procedure(s) (LRB): TOTAL KNEE ARTHROPLASTY (Left)  Activity level:  Up with pt Diet tolerance:  eating Voiding:  Foley out Patient reports pain as 6 on 0-10 scale.    Objective: Vital signs in last 24 hours: Temp:  [97.4 F (36.3 C)-98.8 F (37.1 C)] 97.4 F (36.3 C) (08/14 0553) Pulse Rate:  [68-94] 94  (08/14 0553) Resp:  [13-20] 19  (08/14 0553) BP: (118-162)/(77-104) 118/77 mmHg (08/14 0553) SpO2:  [96 %-100 %] 100 % (08/14 0553)  Labs:  Basename 03/29/12 0604  HGB 12.9*    Basename 03/29/12 0604  WBC 7.7  RBC 4.65  HCT 39.2  PLT 389    Basename 03/29/12 0604  NA 137  K 4.1  CL 97  CO2 32  BUN 15  CREATININE 1.03  GLUCOSE 131*  CALCIUM 9.0   No results found for this basename: LABPT:2,INR:2 in the last 72 hours  Physical Exam:  Neurologically intact ABD soft Neurovascular intact Sensation intact distally Intact pulses distally Dorsiflexion/Plantar flexion intact No cellulitis present Compartment soft Dressing dry  Assessment/Plan:  1 Day Post-Op Procedure(s) (LRB): TOTAL KNEE ARTHROPLASTY (Left) Advance diet Up with therapy D/C IV fluids Anticipate d/c fri Will need home pt     Shuntell Foody R 03/29/2012, 7:56 AM

## 2012-03-30 LAB — CBC
Hemoglobin: 12.2 g/dL — ABNORMAL LOW (ref 13.0–17.0)
MCHC: 33.1 g/dL (ref 30.0–36.0)
Platelets: 333 10*3/uL (ref 150–400)
RDW: 14.9 % (ref 11.5–15.5)

## 2012-03-30 LAB — GLUCOSE, CAPILLARY: Glucose-Capillary: 153 mg/dL — ABNORMAL HIGH (ref 70–99)

## 2012-03-30 MED ORDER — CELECOXIB 200 MG PO CAPS: 200.0000 mg | ORAL_CAPSULE | Freq: Every day | ORAL | Status: AC

## 2012-03-30 MED ORDER — METHOCARBAMOL 500 MG PO TABS: 500.0000 mg | ORAL_TABLET | Freq: Four times a day (QID) | ORAL | Status: AC | PRN

## 2012-03-30 MED ORDER — HYDROCODONE-ACETAMINOPHEN 5-325 MG PO TABS: 1.0000 | ORAL_TABLET | ORAL | Status: AC | PRN

## 2012-03-30 MED ORDER — BISACODYL 5 MG PO TBEC
10.0000 mg | DELAYED_RELEASE_TABLET | Freq: Every day | ORAL | Status: DC | PRN
Start: 2012-03-30 — End: 2012-03-31
  Administered 2012-03-30: 10 mg via ORAL
  Filled 2012-03-30: qty 2

## 2012-03-30 MED ORDER — ASPIRIN 325 MG PO TBEC: 325.0000 mg | DELAYED_RELEASE_TABLET | Freq: Two times a day (BID) | ORAL | Status: AC

## 2012-03-30 NOTE — Evaluation (Signed)
Occupational Therapy Evaluation Patient Details Name: William Stokes MRN: [id number] DOB: 11/02/1945 Today's Date: 03/30/2012 Time: 4098-1191 OT Time Calculation (min): 31 min  OT Assessment / Plan / Recommendation Clinical Impression  Pleasant 66 yr old male admitted for elective LTKA.  Pt currently min assist level for simulated slefcare tasks and min guard assist for functional transfers.  Pt will need 3:1 for home, and wife is providing 24 hour supervision.  No further OT needs.    OT Assessment  Patient does not need any further OT services    Follow Up Recommendations  No OT follow up       Equipment Recommendations  3 in 1 bedside comode          Precautions / Restrictions Precautions Precautions: Knee Restrictions Weight Bearing Restrictions: No LLE Weight Bearing: Weight bearing as tolerated   Pertinent Vitals/Pain Pain 3/10 in the right knee, meds given earlier    ADL  Eating/Feeding: Independent Where Assessed - Eating/Feeding: Edge of bed Grooming: Simulated;Supervision/safety Where Assessed - Grooming: Supported standing Upper Body Bathing: Simulated;Set up Where Assessed - Upper Body Bathing: Unsupported sitting Lower Body Bathing: Simulated;Minimal assistance Where Assessed - Lower Body Bathing: Supported sit to stand Upper Body Dressing: Simulated;Set up Where Assessed - Upper Body Dressing: Unsupported sitting Lower Body Dressing: Simulated;Minimal assistance Where Assessed - Lower Body Dressing: Supported sit to stand Toilet Transfer: Simulated;Min Pension scheme manager Method: Other (comment) (ambulating to the bathroom) Acupuncturist: Bedside commode Toileting - Clothing Manipulation and Hygiene: Simulated;Min guard Where Assessed - Engineer, mining and Hygiene: Sit to stand from 3-in-1 or toilet Tub/Shower Transfer: Simulated;Min guard Tub/Shower Transfer Method: Ambulating Equipment Used: Rolling  walker Transfers/Ambulation Related to ADLs: Pt currently min guard for short distance ambulation in the room during simulated selfcare tasks. ADL Comments: Pt's wife present for session.  Practiced simulated walk-in shower transfer and educated them on using the 3:1 for a shower seat.  Also discussed AE secondary to pt having difficulty dressing the LLE.  Wife will be available 24 hours for the first week.  No further OT needs.      OT Goals    Visit Information  Last OT Received On: 03/30/12 Assistance Needed: +1    Subjective Data  Subjective: "You know how I hurt my legs don't you?" Patient Stated Goal: Get his knees feeling better and be pain free.   Prior Functioning  Vision/Perception  Home Living Lives With: Spouse Available Help at Discharge: Family;Available PRN/intermittently Type of Home: House Home Access: Stairs to enter [employer] of Steps: 4 Entrance Stairs-Rails: Left Home Layout: One level Bathroom Shower/Tub: Walk-in shower;Door [employer] Toilet: Pharmacist, community: Yes Home Adaptive Equipment: None Additional Comments: pt's wife is a Charity fundraiser, works Marketing executive Level of Independence: Independent Able to Take Stairs?: Yes Driving: Yes Vocation: Retired Musician: No difficulties Dominant Hand: Right   Vision - Assessment Vision Assessment: Vision not tested Perception Perception: Within Functional Limits Praxis Praxis: Intact  Cognition  Overall Cognitive Status: Appears within functional limits for tasks assessed/performed Arousal/Alertness: Awake/alert Orientation Level: Appears intact for tasks assessed Behavior During Session: [facility] for tasks performed    Extremity/Trunk Assessment Right Upper Extremity Assessment RUE ROM/Strength/Tone: Within functional levels Left Upper Extremity Assessment LUE ROM/Strength/Tone: Within functional levels Trunk Assessment Trunk Assessment: Normal    Mobility Bed Mobility Bed Mobility: Supine to Sit Supine to Sit: 4: Min assist Sitting - Scoot to Edge of Bed: 4: Min guard Sit to Supine: 4:  Min assist Details for Bed Mobility Assistance: Min assist with weight of LLE in/out of bed. Pt able to return to flat bed with assist of rails. Transfers Transfers: Sit to Stand Sit to Stand: 4: Min guard;With upper extremity assist;From bed Stand to Sit: 4: Min guard;Without upper extremity assist;To bed Details for Transfer Assistance: VC for hand placement and proper sequencing. Assist for stability      Balance Balance Balance Assessed: Yes Dynamic Standing Balance Dynamic Standing - Balance Support: Right upper extremity supported;Left upper extremity supported Dynamic Standing - Level of Assistance: 5: Stand by assistance;Other (comment) (with use of RW during transfers)  End of Session OT - End of Session Activity Tolerance: Patient tolerated treatment well Patient left: in bed;with call bell/phone within reach;with family/visitor present     [facility] OTR/L Pager number [phone] 03/30/2012, 12:33 PM

## 2012-03-30 NOTE — Progress Notes (Signed)
Physical Therapy Treatment Patient Details Name: William Stokes MRN: [id number] DOB: 07-09-1946 Today's Date: 03/30/2012 Time: 4098-1191 PT Time Calculation (min): 30 min  PT Assessment / Plan / Recommendation Comments on Treatment Session  Pt completed stairs this session, and is near supervision with all mobility. Continue per plan    Follow Up Recommendations  Home health PT;Supervision - Intermittent    Barriers to Discharge        Equipment Recommendations  Rolling walker with 5" wheels;3 in 1 bedside comode    Recommendations for Other Services    Frequency 7X/week   Plan Discharge plan remains appropriate;Frequency remains appropriate    Precautions / Restrictions Precautions Precautions: Knee Restrictions Weight Bearing Restrictions: Yes LLE Weight Bearing: Weight bearing as tolerated       Mobility  Bed Mobility Bed Mobility: Supine to Sit;Sitting - Scoot to Edge of Bed;Sit to Supine Supine to Sit: 4: Min assist;With rails;HOB elevated Sitting - Scoot to Edge of Bed: 4: Min guard Sit to Supine: 4: Min assist Details for Bed Mobility Assistance: Min assist with weight of LLE in/out of bed. Pt able to return to flat bed with assist of rails. Transfers Transfers: Sit to Stand;Stand to Sit Sit to Stand: 4: Min assist;With upper extremity assist;From bed Stand to Sit: 4: Min assist;With upper extremity assist;To bed Details for Transfer Assistance: VC for hand placement and proper sequencing. Assist for stability Ambulation/Gait Ambulation/Gait Assistance: 5: Supervision;4: Min guard Ambulation Distance (Feet): 150 Feet Assistive device: Rolling walker Ambulation/Gait Assistance Details: Pt with difficulty with full knee extension during stance phase, cueing for quad contraction to increase knee extension. No buckling noted this session. VC for safety with RW. Gait Pattern: Step-to pattern;Left flexed knee in stance Gait velocity: decreased Stairs: Yes Stairs  Assistance: 4: Min guard Stair Management Technique: One rail Left;Sideways;Step to pattern Number of Stairs: 4     Exercises Total Joint Exercises Heel Slides: AROM;Seated;10 reps;Left Long Arc Quad: AROM;Strengthening;Left;10 reps;Seated    PT Goals Acute Rehab PT Goals PT Goal: Supine/Side to Sit - Progress: Progressing toward goal PT Goal: Sit to Supine/Side - Progress: Progressing toward goal PT Goal: Sit to Stand - Progress: Progressing toward goal PT Goal: Stand to Sit - Progress: Progressing toward goal PT Goal: Ambulate - Progress: Progressing toward goal PT Goal: Up/Down Stairs - Progress: Met PT Goal: Perform Home Exercise Program - Progress: Progressing toward goal  Visit Information  Last PT Received On: 03/30/12 Assistance Needed: +1    Subjective Data  Subjective: I don't have any pain   Cognition  Overall Cognitive Status: Appears within functional limits for tasks assessed/performed Arousal/Alertness: Awake/alert Orientation Level: Appears intact for tasks assessed Behavior During Session: [facility] for tasks performed       End of Session PT - End of Session Equipment Utilized During Treatment: Gait belt Activity Tolerance: Patient tolerated treatment well Patient left: in bed;in CPM;with call bell/phone within reach Nurse Communication: Mobility status   Milana Kidney 03/30/2012, 10:26 AM

## 2012-03-30 NOTE — Progress Notes (Signed)
Subjective: 2 Days Post-Op Procedure(s) (LRB): TOTAL KNEE ARTHROPLASTY (Left)  Activity level:  OOB with PT Diet tolerance:  regular Voiding:  well Patient reports pain as mild.    Objective: Vital signs in last 24 hours: Temp:  [97.5 F (36.4 C)-100 F (37.8 C)] 98.6 F (37 C) (08/15 1300) Pulse Rate:  [99-106] 99  (08/15 1300) Resp:  [18] 18  (08/15 1300) BP: (115-146)/(71-80) 122/71 mmHg (08/15 1300) SpO2:  [95 %-100 %] 95 % (08/15 1300)  Labs:  Basename 03/30/12 0600 03/29/12 0604  HGB 12.2* 12.9*    Basename 03/30/12 0600 03/29/12 0604  WBC 10.6* 7.7  RBC 4.41 4.65  HCT 36.9* 39.2  PLT 333 389    Basename 03/29/12 0604  NA 137  K 4.1  CL 97  CO2 32  BUN 15  CREATININE 1.03  GLUCOSE 131*  CALCIUM 9.0   No results found for this basename: LABPT:2,INR:2 in the last 72 hours  Physical Exam:  ABD soft Neurovascular intact Sensation intact distally Intact pulses distally Incision: no drainage No cellulitis present Compartment soft  Assessment/Plan:  2 Days Post-Op Procedure(s) (LRB): TOTAL KNEE ARTHROPLASTY (Left)  Dressing changed Up with therapy Plan for discharge tomorrow    William Stokes G 03/30/2012, 1:36 PM

## 2012-03-31 LAB — CBC
HCT: 33.2 % — ABNORMAL LOW (ref 39.0–52.0)
Hemoglobin: 11.1 g/dL — ABNORMAL LOW (ref 13.0–17.0)
MCH: 27.9 pg (ref 26.0–34.0)
MCHC: 33.4 g/dL (ref 30.0–36.0)
MCV: 83.4 fL (ref 78.0–100.0)
RBC: 3.98 MIL/uL — ABNORMAL LOW (ref 4.22–5.81)

## 2012-03-31 NOTE — Discharge Summary (Signed)
Patient ID: William Stokes MRN: [id number] DOB/AGE: 1946/03/12 66 y.o.  Admit date: 03/28/2012 Discharge date: 03/31/2012  Admission Diagnoses:  Principal Problem:  *Left knee DJD   Discharge Diagnoses:  Same  Past Medical History  Diagnosis Date  . Osteoarthritis     left knee  . Diabetes mellitus 2/09  . Hypertension 3/11  . Hyperlipidemia   . Glaucoma   . Primary male hypogonadism   . GERD (gastroesophageal reflux disease)   . Adenomatous colon polyp   . LAFB (left anterior fascicular block)     per baseline EKG 2009  . Sleep apnea, obstructive     c-pap  . Anxiety   . PTSD (post-traumatic stress disorder)     went to [name], has chronic diff. sleeping  . Erectile dysfunction     Surgeries: Procedure(s): TOTAL KNEE ARTHROPLASTY on 03/28/2012   Consultants:    Discharged Condition: Improved  Hospital Course: KHOURY SIEMON is an 66 y.o. male who was admitted 03/28/2012 for operative treatment ofLeft knee DJD. Patient has severe unremitting pain that affects sleep, daily activities, and work/hobbies. After pre-op clearance the patient was taken to the operating room on 03/28/2012 and underwent  Procedure(s): TOTAL KNEE ARTHROPLASTY.    Patient was given perioperative antibiotics: Anti-infectives     Start     Dose/Rate Route Frequency Ordered Stop   03/28/12 1600   ceFAZolin (ANCEF) IVPB 2 g/50 mL premix        2 g 100 mL/hr over 30 Minutes Intravenous Every 6 hours 03/28/12 1446 03/28/12 2237   03/28/12 1049   cefUROXime (ZINACEF) injection  Status:  Discontinued          As needed 03/28/12 1049 03/28/12 1224   03/28/12 0924   ceFAZolin (ANCEF) 1-5 GM-% IVPB     Comments: SAVAGE, MICHELLE: cabinet override         03/28/12 0924 03/28/12 2129   03/27/12 1433   ceFAZolin (ANCEF) IVPB 2 g/50 mL premix        2 g 100 mL/hr over 30 Minutes Intravenous 60 min pre-op 03/27/12 1433 03/28/12 1015           Patient was given sequential compression devices,  early ambulation, and chemoprophylaxis to prevent DVT. ASA 325 mg 1 po BID x 2 weeks  Patient benefited maximally from hospital stay and there were no complications.    Recent vital signs: Patient Vitals for the past 24 hrs:  BP Temp Pulse Resp SpO2  03/31/12 0517 113/73 mmHg 98.7 F (37.1 C) 82  18  98 %  April 28, 2012 2200 150/74 mmHg 100.2 F (37.9 C) 103  18  98 %  04/28/2012 1600 - - - 18  -  April 28, 2012 1300 122/71 mmHg 98.6 F (37 C) 99  18  95 %  Apr 28, 2012 1200 - - - 18  -     Recent laboratory studies:  Basename 03/31/12 0612 2012-04-28 0600 03/29/12 0604  WBC 10.5 10.6* --  HGB 11.1* 12.2* --  HCT 33.2* 36.9* --  PLT 343 333 --  NA -- -- 137  K -- -- 4.1  CL -- -- 97  CO2 -- -- 32  BUN -- -- 15  CREATININE -- -- 1.03  GLUCOSE -- -- 131*  INR -- -- --  CALCIUM -- -- 9.0     Discharge Medications:   Medication List  As of 03/31/2012  8:21 AM   STOP taking these medications  aspirin 81 MG tablet      ibuprofen 800 MG tablet         TAKE these medications         aspirin 325 MG EC tablet   Take 1 tablet (325 mg total) by mouth 2 (two) times daily.      celecoxib 200 MG capsule   Commonly known as: CELEBREX   Take 1 capsule (200 mg total) by mouth daily.      HYDROcodone-acetaminophen 5-325 MG per tablet   Commonly known as: NORCO/VICODIN   Take 1-2 tablets by mouth every 4 (four) hours as needed (breakthrough pain).      lisinopril 20 MG tablet   Commonly known as: PRINIVIL,ZESTRIL   Take 10 mg by mouth daily.      methocarbamol 500 MG tablet   Commonly known as: ROBAXIN   Take 1 tablet (500 mg total) by mouth every 6 (six) hours as needed.      omeprazole 40 MG capsule   Commonly known as: PRILOSEC   Take 40 mg by mouth daily.      rosuvastatin 40 MG tablet   Commonly known as: CRESTOR   Take 40 mg by mouth daily.      tadalafil 20 MG tablet   Commonly known as: CIALIS   Take 20 mg by mouth every other day.            Diagnostic  Studies: Dg Chest 2 View  03/22/2012  *RADIOLOGY REPORT*  Clinical Data: Preop left total knee arthroplasty  CHEST - 2 VIEW  Comparison: 12/09/2010  Findings: Lungs are clear. No pleural effusion or pneumothorax.  Cardiomediastinal silhouette is within normal limits.  Degenerative changes of the visualized thoracolumbar spine.  IMPRESSION: No evidence of acute cardiopulmonary disease.  Original Report Authenticated By: Charline Bills, M.D.    Disposition: 01-Home or Self Care  Discharge Orders    Future Orders Please Complete By Expires   Diet - low sodium heart healthy      Call MD / Call 911      Comments:   If you experience chest pain or shortness of breath, CALL 911 and be transported to the hospital emergency room.  If you develope a fever above 101 F, pus (white drainage) or increased drainage or redness at the wound, or calf pain, call your surgeon's office.   Constipation Prevention      Comments:   Drink plenty of fluids.  Prune juice may be helpful.  You may use a stool softener, such as Colace (over the counter) 100 mg twice a day.  Use MiraLax (over the counter) for constipation as needed.   Increase activity slowly as tolerated         Follow-up Information    Follow up with Velna Ochs, MD in 12 days.   Contact information:   [address] [place] [address] [phone]           Signed: Prince Rome 03/31/2012, 8:21 AM

## 2012-03-31 NOTE — Progress Notes (Signed)
Discharge instructions and prescriptions given to patient and his wife.  Patient already has follow-up appointment with Dr. Jerl Santos.  Front wheel walker along with all belongings taken home with pt and his wife.  [facility] is to deliver the home health equipment to pt home.  Pt and wife verbalized understanding regarding discharge instructions.  Patient was instructed to call the MD should any issues arise prior to follow-up appointment.  Clova Morlock N

## 2012-03-31 NOTE — Progress Notes (Signed)
Subjective: 3 Days Post-Op Procedure(s) (LRB): TOTAL KNEE ARTHROPLASTY (Left)  Activity level:  oob with pt  Diet tolerance:  eating Voiding:  ok Patient reports pain as 2 on 0-10 scale.    Objective: Vital signs in last 24 hours: Temp:  [98.6 F (37 C)-100.2 F (37.9 C)] 98.7 F (37.1 C) (08/16 0517) Pulse Rate:  [82-103] 82  (08/16 0517) Resp:  [18] 18  (08/16 0517) BP: (113-150)/(71-74) 113/73 mmHg (08/16 0517) SpO2:  [95 %-98 %] 98 % (08/16 0517)  Labs:  Basename 03/31/12 0612 03/30/12 0600 03/29/12 0604  HGB 11.1* 12.2* 12.9*    Basename 03/31/12 0612 03/30/12 0600  WBC 10.5 10.6*  RBC 3.98* 4.41  HCT 33.2* 36.9*  PLT 343 333    Basename 03/29/12 0604  NA 137  K 4.1  CL 97  CO2 32  BUN 15  CREATININE 1.03  GLUCOSE 131*  CALCIUM 9.0   No results found for this basename: LABPT:2,INR:2 in the last 72 hours  Physical Exam:  Neurologically intact ABD soft Neurovascular intact Sensation intact distally Intact pulses distally Dorsiflexion/Plantar flexion intact No cellulitis present Compartment soft  Assessment/Plan:  3 Days Post-Op Procedure(s) (LRB): TOTAL KNEE ARTHROPLASTY (Left) Advance diet Up with therapy Discharge home with home health    Eather Chaires R 03/31/2012, 8:19 AM

## 2012-06-27 ENCOUNTER — Encounter: Payer: Self-pay | Admitting: Internal Medicine

## 2012-06-27 ENCOUNTER — Ambulatory Visit (INDEPENDENT_AMBULATORY_CARE_PROVIDER_SITE_OTHER): Payer: 59 | Admitting: Internal Medicine

## 2012-06-27 VITALS — BP 132/84 | HR 67 | Temp 97.7°F | Wt 280.0 lb

## 2012-06-27 DIAGNOSIS — Z01818 Encounter for other preprocedural examination: Secondary | ICD-10-CM

## 2012-06-27 DIAGNOSIS — E7800 Pure hypercholesterolemia, unspecified: Secondary | ICD-10-CM

## 2012-06-27 DIAGNOSIS — E119 Type 2 diabetes mellitus without complications: Secondary | ICD-10-CM

## 2012-06-27 DIAGNOSIS — Z23 Encounter for immunization: Secondary | ICD-10-CM

## 2012-06-27 DIAGNOSIS — I10 Essential (primary) hypertension: Secondary | ICD-10-CM

## 2012-06-27 DIAGNOSIS — E785 Hyperlipidemia, unspecified: Secondary | ICD-10-CM

## 2012-06-27 DIAGNOSIS — G4733 Obstructive sleep apnea (adult) (pediatric): Secondary | ICD-10-CM

## 2012-06-27 LAB — CBC WITH DIFFERENTIAL/PLATELET
Basophils Absolute: 0.1 10*3/uL (ref 0.0–0.1)
Eosinophils Absolute: 0.3 10*3/uL (ref 0.0–0.7)
HCT: 42.6 % (ref 39.0–52.0)
Hemoglobin: 13.8 g/dL (ref 13.0–17.0)
Lymphs Abs: 2 10*3/uL (ref 0.7–4.0)
MCHC: 32.3 g/dL (ref 30.0–36.0)
Monocytes Relative: 10.1 % (ref 3.0–12.0)
Neutro Abs: 3.9 10*3/uL (ref 1.4–7.7)
RDW: 14.9 % — ABNORMAL HIGH (ref 11.5–14.6)

## 2012-06-27 LAB — LIPID PANEL: Cholesterol: 232 mg/dL — ABNORMAL HIGH (ref 0–200)

## 2012-06-27 LAB — COMPREHENSIVE METABOLIC PANEL
Albumin: 4 g/dL (ref 3.5–5.2)
CO2: 31 mEq/L (ref 19–32)
Calcium: 9.1 mg/dL (ref 8.4–10.5)
GFR: 117.99 mL/min (ref >60.00)
Glucose, Bld: 104 mg/dL — ABNORMAL HIGH (ref 70–99)
Potassium: 4.4 mEq/L (ref 3.5–5.1)
Sodium: 139 mEq/L (ref 135–145)
Total Protein: 7.7 g/dL (ref 6.0–8.3)

## 2012-06-27 MED ORDER — LISINOPRIL 20 MG PO TABS: 10.0000 mg | ORAL_TABLET | Freq: Every day | ORAL | Status: DC

## 2012-06-27 MED ORDER — TADALAFIL 20 MG PO TABS: 20.0000 mg | ORAL_TABLET | ORAL | Status: DC

## 2012-06-27 MED ORDER — OMEPRAZOLE 40 MG PO CPDR: 40.0000 mg | DELAYED_RELEASE_CAPSULE | Freq: Every day | ORAL | Status: DC

## 2012-06-27 NOTE — Progress Notes (Signed)
  Subjective:    Patient ID: William Stokes, male    DOB: 12-10-1945, 66 y.o.   MRN: [id number]  HPI Here for surgical clearance. Patient to have a right total knee replacement soon. In general he feels well, had a left knee replacement few months ago, no surgical complications per patient. Since then, he is gradually recovering, he is not very active because of recent surgery but has not experienced any chest pain, shortness of breath or palpitations. Before the DJD disabled him, he was able to do all his activities of daily living without problems.  As far as his chronic medical problems: Diabetes, check his CBGs from time to time, usually in the low 100s. High cholesterol, pt discontinue Crestor several months ago due to aches and pains, pain is resolved. PTSD, good compliance with Zoloft, symptoms well-controlled History of a sleep apnea, used to use a CPAP, for the last 6 months has not used it, "can't  tolerate it".  Past Medical History  Diagnosis Date  . Osteoarthritis     left knee  . Diabetes mellitus 2/09  . Hypertension 3/11  . Hyperlipidemia   . Glaucoma(365)   . Primary male hypogonadism   . GERD (gastroesophageal reflux disease)   . Adenomatous colon polyp   . LAFB (left anterior fascicular block)     per baseline EKG 2009  . Sleep apnea, obstructive   . Anxiety   . PTSD (post-traumatic stress disorder)     went to [name], has chronic diff. sleeping  . Erectile dysfunction    Past Surgical History  Procedure Date  . Knee arthroscopy 1999    meniscal torn L, Dr.Dalldorf  . Colonoscopy   . Quadriceps tendon [place]    left leg  . Hernia repair   . Total knee arthroplasty 03/28/2012    LEFT TOTAL KNEE ARTHROPLASTY;  Surgeon: Velna Ochs, MD;  Location: [facility] OR;  Service: Orthopedics;  Laterality: Left;  with revision tibia   Family History  Problem Relation Age of Onset  . Heart disease Mother   . Prostate cancer Father   . Colon cancer  Father   . 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 quit 1993 Alcohol Use - no Illicit Drug Use - no  Review of Systems No nausea, vomiting, diarrhea or blood in the stools Trace lower extremity edema, no orthopnea. No difficulty urinating or dysuria. Does not feel particularly fatigued but he does feel sleepy throughout the day.    Objective:   Physical Exam General -- alert, well-developed, and overweight appearing. No apparent distress.  Neck --no JVD at 45 HEENT -- not pale or jaundice Lungs -- normal respiratory effort, no intercostal retractions, no accessory muscle use, and normal breath sounds.   Heart-- normal rate, regular rhythm, no murmur, and no gallop.   Abdomen--soft, non-tender, no distention, no masses  Extremities-- trace pretibial edema bilaterally  Psych-- Cognition and judgment appear intact. Alert and cooperative with normal attention span and concentration.  not anxious appearing and not depressed appearing.      Assessment & Plan:

## 2012-06-27 NOTE — Assessment & Plan Note (Signed)
Has not been using his CPAP for the last 6 months, to have surgery soon, refer back to pulmonary

## 2012-06-27 NOTE — Assessment & Plan Note (Signed)
Reports normal ambulatory BPs 

## 2012-06-27 NOTE — Assessment & Plan Note (Signed)
66 year old gentleman with well-controlled hypertension and diabetes, needs a right total knee replacement. Had a left total knee replacement few months ago without complications. Review of systems is only positive for feeling sleepy, he has a sleep apnea has not been using his CPAP. EKG and chest x-ray 03-2012 were essentially within normal. Plan: Cleared for surgery from my standpoint pending labs I am concerned about his sleep apnea not being treated, will refer to Dr. Shelle Iron

## 2012-06-27 NOTE — Assessment & Plan Note (Signed)
Due for labs

## 2012-06-27 NOTE — Assessment & Plan Note (Signed)
Self discontinue Crestor due to aches and pains, labs. Consider Pravachol.

## 2012-06-28 ENCOUNTER — Encounter: Payer: Self-pay | Admitting: Internal Medicine

## 2012-06-30 MED ORDER — PRAVASTATIN SODIUM 20 MG PO TABS: 20.0000 mg | ORAL_TABLET | Freq: Every day | ORAL | Status: DC

## 2012-06-30 MED ORDER — SITAGLIPTIN PHOSPHATE 50 MG PO TABS: 50.0000 mg | ORAL_TABLET | Freq: Every day | ORAL | Status: DC

## 2012-06-30 NOTE — Addendum Note (Signed)
Addended by: Edwena Felty T on: 06/30/2012 12:08 PM   Modules accepted: Orders

## 2012-07-05 ENCOUNTER — Ambulatory Visit (INDEPENDENT_AMBULATORY_CARE_PROVIDER_SITE_OTHER): Payer: 59 | Admitting: Pulmonary Disease

## 2012-07-05 ENCOUNTER — Encounter: Payer: Self-pay | Admitting: Pulmonary Disease

## 2012-07-05 VITALS — BP 110/74 | HR 95 | Temp 97.9°F | Ht 71.0 in | Wt 279.0 lb

## 2012-07-05 DIAGNOSIS — G4733 Obstructive sleep apnea (adult) (pediatric): Secondary | ICD-10-CM

## 2012-07-05 NOTE — Progress Notes (Signed)
  Subjective:    Patient ID: William Stokes, male    DOB: [birth year], 66 y.o.   MRN: [id number]  HPI The patient comes in today for followup of his obstructive sleep apnea.  At the last visit, he was having issues with mask tolerance, and this was changed to nasal pillows with a chin strap.  He saw significant improvement, and felt this mask setup was fairly comfortable.  He wore the CPAP consistently for a period of time and felt that he benefited greatly.  He then stopped using the device quite a good because of the inconvenience, and what he describes as "laziness".  He is interested in getting back on the CPAP.   Review of Systems  Constitutional: Negative for fever and unexpected weight change.  HENT: Negative for ear pain, nosebleeds, congestion, sore throat, rhinorrhea, sneezing, trouble swallowing, dental problem, postnasal drip and sinus pressure.   Eyes: Negative for redness and itching.  Respiratory: Negative for cough, chest tightness, shortness of breath and wheezing.   Cardiovascular: Negative for palpitations and leg swelling.  Gastrointestinal: Negative for nausea and vomiting.  Genitourinary: Negative for dysuria.  Musculoskeletal: Negative for joint swelling.  Skin: Negative for rash.  Neurological: Positive for headaches.  Hematological: Does not bruise/bleed easily.  Psychiatric/Behavioral: Positive for dysphoric mood. The patient is nervous/anxious.        Objective:   Physical Exam Overweight male in no acute distress Nose without purulence or discharge noted Neck without lymphadenopathy or thyromegaly Lower extremities with mild edema, no cyanosis Alert and oriented, moves all 4 extremities.       Assessment & Plan:

## 2012-07-05 NOTE — Patient Instructions (Addendum)
Will get you with your medical equipment company and make sure your machine is in working order, and the mask is ok. Please call me if you are not able to wear consistently, so we can troubleshoot. Work on weight loss followup with me in 6mos.

## 2012-07-05 NOTE — Assessment & Plan Note (Signed)
The patient has history of moderate obstructive sleep apnea, and has done well with CPAP in the past.  He stopped using the device, and now wishes to get back on CPAP.  I will get him back to his medical equipment company so they can check the functioning of his machine and mask.  I stressed to him again to call us if he is having poor tolerance so that we can troubleshoot the device.  I also encouraged him to work aggressively on weight loss.

## 2012-07-21 ENCOUNTER — Other Ambulatory Visit: Payer: Self-pay | Admitting: Orthopaedic Surgery

## 2012-07-24 ENCOUNTER — Telehealth: Payer: Self-pay | Admitting: Internal Medicine

## 2012-07-24 ENCOUNTER — Encounter (HOSPITAL_COMMUNITY): Payer: Self-pay | Admitting: Pharmacy Technician

## 2012-07-24 NOTE — Telephone Encounter (Signed)
Spoke w/pt spouse. Advised her that request has been forwarded to HIM and she should follow-up with them. Pt was given phone # for [employer].

## 2012-07-24 NOTE — Telephone Encounter (Signed)
Wife is calling for the request of her husband medical records. They came to the office and signed a release 2 weeks ago but she has not heard back from the office.  Please contact [phone].

## 2012-07-26 ENCOUNTER — Encounter (HOSPITAL_COMMUNITY)
Admission: RE | Admit: 2012-07-26 | Discharge: 2012-07-26 | Disposition: A | Discharge status: Home / Self Care | Payer: 59 | Source: Ambulatory Visit | Attending: Orthopaedic Surgery | Admitting: Orthopaedic Surgery

## 2012-07-26 ENCOUNTER — Encounter (HOSPITAL_COMMUNITY): Payer: Self-pay

## 2012-07-26 HISTORY — DX: Unspecified cataract: H26.9

## 2012-07-26 HISTORY — DX: Insomnia, unspecified: G47.00

## 2012-07-26 HISTORY — DX: Dermatitis, unspecified: L30.9

## 2012-07-26 LAB — URINALYSIS, ROUTINE W REFLEX MICROSCOPIC
Bilirubin Urine: NEGATIVE
Hgb urine dipstick: NEGATIVE
Ketones, ur: NEGATIVE mg/dL
Nitrite: NEGATIVE
Protein, ur: NEGATIVE mg/dL
Specific Gravity, Urine: 1.027 (ref 1.005–1.030)
Urobilinogen, UA: 1 mg/dL (ref 0.0–1.0)

## 2012-07-26 LAB — CBC WITH DIFFERENTIAL/PLATELET
Eosinophils Relative: 3 % (ref 0–5)
HCT: 43.7 % (ref 39.0–52.0)
Lymphocytes Relative: 23 % (ref 12–46)
Lymphs Abs: 1.7 10*3/uL (ref 0.7–4.0)
MCV: 82 fL (ref 78.0–100.0)
Neutro Abs: 4.9 10*3/uL (ref 1.7–7.7)
Platelets: 419 10*3/uL — ABNORMAL HIGH (ref 150–400)
RBC: 5.33 MIL/uL (ref 4.22–5.81)
WBC: 7.5 10*3/uL (ref 4.0–10.5)

## 2012-07-26 LAB — TYPE AND SCREEN
ABO/RH(D): O POS
Antibody Screen: NEGATIVE

## 2012-07-26 LAB — BASIC METABOLIC PANEL
CO2: 29 mEq/L (ref 19–32)
Calcium: 9.8 mg/dL (ref 8.4–10.5)
Chloride: 100 mEq/L (ref 96–112)
GFR calc Af Amer: >90 mL/min (ref >90)
Sodium: 137 mEq/L (ref 135–145)

## 2012-07-26 LAB — PROTIME-INR
INR: 1.08 (ref 0.00–1.49)
Prothrombin Time: 13.9 seconds (ref 11.6–15.2)

## 2012-07-26 LAB — APTT: aPTT: 31 seconds (ref 24–37)

## 2012-07-26 MED ORDER — CHLORHEXIDINE GLUCONATE 4 % EX LIQD: 60.0000 mL | Freq: Once | CUTANEOUS | Status: DC

## 2012-07-26 NOTE — Pre-Procedure Instructions (Signed)
20 [name]  07/26/2012   Your procedure is scheduled on:  Thurs, Dec 19 @ 11:45 AM  Report to [facility] at 9:45 AM.  Call this number if you have problems the morning of surgery: [phone]   Remember:   Do not eat food:After Midnight.  Take these medicines the morning of surgery with A SIP OF WATER: Omeprazole(Prilosec) and Sertraline(Zoloft)             Discontinue Aspirin and NSAIDS(goodys,bc's,aleve,fish oil)   Do not wear jewelry  Do not wear lotions, powders, or colognes. You may wear deodorant.  Men may shave face and neck.  Do not bring valuables to the hospital.  Contacts, dentures or bridgework may not be worn into surgery.  Leave suitcase in the car. After surgery it may be brought to your room.  For patients admitted to the hospital, checkout time is 11:00 AM the day of discharge.   Patients discharged the day of surgery will not be allowed to drive home.    Special Instructions: Shower using CHG 2 nights before surgery and the night before surgery.  If you shower the day of surgery use CHG.  Use special wash - you have one bottle of CHG for all showers.  You should use approximately 1/3 of the bottle for each shower.   Please read over the following fact sheets that you were given: Pain Booklet, Coughing and Deep Breathing, Blood Transfusion Information, Total Joint Packet, MRSA Information and Surgical Site Infection Prevention

## 2012-07-26 NOTE — Consult Note (Signed)
Anesthesia chart review: Patient is a 66 year old male scheduled for right total knee arthroplasty by Dr. Jerl Santos on 08/03/12.  He is s/p left TKA on 03/28/2012.   Other history includes former smoker, OA, HTN, DM2, glaucoma, GERD, known LAFB on EKG, anxiety, ED, HLD, OSA with CPAP use (Dr. Shelle Iron), PTSD ([name]), obesity, adenomatous colon polyp. PCP is Dr. Drue Novel who cleared him for this procedure pending no significant lab abnormalities.  He had him see Dr. Shelle Iron preoperatively for patient to get re-established with CPAP.  His BP was elevated at his PAT appointment at 148/99.  It was not rechecked.  His previous BP readings at his recent PCP and pulmonology appointments were 132/84 and 110/74, respectively.  EKG on 03/22/12 showed NSR, LAFB. His EKG appears stable since at least 11/13/10.   CXR on 03/22/12 showed no evidence of acute cardiopulmonary disease.   Labs acceptable.   Anticipate he can proceed as planned.   Shonna Chock, PA-C

## 2012-07-26 NOTE — Progress Notes (Signed)
Pt doesn't have a cardiologist  Denies ever having an echo/stress test/heart cath  Dr.Jose Drue Novel is Medical MD  EKG and CXR in epic from 03/22/12

## 2012-07-26 NOTE — H&P (Signed)
TOTAL KNEE ADMISSION H&P  Patient is being admitted for right total knee arthroplasty.  Subjective:  Chief Complaint:right knee pain.  HPI: William Stokes, 66 y.o. male, has a history of pain and functional disability in the right knee due to arthritis and has failed non-surgical conservative treatments for greater than 12 weeks to includeNSAID's and/or analgesics, corticosteriod injections, flexibility and strengthening excercises, supervised PT with diminished ADL's post treatment, use of assistive devices, weight reduction as appropriate and activity modification.  Onset of symptoms was gradual, starting 6 years ago with gradually worsening course since that time. The patient noted no past surgery on the right knee(s).  Patient currently rates pain in the right knee(s) at 8 out of 10 with activity. Patient has night pain, worsening of pain with activity and weight bearing, pain that interferes with activities of daily living, pain with passive range of motion, crepitus and joint swelling.  Patient has evidence of subchondral sclerosis, periarticular osteophytes and joint space narrowing by imaging studies. This patient has had no previous surgery. There is no active infection.  Patient Active Problem List   Diagnosis Date Noted  . Left knee DJD 03/28/2012    Priority: High    Class: Chronic  . Pre-op exam 06/27/2012  . General medical examination 02/23/2011  . GERD (gastroesophageal reflux disease) 02/23/2011  . INADEQUATE SLEEP HYGIENE 10/18/2009  . OBSTRUCTIVE SLEEP APNEA 10/06/2009  . ESSENTIAL HYPERTENSION 09/23/2009  . SLEEP DISORDER 09/23/2009  . HYPOGONADISM, MALE 11/18/2008  . GLAUCOMA 07/17/2008  . HYPERLIPIDEMIA 04/17/2008  . ERECTILE DYSFUNCTION 04/17/2008  . PTSD 01/01/2008  . DIABETES MELLITUS, TYPE II 10/25/2007  . OSTEOARTHRITIS 10/10/2007   Past Medical History  Diagnosis Date  . Osteoarthritis     left knee  . Hyperlipidemia     was on Crestor but has been off  for a couple of months  . Glaucoma(365)   . Primary male hypogonadism   . Adenomatous colon polyp   . LAFB (left anterior fascicular block)     per baseline EKG 2009  . Anxiety   . PTSD (post-traumatic stress disorder)     went to [name], has chronic diff. sleeping  . Erectile dysfunction   . Hypertension 3/11    takes Lisinopril daily  . Sleep apnea, obstructive     uses CPAP and last sleep study in epic from 2011  . Joint pain   . Joint swelling   . Eczema     legs and hands  . GERD (gastroesophageal reflux disease)     takes OMeprazole daily  . Diabetes mellitus 2/09    takes Januvia-started 2wks ago  . Cataracts, bilateral     immature  . Glaucoma   . PTSD (post-traumatic stress disorder)     takes Zoloft daily  . Insomnia   . History of MRSA infection     6-4yrs ago  . History of staph infection     Past Surgical History  Procedure Date  . Knee arthroscopy 1999    meniscal torn L, Dr.Dalldorf  . Colonoscopy   . Quadriceps tendon repair while [place]    left leg  . Hernia repair   . Total knee arthroplasty 03/28/2012    LEFT TOTAL KNEE ARTHROPLASTY;  Surgeon: Velna Ochs, MD;  Location: [facility] OR;  Service: Orthopedics;  Laterality: Left;  with revision tibia    No prescriptions prior to admission   No Known Allergies  History  Substance Use Topics  . Smoking status: Former  Smoker -- 2.0 packs/day for 22 years    Types: Cigarettes    Quit date: 08/17/1991  . Smokeless tobacco: Never Used  . Alcohol Use: No    Family History  Problem Relation Age of Onset  . Heart disease Mother   . Prostate cancer Father   . Colon cancer Father   . Diabetes Mother      Review of Systems  Constitutional: Negative.   HENT: Negative.   Eyes: Negative.   Respiratory: Negative.   Cardiovascular: Negative.   Gastrointestinal: Negative.   Genitourinary: Negative.   Musculoskeletal: Positive for joint pain.  Skin: Negative.   Neurological: Negative.    Endo/Heme/Allergies: Negative.   Psychiatric/Behavioral: Negative.     Objective:  Physical Exam  Constitutional: He is oriented to person, place, and time. He appears well-nourished.  HENT:  Head: Atraumatic.  Eyes: EOM are normal.  Neck: Neck supple.  Cardiovascular: Normal rate and regular rhythm.   Respiratory: Breath sounds normal.  GI: Bowel sounds are normal.  Musculoskeletal:       Right knee exam: Range of motion 0-95.   Significant crepitation.  Walks with a mild gait abnormality.  Trace effusion.  Pain along the medial joint line.  Neurological: He is oriented to person, place, and time.  Skin: Skin is dry.  Psychiatric: He has a normal mood and affect.    Vital signs in last 24 hours: Temp:  [97.9 F (36.6 C)] 97.9 F (36.6 C) (12/11 1116) Pulse Rate:  [89] 89  (12/11 1116) Resp:  [20] 20  (12/11 1116) BP: (148)/(99) 148/99 mmHg (12/11 1116) SpO2:  [95 %] 95 % (12/11 1116) Weight:  [126.236 kg (278 lb 4.8 oz)] 126.236 kg (278 lb 4.8 oz) (12/11 1116)  Labs:   Estimated Body mass index is 39.05 kg/(m^2) as calculated from the following:   Height as of 04/13/11: 5\' 11" (1.803 m).   Weight as of 06/27/12: 280 lb(127.007 kg).   Imaging Review Plain radiographs demonstrate severe degenerative joint disease of the right knee(s). The overall alignment isneutral. The bone quality appears to be good for age and reported activity level.  Assessment/Plan:  End stage arthritis, right knee   The patient history, physical examination, clinical judgment of the provider and imaging studies are consistent with end stage degenerative joint disease of the right knee(s) and total knee arthroplasty is deemed medically necessary. The treatment options including medical management, injection therapy arthroscopy and arthroplasty were discussed at length. The risks and benefits of total knee arthroplasty were presented and reviewed. The risks due to aseptic loosening, infection,  stiffness, patella tracking problems, thromboembolic complications and other imponderables were discussed. The patient acknowledged the explanation, agreed to proceed with the plan and consent was signed. Patient is being admitted for inpatient treatment for surgery, pain control, PT, OT, prophylactic antibiotics, VTE prophylaxis, progressive ambulation and ADL's and discharge planning. The patient is planning to be discharged home with home health services

## 2012-08-02 MED ORDER — DEXTROSE 5 % IV SOLN
3.0000 g | INTRAVENOUS | Status: AC
  Administered 2012-08-03: 3 g via INTRAVENOUS
  Filled 2012-08-02: qty 3000

## 2012-08-03 ENCOUNTER — Encounter (HOSPITAL_COMMUNITY): Payer: Self-pay | Admitting: Vascular Surgery

## 2012-08-03 ENCOUNTER — Encounter (HOSPITAL_COMMUNITY)
Admission: RE | Disposition: A | Discharge status: Home / Self Care | Payer: Self-pay | Source: Ambulatory Visit | Attending: Orthopaedic Surgery

## 2012-08-03 ENCOUNTER — Ambulatory Visit (HOSPITAL_COMMUNITY): Payer: 59 | Admitting: Vascular Surgery

## 2012-08-03 ENCOUNTER — Inpatient Hospital Stay (HOSPITAL_COMMUNITY)
Admission: RE | Admit: 2012-08-03 | Discharge: 2012-08-05 | DRG: 470 | Disposition: A | Discharge status: Home / Self Care | Payer: 59 | Source: Ambulatory Visit | Attending: Orthopaedic Surgery | Admitting: Orthopaedic Surgery

## 2012-08-03 ENCOUNTER — Encounter (HOSPITAL_COMMUNITY): Payer: Self-pay | Admitting: *Deleted

## 2012-08-03 DIAGNOSIS — E119 Type 2 diabetes mellitus without complications: Secondary | ICD-10-CM | POA: Diagnosis present

## 2012-08-03 DIAGNOSIS — I10 Essential (primary) hypertension: Secondary | ICD-10-CM | POA: Diagnosis present

## 2012-08-03 DIAGNOSIS — E291 Testicular hypofunction: Secondary | ICD-10-CM | POA: Diagnosis present

## 2012-08-03 DIAGNOSIS — M1711 Unilateral primary osteoarthritis, right knee: Secondary | ICD-10-CM | POA: Diagnosis present

## 2012-08-03 DIAGNOSIS — Z01812 Encounter for preprocedural laboratory examination: Secondary | ICD-10-CM

## 2012-08-03 DIAGNOSIS — M1710 Unilateral primary osteoarthritis, unspecified knee: Principal | ICD-10-CM | POA: Diagnosis present

## 2012-08-03 DIAGNOSIS — F4310 Post-traumatic stress disorder, unspecified: Secondary | ICD-10-CM | POA: Diagnosis present

## 2012-08-03 DIAGNOSIS — H409 Unspecified glaucoma: Secondary | ICD-10-CM | POA: Diagnosis present

## 2012-08-03 DIAGNOSIS — E785 Hyperlipidemia, unspecified: Secondary | ICD-10-CM | POA: Diagnosis present

## 2012-08-03 DIAGNOSIS — Z87891 Personal history of nicotine dependence: Secondary | ICD-10-CM

## 2012-08-03 DIAGNOSIS — Z96659 Presence of unspecified artificial knee joint: Secondary | ICD-10-CM

## 2012-08-03 DIAGNOSIS — G4733 Obstructive sleep apnea (adult) (pediatric): Secondary | ICD-10-CM | POA: Diagnosis present

## 2012-08-03 DIAGNOSIS — K219 Gastro-esophageal reflux disease without esophagitis: Secondary | ICD-10-CM | POA: Diagnosis present

## 2012-08-03 HISTORY — PX: TOTAL KNEE ARTHROPLASTY: SHX125

## 2012-08-03 LAB — GLUCOSE, CAPILLARY: Glucose-Capillary: 128 mg/dL — ABNORMAL HIGH (ref 70–99)

## 2012-08-03 SURGERY — ARTHROPLASTY, KNEE, TOTAL
Anesthesia: General | Site: Knee | Laterality: Right | Wound class: Clean

## 2012-08-03 MED ORDER — CEFUROXIME SODIUM 1.5 G IJ SOLR
INTRAMUSCULAR | Status: AC
  Filled 2012-08-03: qty 1.5

## 2012-08-03 MED ORDER — OXYCODONE HCL 5 MG PO TABS: 5.0000 mg | ORAL_TABLET | Freq: Once | ORAL | Status: DC | PRN

## 2012-08-03 MED ORDER — LACTATED RINGERS IV SOLN: INTRAVENOUS | Status: DC

## 2012-08-03 MED ORDER — SODIUM CHLORIDE 0.9 % IR SOLN
Status: DC | PRN
  Administered 2012-08-03: 3000 mL

## 2012-08-03 MED ORDER — MIDAZOLAM HCL 2 MG/2ML IJ SOLN
1.0000 mg | INTRAMUSCULAR | Status: DC | PRN
  Administered 2012-08-03: 2 mg via INTRAVENOUS

## 2012-08-03 MED ORDER — LIDOCAINE HCL (CARDIAC) 20 MG/ML IV SOLN
INTRAVENOUS | Status: DC | PRN
  Administered 2012-08-03: 80 mg via INTRAVENOUS

## 2012-08-03 MED ORDER — FLEET ENEMA 7-19 GM/118ML RE ENEM: 1.0000 | ENEMA | Freq: Once | RECTAL | Status: AC | PRN

## 2012-08-03 MED ORDER — METHOCARBAMOL 500 MG PO TABS: 500.0000 mg | ORAL_TABLET | Freq: Four times a day (QID) | ORAL | Status: DC | PRN

## 2012-08-03 MED ORDER — CEFAZOLIN SODIUM-DEXTROSE 2-3 GM-% IV SOLR
2.0000 g | Freq: Four times a day (QID) | INTRAVENOUS | Status: AC
  Administered 2012-08-03 (×2): 2 g via INTRAVENOUS
  Filled 2012-08-03 (×3): qty 50

## 2012-08-03 MED ORDER — ONDANSETRON HCL 4 MG/2ML IJ SOLN: 4.0000 mg | Freq: Four times a day (QID) | INTRAMUSCULAR | Status: DC | PRN

## 2012-08-03 MED ORDER — MIDAZOLAM HCL 5 MG/5ML IJ SOLN
INTRAMUSCULAR | Status: DC | PRN
  Administered 2012-08-03: 2 mg via INTRAVENOUS

## 2012-08-03 MED ORDER — SERTRALINE HCL 100 MG PO TABS
100.0000 mg | ORAL_TABLET | Freq: Every day | ORAL | Status: DC
  Administered 2012-08-04 – 2012-08-05 (×2): 100 mg via ORAL
  Filled 2012-08-03 (×3): qty 1

## 2012-08-03 MED ORDER — PROMETHAZINE HCL 25 MG/ML IJ SOLN: 6.2500 mg | INTRAMUSCULAR | Status: DC | PRN

## 2012-08-03 MED ORDER — LACTATED RINGERS IV SOLN
INTRAVENOUS | Status: DC
  Administered 2012-08-03: 11:00:00 via INTRAVENOUS

## 2012-08-03 MED ORDER — HYDROMORPHONE HCL PF 1 MG/ML IJ SOLN
INTRAMUSCULAR | Status: AC
  Filled 2012-08-03: qty 1

## 2012-08-03 MED ORDER — FENTANYL CITRATE 0.05 MG/ML IJ SOLN
INTRAMUSCULAR | Status: DC | PRN
  Administered 2012-08-03 (×5): 50 ug via INTRAVENOUS

## 2012-08-03 MED ORDER — ALUM & MAG HYDROXIDE-SIMETH 200-200-20 MG/5ML PO SUSP: 30.0000 mL | ORAL | Status: DC | PRN

## 2012-08-03 MED ORDER — ARTIFICIAL TEARS OP OINT
TOPICAL_OINTMENT | OPHTHALMIC | Status: DC | PRN
  Administered 2012-08-03: 1 via OPHTHALMIC

## 2012-08-03 MED ORDER — HYDROCODONE-ACETAMINOPHEN 5-325 MG PO TABS
1.0000 | ORAL_TABLET | ORAL | Status: DC | PRN
  Administered 2012-08-03: 2 via ORAL
  Administered 2012-08-04: 1 via ORAL
  Administered 2012-08-04 (×3): 2 via ORAL
  Administered 2012-08-05: 1 via ORAL
  Filled 2012-08-03: qty 2
  Filled 2012-08-03 (×2): qty 1
  Filled 2012-08-03: qty 2
  Filled 2012-08-03: qty 1
  Filled 2012-08-03 (×2): qty 2

## 2012-08-03 MED ORDER — ACETAMINOPHEN 650 MG RE SUPP: 650.0000 mg | Freq: Four times a day (QID) | RECTAL | Status: DC | PRN

## 2012-08-03 MED ORDER — DEXAMETHASONE SODIUM PHOSPHATE 4 MG/ML IJ SOLN
INTRAMUSCULAR | Status: DC | PRN
  Administered 2012-08-03: 10 mg via INTRAVENOUS

## 2012-08-03 MED ORDER — METHOCARBAMOL 100 MG/ML IJ SOLN
500.0000 mg | INTRAVENOUS | Status: AC
  Filled 2012-08-03 (×2): qty 5

## 2012-08-03 MED ORDER — SENNOSIDES-DOCUSATE SODIUM 8.6-50 MG PO TABS: 1.0000 | ORAL_TABLET | Freq: Every evening | ORAL | Status: DC | PRN

## 2012-08-03 MED ORDER — DOCUSATE SODIUM 100 MG PO CAPS
100.0000 mg | ORAL_CAPSULE | Freq: Two times a day (BID) | ORAL | Status: DC
  Administered 2012-08-03 – 2012-08-05 (×4): 100 mg via ORAL
  Filled 2012-08-03 (×5): qty 1

## 2012-08-03 MED ORDER — METOCLOPRAMIDE HCL 5 MG/ML IJ SOLN
5.0000 mg | Freq: Three times a day (TID) | INTRAMUSCULAR | Status: DC | PRN
Start: 2012-08-03 — End: 2012-08-05

## 2012-08-03 MED ORDER — FENTANYL CITRATE 0.05 MG/ML IJ SOLN
50.0000 ug | INTRAMUSCULAR | Status: DC | PRN
  Administered 2012-08-03: 100 ug via INTRAVENOUS

## 2012-08-03 MED ORDER — METOCLOPRAMIDE HCL 10 MG PO TABS
5.0000 mg | ORAL_TABLET | Freq: Three times a day (TID) | ORAL | Status: DC | PRN
Start: 2012-08-03 — End: 2012-08-05

## 2012-08-03 MED ORDER — FERROUS SULFATE 325 (65 FE) MG PO TABS
325.0000 mg | ORAL_TABLET | Freq: Every day | ORAL | Status: DC
  Administered 2012-08-04 – 2012-08-05 (×2): 325 mg via ORAL
  Filled 2012-08-03 (×3): qty 1

## 2012-08-03 MED ORDER — OXYCODONE HCL 5 MG/5ML PO SOLN: 5.0000 mg | Freq: Once | ORAL | Status: DC | PRN

## 2012-08-03 MED ORDER — CEFUROXIME SODIUM 1.5 G IJ SOLR
INTRAMUSCULAR | Status: DC | PRN
  Administered 2012-08-03: 1.5 g

## 2012-08-03 MED ORDER — DIPHENHYDRAMINE HCL 12.5 MG/5ML PO ELIX: 12.5000 mg | ORAL_SOLUTION | ORAL | Status: DC | PRN

## 2012-08-03 MED ORDER — BUPIVACAINE-EPINEPHRINE PF 0.5-1:200000 % IJ SOLN
INTRAMUSCULAR | Status: DC | PRN
  Administered 2012-08-03: 150 mg

## 2012-08-03 MED ORDER — ACETAMINOPHEN 325 MG PO TABS: 650.0000 mg | ORAL_TABLET | Freq: Four times a day (QID) | ORAL | Status: DC | PRN

## 2012-08-03 MED ORDER — HYDROMORPHONE HCL PF 1 MG/ML IJ SOLN
0.5000 mg | INTRAMUSCULAR | Status: DC | PRN
Start: 2012-08-03 — End: 2012-08-05

## 2012-08-03 MED ORDER — LACTATED RINGERS IV SOLN
INTRAVENOUS | Status: DC | PRN
  Administered 2012-08-03: 12:00:00 via INTRAVENOUS

## 2012-08-03 MED ORDER — HYDROMORPHONE HCL PF 1 MG/ML IJ SOLN
0.2500 mg | INTRAMUSCULAR | Status: DC | PRN
  Administered 2012-08-03 (×4): 0.5 mg via INTRAVENOUS

## 2012-08-03 MED ORDER — MENTHOL 3 MG MT LOZG: 1.0000 | LOZENGE | OROMUCOSAL | Status: DC | PRN

## 2012-08-03 MED ORDER — ZOLPIDEM TARTRATE 5 MG PO TABS: 5.0000 mg | ORAL_TABLET | Freq: Every evening | ORAL | Status: DC | PRN

## 2012-08-03 MED ORDER — PANTOPRAZOLE SODIUM 40 MG PO TBEC
40.0000 mg | DELAYED_RELEASE_TABLET | Freq: Every day | ORAL | Status: DC
  Administered 2012-08-03 – 2012-08-04 (×2): 40 mg via ORAL
  Filled 2012-08-03 (×2): qty 1

## 2012-08-03 MED ORDER — ASPIRIN EC 325 MG PO TBEC
325.0000 mg | DELAYED_RELEASE_TABLET | Freq: Two times a day (BID) | ORAL | Status: DC
  Administered 2012-08-04 – 2012-08-05 (×3): 325 mg via ORAL
  Filled 2012-08-03 (×5): qty 1

## 2012-08-03 MED ORDER — LISINOPRIL 10 MG PO TABS
10.0000 mg | ORAL_TABLET | Freq: Every day | ORAL | Status: DC
Start: 2012-08-03 — End: 2012-08-05
  Administered 2012-08-04 – 2012-08-05 (×2): 10 mg via ORAL
  Filled 2012-08-03 (×3): qty 1

## 2012-08-03 MED ORDER — ONDANSETRON HCL 4 MG PO TABS: 4.0000 mg | ORAL_TABLET | Freq: Four times a day (QID) | ORAL | Status: DC | PRN

## 2012-08-03 MED ORDER — INSULIN ASPART 100 UNIT/ML ~~LOC~~ SOLN
0.0000 [IU] | Freq: Three times a day (TID) | SUBCUTANEOUS | Status: DC
  Administered 2012-08-04: 2 [IU] via SUBCUTANEOUS
  Administered 2012-08-04: 3 [IU] via SUBCUTANEOUS

## 2012-08-03 MED ORDER — METHOCARBAMOL 100 MG/ML IJ SOLN
500.0000 mg | Freq: Four times a day (QID) | INTRAVENOUS | Status: DC | PRN
  Administered 2012-08-03: 500 mg via INTRAVENOUS
  Filled 2012-08-03: qty 5

## 2012-08-03 MED ORDER — PROPOFOL 10 MG/ML IV BOLUS
INTRAVENOUS | Status: DC | PRN
  Administered 2012-08-03: 200 mg via INTRAVENOUS

## 2012-08-03 MED ORDER — ONDANSETRON HCL 4 MG/2ML IJ SOLN
INTRAMUSCULAR | Status: DC | PRN
  Administered 2012-08-03: 4 mg via INTRAVENOUS

## 2012-08-03 MED ORDER — LINAGLIPTIN 5 MG PO TABS
5.0000 mg | ORAL_TABLET | Freq: Every day | ORAL | Status: DC
  Administered 2012-08-04 – 2012-08-05 (×2): 5 mg via ORAL
  Filled 2012-08-03 (×3): qty 1

## 2012-08-03 MED ORDER — MIDAZOLAM HCL 2 MG/2ML IJ SOLN
INTRAMUSCULAR | Status: AC
  Filled 2012-08-03: qty 2

## 2012-08-03 MED ORDER — FENTANYL CITRATE 0.05 MG/ML IJ SOLN
INTRAMUSCULAR | Status: AC
  Filled 2012-08-03: qty 2

## 2012-08-03 MED ORDER — LISINOPRIL 10 MG PO TABS: 10.0000 mg | ORAL_TABLET | Freq: Every day | ORAL | Status: DC

## 2012-08-03 MED ORDER — PHENOL 1.4 % MT LIQD: 1.0000 | OROMUCOSAL | Status: DC | PRN

## 2012-08-03 SURGICAL SUPPLY — 62 items
BANDAGE ELASTIC 4 VELCRO ST LF (GAUZE/BANDAGES/DRESSINGS) ×2 IMPLANT
BANDAGE ELASTIC 6 VELCRO ST LF (GAUZE/BANDAGES/DRESSINGS) ×1 IMPLANT
BANDAGE ESMARK 6X9 LF (GAUZE/BANDAGES/DRESSINGS) ×1 IMPLANT
BANDAGE GAUZE ELAST BULKY 4 IN (GAUZE/BANDAGES/DRESSINGS) ×3 IMPLANT
BLADE SAGITTAL 25.0X1.19X90 (BLADE) ×2 IMPLANT
BLADE SURG ROTATE 9660 (MISCELLANEOUS) IMPLANT
BNDG CMPR 9X6 STRL LF SNTH (GAUZE/BANDAGES/DRESSINGS) ×1
BNDG CMPR MED 10X6 ELC LF (GAUZE/BANDAGES/DRESSINGS) ×1
BNDG ELASTIC 6X10 VLCR STRL LF (GAUZE/BANDAGES/DRESSINGS) ×2 IMPLANT
BNDG ESMARK 6X9 LF (GAUZE/BANDAGES/DRESSINGS) ×2
BOWL SMART MIX CTS (DISPOSABLE) ×2 IMPLANT
CEMENT HV SMART SET (Cement) ×3 IMPLANT
CLOTH BEACON ORANGE TIMEOUT ST (SAFETY) ×2 IMPLANT
CLSR STERI-STRIP ANTIMIC 1/2X4 (GAUZE/BANDAGES/DRESSINGS) ×1 IMPLANT
COVER SURGICAL LIGHT HANDLE (MISCELLANEOUS) ×2 IMPLANT
CUFF TOURNIQUET SINGLE 34IN LL (TOURNIQUET CUFF) ×2 IMPLANT
CUFF TOURNIQUET SINGLE 44IN (TOURNIQUET CUFF) IMPLANT
DRAPE EXTREMITY T 121X128X90 (DRAPE) ×2 IMPLANT
DRAPE PROXIMA HALF (DRAPES) ×2 IMPLANT
DRAPE U-SHAPE 47X51 STRL (DRAPES) ×2 IMPLANT
DRSG ADAPTIC 3X8 NADH LF (GAUZE/BANDAGES/DRESSINGS) ×2 IMPLANT
DRSG PAD ABDOMINAL 8X10 ST (GAUZE/BANDAGES/DRESSINGS) ×2 IMPLANT
DURAPREP 26ML APPLICATOR (WOUND CARE) ×2 IMPLANT
ELECT REM PT RETURN 9FT ADLT (ELECTROSURGICAL) ×2
ELECTRODE REM PT RTRN 9FT ADLT (ELECTROSURGICAL) ×1 IMPLANT
FACESHIELD LNG OPTICON STERILE (SAFETY) ×4 IMPLANT
GLOVE BIO SURGEON STRL SZ8.5 (GLOVE) ×2 IMPLANT
GLOVE BIOGEL PI IND STRL 8 (GLOVE) ×1 IMPLANT
GLOVE BIOGEL PI IND STRL 8.5 (GLOVE) ×1 IMPLANT
GLOVE BIOGEL PI INDICATOR 8 (GLOVE) ×1
GLOVE BIOGEL PI INDICATOR 8.5 (GLOVE) ×1
GLOVE SS BIOGEL STRL SZ 8 (GLOVE) ×1 IMPLANT
GLOVE SUPERSENSE BIOGEL SZ 8 (GLOVE) ×1
GOWN PREVENTION PLUS XLARGE (GOWN DISPOSABLE) ×2 IMPLANT
GOWN PREVENTION PLUS XXLARGE (GOWN DISPOSABLE) ×2 IMPLANT
GOWN STRL NON-REIN LRG LVL3 (GOWN DISPOSABLE) ×2 IMPLANT
HANDPIECE INTERPULSE COAX TIP (DISPOSABLE) ×2
HOOD PEEL AWAY FACE SHEILD DIS (HOOD) ×2 IMPLANT
IMMOBILIZER KNEE 20 (SOFTGOODS)
IMMOBILIZER KNEE 20 THIGH 36 (SOFTGOODS) IMPLANT
IMMOBILIZER KNEE 22 UNIV (SOFTGOODS) ×2 IMPLANT
IMMOBILIZER KNEE 24 THIGH 36 (MISCELLANEOUS) IMPLANT
IMMOBILIZER KNEE 24 UNIV (MISCELLANEOUS)
KIT BASIN OR (CUSTOM PROCEDURE TRAY) ×2 IMPLANT
KIT ROOM TURNOVER OR (KITS) ×2 IMPLANT
MANIFOLD NEPTUNE II (INSTRUMENTS) ×2 IMPLANT
NS IRRIG 1000ML POUR BTL (IV SOLUTION) ×2 IMPLANT
PACK TOTAL JOINT (CUSTOM PROCEDURE TRAY) ×2 IMPLANT
PAD ARMBOARD 7.5X6 YLW CONV (MISCELLANEOUS) ×4 IMPLANT
SET HNDPC FAN SPRY TIP SCT (DISPOSABLE) ×1 IMPLANT
SPONGE GAUZE 4X4 12PLY (GAUZE/BANDAGES/DRESSINGS) ×2 IMPLANT
STAPLER VISISTAT 35W (STAPLE) ×2 IMPLANT
SUCTION FRAZIER TIP 10 FR DISP (SUCTIONS) IMPLANT
SUT VIC AB 0 CT1 27 (SUTURE) ×4
SUT VIC AB 0 CT1 27XBRD ANBCTR (SUTURE) ×2 IMPLANT
SUT VIC AB 2-0 CT1 27 (SUTURE) ×4
SUT VIC AB 2-0 CT1 TAPERPNT 27 (SUTURE) ×2 IMPLANT
SUT VLOC 180 0 24IN GS25 (SUTURE) ×2 IMPLANT
TOWEL OR 17X24 6PK STRL BLUE (TOWEL DISPOSABLE) ×2 IMPLANT
TOWEL OR 17X26 10 PK STRL BLUE (TOWEL DISPOSABLE) ×2 IMPLANT
TRAY FOLEY CATH 14FR (SET/KITS/TRAYS/PACK) ×2 IMPLANT
WATER STERILE IRR 1000ML POUR (IV SOLUTION) ×6 IMPLANT

## 2012-08-03 NOTE — Transfer of Care (Signed)
Immediate Anesthesia Transfer of Care Note  Patient: William Stokes  Procedure(s) Performed: Procedure(s) (LRB) with comments: TOTAL KNEE ARTHROPLASTY (Right)  Patient Location: PACU  Anesthesia Type:General  Level of Consciousness: awake, alert  and oriented  Airway & Oxygen Therapy: Patient Spontanous Breathing and Patient connected to nasal cannula oxygen  Post-op Assessment: Report given to PACU RN, Post -op Vital signs reviewed and stable and Patient moving all extremities X 4  Post vital signs: Reviewed and stable  Complications: No apparent anesthesia complications

## 2012-08-03 NOTE — Anesthesia Postprocedure Evaluation (Signed)
Anesthesia Post Note  Patient: William Stokes  Procedure(s) Performed: Procedure(s) (LRB): TOTAL KNEE ARTHROPLASTY (Right)  Anesthesia type: general  Patient location: PACU  Post pain: Pain level controlled  Post assessment: Patient's Cardiovascular Status Stable  Last Vitals:  Filed Vitals:   08/03/12 1530  BP:   Pulse: 83  Temp:   Resp: 29    Post vital signs: Reviewed and stable  Level of consciousness: sedated  Complications: No apparent anesthesia complications

## 2012-08-03 NOTE — Op Note (Signed)
PREOP DIAGNOSIS: DJD RIGHT KNEE POSTOP DIAGNOSIS: DJD RIGHT KNEE PROCEDURE: RIGHT TKR ANESTHESIA: General and block ATTENDING SURGEON: Lasheika Ortloff G ASSISTANT: Lindwood Qua PA  INDICATIONS FOR PROCEDURE: William Stokes is a 66 y.o. male who has struggled for a long time with pain due to degenerative arthritis of the right knee.  The patient has failed many conservative non-operative measures and at this point has pain which limits the ability to sleep and walk.  The patient is offered total knee replacement.  Informed operative consent was obtained after discussion of possible risks of anesthesia, infection, neurovascular injury, DVT, and death.  The importance of the post-operative rehabilitation protocol to optimize result was stressed extensively with the patient.  SUMMARY OF FINDINGS AND PROCEDURE:  VIOLET CART was taken to the operative suite where under the above anesthesia a right knee replacement was performed.  There were advanced degenerative changes and the bone quality was excellent.  We used the DePuy system and placed size large femur, tibia, 41 mm all polyethylene patella, and a size 10 spacer.  I did include Zinacef antibiotic in the cement.  The patient was admitted for appropriate post-op care to include perioperative antibiotics and mechanical and pharmacologic measures for DVT prophylaxis.  DESCRIPTION OF PROCEDURE:  BOMANI OOMMEN was taken to the operative suite where the above anesthesia was applied.  The patient was positioned supine and prepped and draped in normal sterile fashion.  An appropriate time out was performed.  After the administration of Kefzol pre-op antibiotic the leg was elevated and exsanguinated and a tourniquet inflated. A standard longitudinal incision was made on the anterior knee.  Dissection was carried down to the extensor mechanism.  All appropriate anti-infective measures were used including the pre-operative antibiotic, betadine impregnated  drape, and closed hooded exhaust systems for each member of the surgical team.  A medial parapatellar incision was made in the extensor mechanism and the knee cap flipped and the knee flexed.  Some residual meniscal tissues were removed along with any remaining ACL/PCL tissue.  A guide was placed on the tibia and a flat cut was made on it's superior surface.  An intramedullary guide was placed in the femur and was utilized to make anterior and posterior cuts creating an appropriate flexion gap.  A second intramedullary guide was placed in the femur to make a distal cut properly balancing the knee with an extension gap equal to the flexion gap.  The three bones sized to the above mentioned sizes and the appropriate guides were placed and utilized.  A trial reduction was done and the knee easily came to full extension and the patella tracked well on flexion.  The trial components were removed and all bones were cleaned with pulsatile lavage and then dried thoroughly.  Cement was mixed including antibiotic and was pressurized onto the bones followed by placement of the aforementioned components.  Excess cement was trimmed and pressure was held on the components until the cement had hardened.  The tourniquet was deflated and a small amount of bleeding was controlled with cautery and pressure.  The knee was irrigated thoroughly.  The extensor mechanism was re-approximated with V-loc suture in running fashion.  The knee was flexed and the repair was solid.  The subcutaneous tissues were re-approximated with #0 and #2-0 vicryl and the skin closed with a subcuticular stitch and steristrips.  A sterile dressing was applied.  Intraoperative fluids, EBL, and tourniquet time can be obtained from anesthesia records.  DISPOSITION:  The patient was taken to recovery room in stable condition and admitted for appropriate post-op care to include peri-operative antibiotic and DVT prophylaxis with mechanical and pharmacologic  measures.  Kasten Leveque G 08/03/2012, 1:53 PM

## 2012-08-03 NOTE — Interval H&P Note (Signed)
History and Physical Interval Note:  08/03/2012 11:28 AM  William Stokes  has presented today for surgery, with the diagnosis of RIGHT KNEE DEGENERATIVE JOINT DISEASE  The various methods of treatment have been discussed with the patient and family. After consideration of risks, benefits and other options for treatment, the patient has consented to  Procedure(s) (LRB) with comments: TOTAL KNEE ARTHROPLASTY (Right) as a surgical intervention .  The patient's history has been reviewed, patient examined, no change in status, stable for surgery.  I have reviewed the patient's chart and labs.  Questions were answered to the patient's satisfaction.     [name]

## 2012-08-03 NOTE — Preoperative (Signed)
Beta Blockers   Reason not to administer Beta Blockers: not prescribed 

## 2012-08-03 NOTE — Progress Notes (Signed)
Orthopedic Tech Progress Note Patient Details:  William Stokes March 07, 1946 [id number]  CPM Right Knee CPM Right Knee: On Right Knee Flexion (Degrees): 60  Right Knee Extension (Degrees): 0  Additional Comments: TRAPEZE BAR   Shawnie Pons 08/03/2012, 4:06 PM

## 2012-08-03 NOTE — Anesthesia Procedure Notes (Addendum)
Anesthesia Regional Block:  Femoral nerve block  Pre-Anesthetic Checklist: ,, timeout performed, Correct Patient, Correct Site, Correct Laterality, Correct Procedure, Correct Position, site marked, Risks and benefits discussed,  Surgical consent,  Pre-op evaluation,  At surgeon's request and post-op pain management  Laterality: Right  Prep: chloraprep       Needles:  Injection technique: Single-shot  Needle Type: Echogenic Stimulator Needle     Needle Length: 5cm 5 cm Needle Gauge: 22 and 22 G    Additional Needles:  Procedures: ultrasound guided (picture in chart) and nerve stimulator Femoral nerve block  Nerve Stimulator or Paresthesia:  Response: quadraceps contraction, 0.45 mA,   Additional Responses:   Narrative:  Start time: 08/03/2012 11:14 AM End time: 08/03/2012 11:24 AM Injection made incrementally with aspirations every 5 mL.  Performed by: Personally  Anesthesiologist: Halford Decamp, MD  Additional Notes: Functioning IV was confirmed and monitors were applied.  A 50mm 22ga Arrow echogenic stimulator needle was used. Sterile prep and drape,hand hygiene and sterile gloves were used. Ultrasound guidance: relevant anatomy identified, needle position confirmed, local anesthetic spread visualized around nerve(s)., vascular puncture avoided.  Image printed for medical record. Negative aspiration and negative test dose prior to incremental administration of local anesthetic. The patient tolerated the procedure well.    Femoral nerve block Procedure Name: LMA Insertion Date/Time: 08/03/2012 12:17 PM Performed by: Gayla Medicus Pre-anesthesia Checklist: Patient identified, Timeout performed, Emergency Drugs available, Suction available and Patient being monitored Patient Re-evaluated:Patient Re-evaluated prior to inductionOxygen Delivery Method: Circle system utilized Preoxygenation: Pre-oxygenation with 100% oxygen Intubation Type: IV induction LMA: LMA with  gastric port inserted LMA Size: 4.0 Number of attempts: 1 Placement Confirmation: positive ETCO2 and breath sounds checked- equal and bilateral Tube secured with: Tape Dental Injury: Teeth and Oropharynx as per pre-operative assessment

## 2012-08-03 NOTE — Anesthesia Preprocedure Evaluation (Signed)
Anesthesia Evaluation  Patient identified by MRN, date of birth, ID band Patient awake    Reviewed: Allergy & Precautions, H&P , NPO status , Patient's Chart, lab work & pertinent test results  History of Anesthesia Complications Negative for: history of anesthetic complications  Airway Mallampati: III TM Distance: >3 FB Neck ROM: full    Dental  (+) Poor Dentition and Dental Advisory Given   Pulmonary sleep apnea and Continuous Positive Airway Pressure Ventilation ,  breath sounds clear to auscultation  Pulmonary exam normal       Cardiovascular hypertension, Pt. on medications + dysrhythmias Rhythm:regular Rate:Normal     Neuro/Psych PSYCHIATRIC DISORDERS Anxiety Depression    GI/Hepatic GERD-  Medicated,  Endo/Other  diabetes, Well Controlled, Type obesity  Renal/GU      Musculoskeletal   Abdominal (+)  Abdomen: soft. Bowel sounds: normal.  Peds  Hematology   Anesthesia Other Findings   Reproductive/Obstetrics                           Anesthesia Physical Anesthesia Plan  ASA: II  Anesthesia Plan: General   Post-op Pain Management:    Induction: Intravenous  Airway Management Planned: LMA  Additional Equipment:   Intra-op Plan:   Post-operative Plan: Extubation in OR  Informed Consent: I have reviewed the patients History and Physical, chart, labs and discussed the procedure including the risks, benefits and alternatives for the proposed anesthesia with the patient or authorized representative who has indicated his/her understanding and acceptance.   Dental advisory given  Plan Discussed with: CRNA, Anesthesiologist and Surgeon  Anesthesia Plan Comments:         Anesthesia Quick Evaluation

## 2012-08-04 LAB — GLUCOSE, CAPILLARY
Glucose-Capillary: 163 mg/dL — ABNORMAL HIGH (ref 70–99)
Glucose-Capillary: 128 mg/dL — ABNORMAL HIGH (ref 70–99)

## 2012-08-04 LAB — BASIC METABOLIC PANEL
CO2: 30 mEq/L (ref 19–32)
Calcium: 9.1 mg/dL (ref 8.4–10.5)
Creatinine, Ser: 0.89 mg/dL (ref 0.50–1.35)
Glucose, Bld: 114 mg/dL — ABNORMAL HIGH (ref 70–99)
Sodium: 135 mEq/L (ref 135–145)

## 2012-08-04 LAB — CBC
Hemoglobin: 12.3 g/dL — ABNORMAL LOW (ref 13.0–17.0)
MCH: 27 pg (ref 26.0–34.0)
MCV: 82.5 fL (ref 78.0–100.0)
RBC: 4.56 MIL/uL (ref 4.22–5.81)

## 2012-08-04 MED ORDER — ASPIRIN 325 MG PO TBEC: 325.0000 mg | DELAYED_RELEASE_TABLET | Freq: Two times a day (BID) | ORAL | Status: DC

## 2012-08-04 MED ORDER — HYDROCODONE-ACETAMINOPHEN 5-325 MG PO TABS: 1.0000 | ORAL_TABLET | ORAL | Status: DC | PRN

## 2012-08-04 MED ORDER — HYDROCODONE-ACETAMINOPHEN 5-325 MG PO TABS: 2.0000 | ORAL_TABLET | ORAL | Status: DC | PRN

## 2012-08-04 NOTE — Progress Notes (Signed)
Subjective: 1 Day Post-Op Procedure(s) (LRB): TOTAL KNEE ARTHROPLASTY (Right)  Activity level:  wbat Diet tolerance:  ok Voiding:  ok Patient reports pain as 2 on 0-10 scale.    Objective: Vital signs in last 24 hours: Temp:  [98.2 F (36.8 C)-98.4 F (36.9 C)] 98.4 F (36.9 C) (12/20 0630) Pulse Rate:  [71-91] 71  (12/20 0630) Resp:  [11-29] 18  (12/20 0630) BP: (109-141)/(73-87) 127/80 mmHg (12/20 0630) SpO2:  [92 %-100 %] 93 % (12/20 0630)  Labs:  Basename 08/04/12 0600  HGB 12.3*    Basename 08/04/12 0600  WBC 8.4  RBC 4.56  HCT 37.6*  PLT 392    Basename 08/04/12 0600  NA 135  K 4.4  CL 98  CO2 30  BUN 14  CREATININE 0.89  GLUCOSE 114*  CALCIUM 9.1   No results found for this basename: LABPT:2,INR:2 in the last 72 hours  Physical Exam:  Neurologically intact ABD soft Neurovascular intact Sensation intact distally Intact pulses distally Dorsiflexion/Plantar flexion intact Incision: dressing C/D/I No cellulitis present Compartment soft  Assessment/Plan:  1 Day Post-Op Procedure(s) (LRB): TOTAL KNEE ARTHROPLASTY (Right) Advance diet Up with therapy D/C IV fluids Plan for discharge tomorrow with HH-pt ASA 325 BID x 2 weeks  RTO 2 weeks  RX's in chart    Koray Soter R 08/04/2012, 8:06 AM

## 2012-08-04 NOTE — Progress Notes (Signed)
CARE MANAGEMENT NOTE 08/04/2012  Patient:  William Stokes, William Stokes   Account Number:  [id number]  Date Initiated:  08/04/2012  Documentation initiated by:  Vance Peper  Subjective/Objective Assessment:   67 yr old male s/p right total knee arthroplasty.     Action/Plan:   CM spoke with patient concerning Home Health and DME needs at discharge.Patient preoperatively setup with [facility], no changes. Has rolling walker, 3in1, CPM to be delivered by [employer].   Anticipated DC Date:  08/05/2012   Anticipated DC Plan:  HOME W HOME HEALTH SERVICES      DC Planning Services  CM consult      [facility] Choice  HOME HEALTH   Choice offered to / List presented to:  C-1 Patient        HH arranged  HH-2 PT      [facility] agency  [facility]   Status of service:  Completed, signed off Medicare Important Message given?   (If response is "NO", the following Medicare IM given date fields will be blank) Date Medicare IM given:   Date Additional Medicare IM given:    Discharge Disposition:  HOME W HOME HEALTH SERVICES  Per UR Regulation:    If discussed at Long Length of Stay Meetings, dates discussed:    Comments:

## 2012-08-04 NOTE — Progress Notes (Addendum)
Physical Therapy Evaluation Patient Details Name: William Stokes MRN: [id number] DOB: [birth year] Today's Date: 08/04/2012 Time: 0912-0946 PT Time Calculation (min): 34 min  PT Assessment / Plan / Recommendation Clinical Impression  Pt is a 67 y/o male s/p R TKA. Pt had L TKA a few months ago and still has all the adaptive equipment.  Pt will benefit from acute PT intervention to promote functional mobility for d/c to home.        PT Assessment  Patient needs continued PT services    Follow Up Recommendations  Home health PT    Barriers to Discharge None      Equipment Recommendations  None recommended by PT    Recommendations for Other Services     Frequency 7X/week    Precautions / Restrictions Precautions Precautions: Knee Required Braces or Orthoses: Knee Immobilizer - Right Restrictions Weight Bearing Restrictions: Yes   Pertinent Vitals/Pain 6/10 pain in R knee pt premedicated      Mobility  Bed Mobility Bed Mobility: Supine to Sit Supine to Sit: 5: Supervision Details for Bed Mobility Assistance: no assist required Transfers Transfers: Sit to Stand;Stand to Sit Sit to Stand: From bed;4: Min guard;With upper extremity assist Stand to Sit: 4: Min guard;To chair/3-in-1;With upper extremity assist Details for Transfer Assistance: Verbal cueing for hand placement.  Pt a little unsteady initially secondary to dizziness.   Ambulation/Gait Ambulation/Gait Assistance: 4: Min guard Ambulation Distance (Feet): 30 Feet Assistive device: Rolling walker Ambulation/Gait Assistance Details: Min guard for safety as pt c/o worsening dizziness while ambulating.   Gait Pattern: Step-to pattern Stairs: No Wheelchair Mobility Wheelchair Mobility: No    Shoulder Instructions     Exercises Total Joint Exercises Ankle Circles/Pumps: 10 reps;Seated;Both Quad Sets: 10 reps;Right;Seated   PT Diagnosis: Difficulty walking;Generalized weakness;Acute pain  PT Problem List:  Decreased strength;Decreased range of motion;Decreased activity tolerance;Decreased mobility;Decreased knowledge of use of DME;Pain PT Treatment Interventions: DME instruction;Gait training;Stair training;Therapeutic activities;Functional mobility training;Therapeutic exercise;Patient/family education   PT Goals Acute Rehab PT Goals PT Goal Formulation: With patient Time For Goal Achievement: 08/11/12 Potential to Achieve Goals: Good Pt will go Supine/Side to Sit: with modified independence PT Goal: Supine/Side to Sit - Progress: Goal set today Pt will go Sit to Supine/Side: with modified independence PT Goal: Sit to Supine/Side - Progress: Goal set today Pt will Transfer Bed to Chair/Chair to Bed: with modified independence PT Transfer Goal: Bed to Chair/Chair to Bed - Progress: Goal set today Pt will Ambulate: >150 feet;with modified independence;with rolling walker PT Goal: Ambulate - Progress: Goal set today Pt will Perform Home Exercise Program: Independently PT Goal: Perform Home Exercise Program - Progress: Goal set today  Visit Information  Last PT Received On: 08/04/12 Assistance Needed: +1    Subjective Data  Subjective: agree to PT eval  Patient Stated Goal: Return to home   Prior Functioning  Home Living Lives With: Spouse Available Help at Discharge: Family;Available 24 hours/day Type of Home: House Home Access: Stairs to enter [employer] of Steps: 6 Entrance Stairs-Rails: Left Home Layout: One level Bathroom Shower/Tub: Walk-in shower;Door Home Adaptive Equipment: Bedside commode/3-in-1;Walker - rolling Prior Function Level of Independence: Independent with assistive device(s) (cane) Able to Take Stairs?: Yes Driving: Yes Vocation: Retired Musician: No difficulties    Cognition  Overall Cognitive Status: Appears within functional limits for tasks assessed/performed Arousal/Alertness: Awake/alert Orientation Level: Appears  intact for tasks assessed Behavior During Session: [facility] for tasks performed    Extremity/Trunk Assessment Right Lower  Extremity Assessment RLE ROM/Strength/Tone: Within functional levels Left Lower Extremity Assessment LLE ROM/Strength/Tone: Within functional levels Trunk Assessment Trunk Assessment: Normal   Balance    End of Session PT - End of Session Equipment Utilized During Treatment: Gait belt Activity Tolerance: Patient tolerated treatment well Patient left: in chair;with call bell/phone within reach Nurse Communication: Mobility status CPM Right Knee CPM Right Knee: Off Additional Comments: CPM off at 9:20  GP     Ashland Wiseman 08/04/2012, 12:08 PM Oather Muilenburg L. Corliss Coggeshall DPT [phone]

## 2012-08-04 NOTE — Progress Notes (Signed)
Physical Therapy Treatment Patient Details Name: William Stokes MRN: [id number] DOB: 08/25/1945 Today's Date: 08/04/2012 Time: 4098-1191 PT Time Calculation (min): 32 min  PT Assessment / Plan / Recommendation Comments on Treatment Session  Pt's BP dropped in standing and sitting.  Pt continues to be limited by dizziness.  Pt appears to be exhibiting signs of Orthostatic hypotension.  Notified RN. Placed pt in CPM 0-70 degrees.      Follow Up Recommendations  Home health PT     Does the patient have the potential to tolerate intense rehabilitation     Barriers to Discharge        Equipment Recommendations  None recommended by PT    Recommendations for Other Services    Frequency 7X/week   Plan Discharge plan remains appropriate;Frequency remains appropriate    Precautions / Restrictions Precautions Precautions: Knee Required Braces or Orthoses: Knee Immobilizer - Right Restrictions Weight Bearing Restrictions: Yes    Pertinent Vitals/Pain No c/o knee pain.     08/04/12 1450 08/04/12 1456 08/04/12 1500  Vital Signs  BP 93/63 mmHg ! 84/58 mmHg 118/68 mmHg  BP Location Right arm Right arm Right arm  BP Method Automatic Automatic Automatic  Patient Position, if appropriate Sitting Standing (sitting on bed after standing 2 minutes) Lying (in slight trendelenburg. )       Mobility  Bed Mobility Bed Mobility: Sit to Supine;Scooting to HOB Supine to Sit: Not tested (comment) Sit to Supine: 4: Min assist;HOB flat Scooting to HOB: 4: Min assist;With trapeze Details for Bed Mobility Assistance: Assist for R LE.  Transfers Transfers: Sit to Stand;Stand Pivot Transfers;Stand to Sit Sit to Stand: 4: Min assist;From chair/3-in-1;With upper extremity assist Stand to Sit: 4: Min assist;To bed;With upper extremity assist Stand Pivot Transfers: 4: Min guard Details for Transfer Assistance: Assist to stand.  Cues for hand placment and assist to position R LE to minimize pain when  sitting.  Ambulation/Gait Ambulation/Gait Assistance: Not tested (comment) Ambulation/Gait Assistance Details: Pt unable secondary to worsening dizziness in standing.     Exercises     PT Diagnosis:    PT Problem List:   PT Treatment Interventions:     PT Goals Acute Rehab PT Goals PT Goal Formulation: With patient Time For Goal Achievement: 08/11/12 Potential to Achieve Goals: Good Pt will go Supine/Side to Sit: with modified independence Pt will go Sit to Supine/Side: with modified independence PT Goal: Sit to Supine/Side - Progress: Progressing toward goal Pt will Transfer Bed to Chair/Chair to Bed: with modified independence PT Transfer Goal: Bed to Chair/Chair to Bed - Progress: Progressing toward goal Pt will Ambulate: >150 feet;with modified independence;with rolling walker Pt will Perform Home Exercise Program: Independently PT Goal: Perform Home Exercise Program - Progress: Progressing toward goal  Visit Information  Last PT Received On: 08/04/12 Assistance Needed: +1    Subjective Data  Subjective: I still feel whoozy Patient Stated Goal: Return to home   Cognition  Overall Cognitive Status: Appears within functional limits for tasks assessed/performed Arousal/Alertness: Awake/alert Orientation Level: Appears intact for tasks assessed Behavior During Session: [facility] for tasks performed    Balance  Balance Balance Assessed: Yes Static Standing Balance Static Standing - Balance Support: Bilateral upper extremity supported Static Standing - Level of Assistance: 3: Mod assist Static Standing - Comment/# of Minutes: repeated cueing and manual facilitation to shift pt's weight forward  End of Session PT - End of Session Equipment Utilized During Treatment: Gait belt;Right knee immobilizer Activity Tolerance: Treatment  limited secondary to medication Patient left: in bed;in CPM;with call bell/phone within reach;with bed alarm set;with family/visitor present Nurse  Communication: Mobility status CPM Right Knee CPM Right Knee: On Right Knee Flexion (Degrees): 70  Right Knee Extension (Degrees): 0    GP     Stephan Nelis 08/04/2012, 4:06 PM Willford Rabideau L. Aidynn Polendo DPT [phone]

## 2012-08-04 NOTE — Progress Notes (Signed)
Physical Therapy Treatment Patient Details Name: William Stokes MRN: [id number] DOB: Aug 18, 1945 Today's Date: 08/04/2012 Time: 4098-1191 PT Time Calculation (min): 27 min  PT Assessment / Plan / Recommendation Comments on Treatment Session  Pt mobility limited secondary to dizziness in standing. Pt stood aproximately 1 min and was unable to continue. After sitting pt in recliner pt had semi-syncopal episode.  (delayed response to verbal stimulus)  Wife (who is an Charity fundraiser) was present during session and reported pt did not do well with oral pain medication after his last surgery.  Pt took 2 Percocet prior to session. Wife reports pt unable to tolerate more than one.      Follow Up Recommendations  Home health PT     Does the patient have the potential to tolerate intense rehabilitation     Barriers to Discharge        Equipment Recommendations  None recommended by PT    Recommendations for Other Services    Frequency 7X/week   Plan Discharge plan remains appropriate;Frequency remains appropriate    Precautions / Restrictions Precautions Precautions: Knee Required Braces or Orthoses: Knee Immobilizer - Right Restrictions Weight Bearing Restrictions: Yes   Pertinent Vitals/Pain Pt with no c/o pain.      Mobility  Bed Mobility Bed Mobility: Not assessed Transfers Transfers: Sit to Stand;Stand to Sit Sit to Stand: 3: Mod assist;From chair/3-in-1;With upper extremity assist Stand to Sit: 4: Min assist;To chair/3-in-1;With upper extremity assist Details for Transfer Assistance: Assist to initiate standing secondary to LE weakness.  Cued pt to shift his weight forward as he has a tendency to lean back.  Pt required 3 attempts to achieve standing with KI on RLE.  Ambulation/Gait Ambulation/Gait Assistance: Not tested (comment) Ambulation/Gait Assistance Details: Pt unable secondary to worsening dizziness in standing.     Exercises     PT Diagnosis:    PT Problem List:   PT  Treatment Interventions:     PT Goals Acute Rehab PT Goals PT Goal Formulation: With patient Time For Goal Achievement: 08/11/12 Potential to Achieve Goals: Good Pt will go Supine/Side to Sit: with modified independence Pt will go Sit to Supine/Side: with modified independence Pt will Transfer Bed to Chair/Chair to Bed: with modified independence Pt will Ambulate: >150 feet;with modified independence;with rolling walker Pt will Perform Home Exercise Program: Independently  Visit Information  Last PT Received On: 08/04/12 Assistance Needed: +1    Subjective Data  Subjective: I have been in the chair all day.   Patient Stated Goal: Return to home   Cognition  Overall Cognitive Status: Appears within functional limits for tasks assessed/performed Arousal/Alertness: Awake/alert Orientation Level: Appears intact for tasks assessed Behavior During Session: [facility] for tasks performed    Balance     End of Session PT - End of Session Equipment Utilized During Treatment: Gait belt;Right knee immobilizer Activity Tolerance: Treatment limited secondary to medication Patient left: in chair;with call bell/phone within reach;with family/visitor present Nurse Communication: Mobility status   GP     William Stokes 08/04/2012, 3:55 PM William Aldama L. William Stokes DPT [phone]

## 2012-08-04 NOTE — Progress Notes (Signed)
OT Cancellation Note  Patient Details Name: William Stokes MRN: [id number] DOB: August 10, 1946   Cancelled Treatment:    Reason Eval/Treat Not Completed: Other (comment) (Pt does not need OT services at this time.) This is pt's second knee replacement and this therapist is familiar with him from the first surgery.  They feel confident with all techniques related to selfcare and have all needed DME.  Ciani Rutten 08/04/2012, 8:53 AM

## 2012-08-04 NOTE — Discharge Summary (Signed)
Patient ID: KACEN MELLINGER MRN: [id number] DOB/AGE: 10-24-1945 66 y.o.  Admit date: 08/03/2012 Discharge date: 08/05/2012  Admission Diagnoses:  Principal Problem:  *Right knee DJD   Discharge Diagnoses:  Same  Past Medical History  Diagnosis Date  . Osteoarthritis     left knee  . Hyperlipidemia     was on Crestor but has been off for a couple of months  . Glaucoma(365)   . Primary male hypogonadism   . Adenomatous colon polyp   . LAFB (left anterior fascicular block)     per baseline EKG 2009  . Anxiety   . PTSD (post-traumatic stress disorder)     went to [name], has chronic diff. sleeping  . Erectile dysfunction   . Hypertension 3/11    takes Lisinopril daily  . Sleep apnea, obstructive     uses CPAP and last sleep study in epic from 2011  . Joint pain   . Joint swelling   . Eczema     legs and hands  . GERD (gastroesophageal reflux disease)     takes OMeprazole daily  . Diabetes mellitus 2/09    takes Januvia-started 2wks ago  . Cataracts, bilateral     immature  . Glaucoma   . PTSD (post-traumatic stress disorder)     takes Zoloft daily  . Insomnia   . History of MRSA infection     6-52yrs ago  . History of staph infection     Surgeries: Procedure(s): TOTAL KNEE ARTHROPLASTY on 08/03/2012   Consultants:    Discharged Condition: Improved  Hospital Course: ADEL BURCH is an 66 y.o. male who was admitted 08/03/2012 for operative treatment ofRight knee DJD. Patient has severe unremitting pain that affects sleep, daily activities, and work/hobbies. After pre-op clearance the patient was taken to the operating room on 08/03/2012 and underwent  Procedure(s): TOTAL KNEE ARTHROPLASTY.    Patient was given perioperative antibiotics: Anti-infectives     Start     Dose/Rate Route Frequency Ordered Stop   08/03/12 1700   ceFAZolin (ANCEF) IVPB 2 g/50 mL premix        2 g 100 mL/hr over 30 Minutes Intravenous Every 6 hours 08/03/12 1635 08/03/12 2247   08/03/12 1253   cefUROXime (ZINACEF) injection  Status:  Discontinued          As needed 08/03/12 1254 08/03/12 1424   08/03/12 0600   ceFAZolin (ANCEF) 3 g in dextrose 5 % 50 mL IVPB        3 g 160 mL/hr over 30 Minutes Intravenous 60 min pre-op 08/02/12 1435 08/03/12 1220           Patient was given sequential compression devices, early ambulation, and chemoprophylaxis to prevent DVT.  Patient benefited maximally from hospital stay and there were no complications.    Recent vital signs: Patient Vitals for the past 24 hrs:  BP Temp Temp src Pulse Resp SpO2  08/04/12 0630 127/80 mmHg 98.4 F (36.9 C) - 71  18  93 %  08/04/12 0400 - - - - 16  -  08/04/12 0000 - - - - 16  -  08/03/12 2210 127/86 mmHg 98.3 F (36.8 C) - 90  18  95 %  08/03/12 2000 - - - - 16  -  08/03/12 1630 115/73 mmHg 98.2 F (36.8 C) - 89  16  97 %  08/03/12 1622 - - - 91  16  98 %  08/03/12 1620 121/80 mmHg - - - - -  08/03/12 1615 - - - 86  11  96 %  08/03/12 1605 139/73 mmHg - - - - -  08/03/12 1600 - - - 87  18  96 %  08/03/12 1549 109/83 mmHg - - - - -  08/03/12 1545 - - - 83  12  97 %  08/03/12 1535 131/83 mmHg - - - - -  08/03/12 1530 - - - 83  29  92 %  08/03/12 1524 - - - 84  19  94 %  08/03/12 1520 - - - 82  13  92 %  08/03/12 1519 125/78 mmHg - - - - -  08/03/12 1515 - - - 82  22  93 %  08/03/12 1505 126/82 mmHg - - - - -  08/03/12 1500 - - - 85  15  94 %  08/03/12 1450 141/82 mmHg - - - - -  08/03/12 1445 - - - 87  18  93 %  08/03/12 1435 139/78 mmHg 98.2 F (36.8 C) - 90  16  97 %  08/03/12 1430 - - - - - 97 %  08/03/12 1136 - - - 87  18  100 %  08/03/12 1109 - - - 83  20  98 %  08/03/12 0952 131/87 mmHg 98.2 F (36.8 C) Oral 86  20  93 %     Recent laboratory studies:  Basename 08/04/12 0600  WBC 8.4  HGB 12.3*  HCT 37.6*  PLT 392  NA 135  K 4.4  CL 98  CO2 30  BUN 14  CREATININE 0.89  GLUCOSE 114*  INR --  CALCIUM 9.1     Discharge Medications:     Medication  List     As of 08/04/2012  8:10 AM    STOP taking these medications         acetaminophen 500 MG tablet   Commonly known as: TYLENOL      aspirin 81 MG tablet      TAKE these medications         aspirin 325 MG EC tablet   Take 1 tablet (325 mg total) by mouth 2 (two) times daily.      HYDROcodone-acetaminophen 5-325 MG per tablet   Commonly known as: NORCO/VICODIN   Take 1-2 tablets by mouth every 4 (four) hours as needed (breakthrough pain).      lisinopril 20 MG tablet   Commonly known as: PRINIVIL,ZESTRIL   Take 0.5 tablets (10 mg total) by mouth daily.      lisinopril 20 MG tablet   Commonly known as: PRINIVIL,ZESTRIL   Take 10 mg by mouth daily.      omeprazole 40 MG capsule   Commonly known as: PRILOSEC   Take 1 capsule (40 mg total) by mouth daily.      sertraline 100 MG tablet   Commonly known as: ZOLOFT   Take 100 mg by mouth daily.      sitaGLIPtin 50 MG tablet   Commonly known as: JANUVIA   Take 1 tablet (50 mg total) by mouth daily.      tadalafil 20 MG tablet   Commonly known as: CIALIS   Take 20 mg by mouth every other day as needed. For E. D.        Diagnostic Studies: No results found.  Disposition: 06-Home-Health Care Svc      Discharge Orders    Future Appointments: Provider: Department: Dept Phone: Center:   09/19/2012 10:30 AM Elita Quick  Elisabeth Cara, MD [facility]  [place] [phone] [facility]   01/03/2013 10:00 AM Barbaraann Share, MD [facility] [phone] None      Follow-up Information    Follow up with DALLDORF,PETER G, MD. In 2 weeks.   Contact information:   [address] [place] [address] [phone]           Signed: Prince Rome 08/04/2012, 8:10 AM

## 2012-08-05 LAB — CBC
MCH: 26.9 pg (ref 26.0–34.0)
MCHC: 32.7 g/dL (ref 30.0–36.0)
MCV: 82.2 fL (ref 78.0–100.0)
Platelets: 373 10*3/uL (ref 150–400)
RDW: 14.5 % (ref 11.5–15.5)
WBC: 10 10*3/uL (ref 4.0–10.5)

## 2012-08-05 LAB — GLUCOSE, CAPILLARY: Glucose-Capillary: 136 mg/dL — ABNORMAL HIGH (ref 70–99)

## 2012-08-05 NOTE — Progress Notes (Addendum)
Physical Therapy Treatment Patient Details Name: William Stokes MRN: [id number] DOB: 08-21-1945 Today's Date: 08/05/2012 Time: 9147-8295 PT Time Calculation (min): 43 min  PT Assessment / Plan / Recommendation Comments on Treatment Session  Pt s/p rt TKA.  Pt continues to have drop in BP but not as low and pt asymptomatic.  Pt making slow progress.  Son will assist wife with pt on the stairs into the house.  Plans to dc today.    Follow Up Recommendations  Home health PT     Does the patient have the potential to tolerate intense rehabilitation     Barriers to Discharge        Equipment Recommendations  None recommended by PT    Recommendations for Other Services    Frequency 7X/week   Plan Discharge plan remains appropriate;Frequency remains appropriate    Precautions / Restrictions Precautions Precautions: Knee Required Braces or Orthoses: Knee Immobilizer - Right Knee Immobilizer - Right: On when out of bed or walking Restrictions Weight Bearing Restrictions: Yes RLE Weight Bearing: Weight bearing as tolerated   Pertinent Vitals/Pain See flowsheet for BP's.  Pain 6/10 in knee.  Pt had pain meds prior.    Mobility  Bed Mobility Supine to Sit: 3: Mod assist;HOB flat Details for Bed Mobility Assistance: Assist to move rt leg and bring trunk up Transfers Sit to Stand: 4: Min assist;With upper extremity assist;With armrests;From bed;From chair/3-in-1 Stand to Sit: 4: Min assist;With upper extremity assist;With armrests;To chair/3-in-1 Ambulation/Gait Ambulation Distance (Feet): 45 Feet Assistive device: Rolling walker Ambulation/Gait Assistance Details: Verbal cues for gait sequence and to stand more erect. Gait Pattern: Step-to pattern;Decreased step length - right;Decreased step length - left;Trunk flexed Stairs: Yes Stairs Assistance: 1: +2 Total assist Stairs Assistance Details (indicate cue type and reason): Assist for balance from behind and to stabilize walker in  front.  Verbal cues for technique. Wife present. Stair Management Technique: With walker;Backwards Number of Stairs:  (rt knee with buckling.  Knee immobilizer tightened)    Exercises Total Joint Exercises Ankle Circles/Pumps: Right;10 reps;Seated Quad Sets: Right;10 reps;Seated (tactile cues.  Fair quality.) Long Arc Quad: AAROM;Right;10 reps;Seated Goniometric ROM: 2-60 degrees rt knee.   PT Diagnosis:    PT Problem List:   PT Treatment Interventions:     PT Goals Acute Rehab PT Goals PT Goal: Supine/Side to Sit - Progress: Progressing toward goal PT Transfer Goal: Bed to Chair/Chair to Bed - Progress: Progressing toward goal PT Goal: Ambulate - Progress: Progressing toward goal PT Goal: Perform Home Exercise Program - Progress: Progressing toward goal  Visit Information  Last PT Received On: 08/05/12 Assistance Needed: +2 (for stairs)    Subjective Data  Subjective: Pt states he is going home today.   Cognition  Overall Cognitive Status: Appears within functional limits for tasks assessed/performed Arousal/Alertness: Awake/alert Orientation Level: Appears intact for tasks assessed Behavior During Session: [facility] for tasks performed    Balance  Static Standing Balance Static Standing - Balance Support: Bilateral upper extremity supported (on walker) Static Standing - Level of Assistance: 4: Min assist Static Standing - Comment/# of Minutes: initial tendency for posterior lean  End of Session PT - End of Session Equipment Utilized During Treatment: Gait belt;Right knee immobilizer Activity Tolerance: Patient limited by fatigue Patient left: in chair;with call bell/phone within reach;with family/visitor present Nurse Communication: Mobility status CPM Right Knee CPM Right Knee: Off   GP     William Stokes 08/05/2012, 1:45 PM  [facility] PT [phone]

## 2012-08-05 NOTE — Progress Notes (Signed)
Pt has sleep apnea in history.  Pt and wife states he will be fine without it during hospital stay because he usually doesn't wear cpap all night normally. Will continue to monitor throughout rest of night.

## 2012-08-05 NOTE — Progress Notes (Signed)
Patient ID: William Stokes, male   DOB: [birth year], 66 y.o.   MRN: [id number] PATIENT ID: William Stokes  MRN: [id number]  DOB/AGE:  [birth year] / 66 y.o.  2 Days Post-Op Procedure(s) (LRB): TOTAL KNEE ARTHROPLASTY (Right)    PROGRESS NOTE Subjective: Patient is alert, oriented, no Nausea, no Vomiting, yes passing gas, no Bowel Movement. Taking PO well. Denies SOB, Chest or Calf Pain. Using Incentive Spirometer, PAS in place. Ambulate WBAT in hall, CPM 0-55 Patient reports pain as 2 on 0-10 scale  .    Objective: Vital signs in last 24 hours: Filed Vitals:   08/04/12 1500 08/04/12 1600 08/04/12 2120 08/05/12 0706  BP: 118/68  135/71 120/68  Pulse:   84 78  Temp:   99.9 F (37.7 C) 98.3 F (36.8 C)  TempSrc:   Oral Oral  Resp:  18 18 18   SpO2:  96% 96% 97%      Intake/Output from previous day: I/O last 3 completed shifts: In: 155 [Other:100; IV Piggyback:55] Out: 2450 [Urine:2450]   Intake/Output this shift:     LABORATORY DATA:  Basename 08/05/12 0520 08/04/12 2111 08/04/12 1614 08/04/12 1102 08/04/12 0600  WBC 10.0 -- -- -- 8.4  HGB 11.6* -- -- -- 12.3*  HCT 35.5* -- -- -- 37.6*  PLT 373 -- -- -- 392  NA -- -- -- -- 135  K -- -- -- -- 4.4  CL -- -- -- -- 98  CO2 -- -- -- -- 30  BUN -- -- -- -- 14  CREATININE -- -- -- -- 0.89  GLUCOSE -- -- -- -- 114*  GLUCAP -- 128* 132* 163* --  INR -- -- -- -- --  CALCIUM -- -- -- -- 9.1    Examination: Neurologically intact ABD soft Neurovascular intact Sensation intact distally Intact pulses distally Dorsiflexion/Plantar flexion intact Incision: no drainage No cellulitis present Compartment soft}  Assessment:   2 Days Post-Op Procedure(s) (LRB): TOTAL KNEE ARTHROPLASTY (Right) ADDITIONAL DIAGNOSIS:  Diabetes, Hypertension and Sleep Apnea  Plan: PT/OT WBAT, CPM 5/hrs day until ROM 0-90 degrees, then D/C CPM DVT Prophylaxis:  SCDx72hrs, ASA 325 mg BID x 2 weeks DISCHARGE PLAN: Home DISCHARGE NEEDS: HHPT, HHRN, CPM,  Walker and 3-in-1 comode seat     Garth Diffley J 08/05/2012, 8:38 AM

## 2012-08-07 ENCOUNTER — Encounter (HOSPITAL_COMMUNITY): Payer: Self-pay | Admitting: Orthopaedic Surgery

## 2012-08-07 LAB — GLUCOSE, CAPILLARY: Glucose-Capillary: 119 mg/dL — ABNORMAL HIGH (ref 70–99)

## 2012-08-30 DIAGNOSIS — M25569 Pain in unspecified knee: Secondary | ICD-10-CM | POA: Diagnosis not present

## 2012-08-30 DIAGNOSIS — Z96659 Presence of unspecified artificial knee joint: Secondary | ICD-10-CM | POA: Diagnosis not present

## 2012-09-01 ENCOUNTER — Other Ambulatory Visit: Payer: Self-pay | Admitting: *Deleted

## 2012-09-01 DIAGNOSIS — Z96659 Presence of unspecified artificial knee joint: Secondary | ICD-10-CM | POA: Diagnosis not present

## 2012-09-01 DIAGNOSIS — M1710 Unilateral primary osteoarthritis, unspecified knee: Secondary | ICD-10-CM | POA: Diagnosis not present

## 2012-09-01 DIAGNOSIS — M25569 Pain in unspecified knee: Secondary | ICD-10-CM | POA: Diagnosis not present

## 2012-09-01 MED ORDER — FREESTYLE LANCETS MISC: Status: DC

## 2012-09-01 MED ORDER — GLUCOSE BLOOD VI STRP: ORAL_STRIP | Status: DC

## 2012-09-01 NOTE — Telephone Encounter (Signed)
Spoke with pt, refill done.

## 2012-09-01 NOTE — Telephone Encounter (Signed)
Wife called triage line wants script for One Touch Ultra Test Strips #50 with RF sent to pharmacy. Did not see on med list.

## 2012-09-05 DIAGNOSIS — Z96659 Presence of unspecified artificial knee joint: Secondary | ICD-10-CM | POA: Diagnosis not present

## 2012-09-05 DIAGNOSIS — M1710 Unilateral primary osteoarthritis, unspecified knee: Secondary | ICD-10-CM | POA: Diagnosis not present

## 2012-09-05 DIAGNOSIS — M25569 Pain in unspecified knee: Secondary | ICD-10-CM | POA: Diagnosis not present

## 2012-09-07 DIAGNOSIS — M25569 Pain in unspecified knee: Secondary | ICD-10-CM | POA: Diagnosis not present

## 2012-09-07 DIAGNOSIS — M1710 Unilateral primary osteoarthritis, unspecified knee: Secondary | ICD-10-CM | POA: Diagnosis not present

## 2012-09-07 DIAGNOSIS — Z96659 Presence of unspecified artificial knee joint: Secondary | ICD-10-CM | POA: Diagnosis not present

## 2012-09-12 DIAGNOSIS — Z96659 Presence of unspecified artificial knee joint: Secondary | ICD-10-CM | POA: Diagnosis not present

## 2012-09-12 DIAGNOSIS — M25569 Pain in unspecified knee: Secondary | ICD-10-CM | POA: Diagnosis not present

## 2012-09-14 DIAGNOSIS — Z96659 Presence of unspecified artificial knee joint: Secondary | ICD-10-CM | POA: Diagnosis not present

## 2012-09-14 DIAGNOSIS — M25569 Pain in unspecified knee: Secondary | ICD-10-CM | POA: Diagnosis not present

## 2012-09-19 ENCOUNTER — Ambulatory Visit (INDEPENDENT_AMBULATORY_CARE_PROVIDER_SITE_OTHER): Payer: Medicare Other | Admitting: Internal Medicine

## 2012-09-19 ENCOUNTER — Encounter: Payer: Self-pay | Admitting: Internal Medicine

## 2012-09-19 VITALS — BP 112/80 | HR 76 | Temp 97.9°F | Ht 70.25 in | Wt 271.0 lb

## 2012-09-19 DIAGNOSIS — M810 Age-related osteoporosis without current pathological fracture: Secondary | ICD-10-CM

## 2012-09-19 DIAGNOSIS — E291 Testicular hypofunction: Secondary | ICD-10-CM | POA: Diagnosis not present

## 2012-09-19 DIAGNOSIS — Z0000 Encounter for general adult medical examination without abnormal findings: Secondary | ICD-10-CM

## 2012-09-19 DIAGNOSIS — Z13820 Encounter for screening for osteoporosis: Secondary | ICD-10-CM

## 2012-09-19 DIAGNOSIS — E119 Type 2 diabetes mellitus without complications: Secondary | ICD-10-CM | POA: Diagnosis not present

## 2012-09-19 DIAGNOSIS — I10 Essential (primary) hypertension: Secondary | ICD-10-CM | POA: Diagnosis not present

## 2012-09-19 DIAGNOSIS — E785 Hyperlipidemia, unspecified: Secondary | ICD-10-CM | POA: Diagnosis not present

## 2012-09-19 DIAGNOSIS — M1990 Unspecified osteoarthritis, unspecified site: Secondary | ICD-10-CM | POA: Diagnosis not present

## 2012-09-19 LAB — LIPID PANEL
Cholesterol: 208 mg/dL — ABNORMAL HIGH (ref 0–200)
HDL: 40.2 mg/dL (ref >39.00)
VLDL: 19.2 mg/dL (ref 0.0–40.0)

## 2012-09-19 LAB — CBC WITH DIFFERENTIAL/PLATELET
Basophils Relative: 0.8 % (ref 0.0–3.0)
Eosinophils Relative: 4.5 % (ref 0.0–5.0)
Lymphocytes Relative: 23.3 % (ref 12.0–46.0)
Monocytes Relative: 8.5 % (ref 3.0–12.0)
Neutrophils Relative %: 62.9 % (ref 43.0–77.0)
Platelets: 587 10*3/uL — ABNORMAL HIGH (ref 150.0–400.0)
RBC: 5.21 Mil/uL (ref 4.22–5.81)
WBC: 9.2 10*3/uL (ref 4.5–10.5)

## 2012-09-19 LAB — HEMOGLOBIN A1C: Hgb A1c MFr Bld: 6.5 % (ref 4.6–6.5)

## 2012-09-19 LAB — TSH: TSH: 0.69 u[IU]/mL (ref 0.35–5.50)

## 2012-09-19 LAB — AST: AST: 15 U/L (ref 0–37)

## 2012-09-19 MED ORDER — ZOSTER VACCINE LIVE 19400 UNT/0.65ML ~~LOC~~ SOLR: 0.6500 mL | Freq: Once | SUBCUTANEOUS | Status: DC

## 2012-09-19 NOTE — Assessment & Plan Note (Signed)
Based on the last cholesterol panel, he is on Pravachol. Labs

## 2012-09-19 NOTE — Assessment & Plan Note (Addendum)
Not on HRT, see previous entry. Check a bone density test to rule out age related to osteoporosis and osteoporosis related to hypogonadism

## 2012-09-19 NOTE — Patient Instructions (Addendum)
You are getting samples of  Januvia 100 mg, take half tablet daily. We will see if you need a higher dose of januvia base on labs. Ibuprofen needs to taken with food in the stomach: if you have nausea, stomach pain or change in the color of his stools stop ibuprofen and let me know. Please come back in 4 months for a checkup.   Fall Prevention and Home Safety Falls cause injuries and can affect all age groups. It is possible to use preventive measures to significantly decrease the likelihood of falls. There are many simple measures which can make your home safer and prevent falls. OUTDOORS  Repair cracks and edges of walkways and driveways.  Remove high doorway thresholds.  Trim shrubbery on the main path into your home.  Have good outside lighting.  Clear walkways of tools, rocks, debris, and clutter.  Check that handrails are not broken and are securely fastened. Both sides of steps should have handrails.  Have leaves, snow, and ice cleared regularly.  Use sand or salt on walkways during winter months.  In the garage, clean up grease or oil spills. BATHROOM  Install night lights.  Install grab bars by the toilet and in the tub and shower.  Use non-skid mats or decals in the tub or shower.  Place a plastic non-slip stool in the shower to sit on, if needed.  Keep floors dry and clean up all water on the floor immediately.  Remove soap buildup in the tub or shower on a regular basis.  Secure bath mats with non-slip, double-sided rug tape.  Remove throw rugs and tripping hazards from the floors. BEDROOMS  Install night lights.  Make sure a bedside light is easy to reach.  Do not use oversized bedding.  Keep a telephone by your bedside.  Have a firm chair with side arms to use for getting dressed.  Remove throw rugs and tripping hazards from the floor. KITCHEN  Keep handles on pots and pans turned toward the center of the stove. Use back burners when  possible.  Clean up spills quickly and allow time for drying.  Avoid walking on wet floors.  Avoid hot utensils and knives.  Position shelves so they are not too high or low.  Place commonly used objects within easy reach.  If necessary, use a sturdy step stool with a grab bar when reaching.  Keep electrical cables out of the way.  Do not use floor polish or wax that makes floors slippery. If you must use wax, use non-skid floor wax.  Remove throw rugs and tripping hazards from the floor. STAIRWAYS  Never leave objects on stairs.  Place handrails on both sides of stairways and use them. Fix any loose handrails. Make sure handrails on both sides of the stairways are as long as the stairs.  Check carpeting to make sure it is firmly attached along stairs. Make repairs to worn or loose carpet promptly.  Avoid placing throw rugs at the top or bottom of stairways, or properly secure the rug with carpet tape to prevent slippage. Get rid of throw rugs, if possible.  Have an electrician put in a light switch at the top and bottom of the stairs. OTHER FALL PREVENTION TIPS  Wear low-heel or rubber-soled shoes that are supportive and fit well. Wear closed toe shoes.  When using a stepladder, make sure it is fully opened and both spreaders are firmly locked. Do not climb a closed stepladder.  Add color or contrast  paint or tape to grab bars and handrails in your home. Place contrasting color strips on first and last steps.  Learn and use mobility aids as needed. Install an electrical emergency response system.  Turn on lights to avoid dark areas. Replace light bulbs that burn out immediately. Get light switches that glow.  Arrange furniture to create clear pathways. Keep furniture in the same place.  Firmly attach carpet with non-skid or double-sided tape.  Eliminate uneven floor surfaces.  Select a carpet pattern that does not visually hide the edge of steps.  Be aware of all  pets. OTHER HOME SAFETY TIPS  Set the water temperature for 120 F (48.8 C).  Keep emergency numbers on or near the telephone.  Keep smoke detectors on every level of the home and near sleeping areas. Document Released: 07/23/2002 Document Revised: 02/01/2012 Document Reviewed: 10/22/2011 [facility] Patient Information 2013 [place], [place].

## 2012-09-19 NOTE — Progress Notes (Signed)
Subjective:    Patient ID: William Stokes, male    DOB: May 25, 1946, 67 y.o.   MRN: [id number]  HPI Here for Medicare AWV: 1. Risk factors based on Past M, S, F history: reviewed 2. Physical Activities: doing PT for knee pain, active at home , no yard work  3. Depression/mood: (-) screening   4. Hearing: No depression or anxiety 5. ADL's: independents, drives    6. Fall Risk: no recent falls, see instructions  7. home Safety: does feelsafe at home  8. Height, weight, &visual acuity: see VS, normal vision w/ glasses , sees eye doctor 9. Counseling: provided 10. Labs ordered based on risk factors: if needed  11. Referral Coordination: if needed 12.  Care Plan, see assessment and plan  13.   Cognitive Assessment: Cognition seems intact, motor skills only limited by DJD  In addition, today we discussed the following: DJD, still having knee pain, currently not requiring hydrocodone just ibuprofen 800 mg twice a day. Doing physical therapy. High cholesterol, good medication compliance. Diabetes, good medication compliance with Januvia, cost may become an issue, ambulatory blood sugars always less than 117. Hypertension, excellent ambulatory BPs per patient. Past Medical History  Diagnosis Date  . Osteoarthritis     left knee  . Hyperlipidemia     was on Crestor but has been off for a couple of months  . Glaucoma(365)   . Primary male hypogonadism   . Adenomatous colon polyp   . LAFB (left anterior fascicular block)     per baseline EKG 2009  . Anxiety   . PTSD (post-traumatic stress disorder)     went to [name], has chronic diff. sleeping  . Erectile dysfunction   . Hypertension 3/11    takes Lisinopril daily  . Sleep apnea, obstructive     uses CPAP and last sleep study in epic from 2011  . Eczema     legs and hands  . GERD (gastroesophageal reflux disease)     takes OMeprazole daily  . Diabetes mellitus 2/09    takes Januvia-started 2wks ago  . Cataracts, bilateral    immature  . Insomnia   . History of MRSA infection     6-67yrs ago    Past Surgical History  Procedure Date  . Knee arthroscopy 1999    meniscal torn L, Dr.Dalldorf  . Colonoscopy   . Quadriceps tendon [place]    left leg  . Hernia repair   . Total knee arthroplasty 03/28/2012    LEFT TOTAL KNEE ARTHROPLASTY;  Surgeon: Velna Ochs, MD;  Location: [facility] OR;  Service: Orthopedics;  Laterality: Left;  with revision tibia  . Total knee arthroplasty 08/03/2012    Procedure: TOTAL KNEE ARTHROPLASTY;  Surgeon: Velna Ochs, MD;  Location: [facility] OR;  Service: Orthopedics;  Laterality: Right;   History   Social History  . Marital Status: Married    Spouse Name: N/A    Number of Children: 2  . Years of Education: N/A   Occupational History  . Retired from [employer]   . RETIRED   .     Social History Main Topics  . Smoking status: Former Smoker -- 2.0 packs/day for 22 years    Types: Cigarettes    Quit date: 08/17/1991  . Smokeless tobacco: Never Used  . Alcohol Use: No  . Drug Use: No  . Sexually Active: Yes   Other Topics Concern  . Not on file   Social History Narrative  Born in [place], Parents from [place] years   Family History  Problem Relation Age of Onset  . Heart disease Mother     MI age onset 54s?  . Prostate cancer Father     dx age 49s  . Colon cancer Neg Hx   . Diabetes Mother     Review of Systems Denies chest pain or shortness of breath No nausea, vomiting, diarrhea or blood in the stools. No dysuria or gross hematuria.     Objective:   Physical Exam General -- alert, well-developed, NAD.   Neck --no thyromegaly   Lungs -- normal respiratory effort, no intercostal retractions, no accessory muscle use, and normal breath sounds.   Heart-- normal rate, regular rhythm, no murmur, and no gallop.   Abdomen--soft, non-tender, no distention, no masses, no HSM, no guarding, and no rigidity.   Extremities-- no pretibial edema  bilaterally Neurologic-- alert & oriented X3 and strength normal in all extremities. Psych-- Cognition and judgment appear intact. Alert and cooperative with normal attention span and concentration.  not anxious appearing and not depressed appearing.       Assessment & Plan:

## 2012-09-19 NOTE — Assessment & Plan Note (Signed)
Still having knee pain, well-controlled with ibuprofen. GI side effects discussed

## 2012-09-19 NOTE — Assessment & Plan Note (Signed)
Based on last A1c, he started Januvia   50 mg. Cost may be an issue with Januvia. Plan: A1c, Januvia 100 mg samples, half tablet

## 2012-09-19 NOTE — Assessment & Plan Note (Addendum)
Td 2010  Pneumonia shot 2010 Had a flu sshot zostavax-- discussed, rx provided  Diet and exercise discussed Labs including a PSA Colonoscopy:   [facility].  (02/28/2008) , tubular adenomas Colonoscopy 03-2011, Dr. Marina Goodell, 3 polyps, next in 3-5 years, per GI PSAs have been consistently normal. Reassess next year

## 2012-09-21 DIAGNOSIS — M25569 Pain in unspecified knee: Secondary | ICD-10-CM | POA: Diagnosis not present

## 2012-09-21 DIAGNOSIS — Z96659 Presence of unspecified artificial knee joint: Secondary | ICD-10-CM | POA: Diagnosis not present

## 2012-09-25 ENCOUNTER — Encounter: Payer: Self-pay | Admitting: *Deleted

## 2012-09-26 DIAGNOSIS — M25569 Pain in unspecified knee: Secondary | ICD-10-CM | POA: Diagnosis not present

## 2012-09-26 DIAGNOSIS — Z96659 Presence of unspecified artificial knee joint: Secondary | ICD-10-CM | POA: Diagnosis not present

## 2012-09-29 ENCOUNTER — Inpatient Hospital Stay: Admission: RE | Admit: 2012-09-29 | Source: Ambulatory Visit

## 2012-10-03 DIAGNOSIS — Z96659 Presence of unspecified artificial knee joint: Secondary | ICD-10-CM | POA: Diagnosis not present

## 2012-10-03 DIAGNOSIS — M25569 Pain in unspecified knee: Secondary | ICD-10-CM | POA: Diagnosis not present

## 2012-10-04 ENCOUNTER — Ambulatory Visit (INDEPENDENT_AMBULATORY_CARE_PROVIDER_SITE_OTHER)
Admission: RE | Admit: 2012-10-04 | Discharge: 2012-10-04 | Disposition: A | Discharge status: Home / Self Care | Payer: Medicare Other | Source: Ambulatory Visit | Attending: Internal Medicine | Admitting: Internal Medicine

## 2012-10-04 DIAGNOSIS — E291 Testicular hypofunction: Secondary | ICD-10-CM | POA: Diagnosis not present

## 2012-10-04 DIAGNOSIS — M810 Age-related osteoporosis without current pathological fracture: Secondary | ICD-10-CM | POA: Diagnosis not present

## 2012-10-04 DIAGNOSIS — M1990 Unspecified osteoarthritis, unspecified site: Secondary | ICD-10-CM | POA: Diagnosis not present

## 2012-10-05 DIAGNOSIS — M25569 Pain in unspecified knee: Secondary | ICD-10-CM | POA: Diagnosis not present

## 2012-10-05 DIAGNOSIS — Z96659 Presence of unspecified artificial knee joint: Secondary | ICD-10-CM | POA: Diagnosis not present

## 2012-10-10 ENCOUNTER — Encounter: Payer: Self-pay | Admitting: *Deleted

## 2012-11-06 DIAGNOSIS — M25569 Pain in unspecified knee: Secondary | ICD-10-CM | POA: Diagnosis not present

## 2012-12-18 ENCOUNTER — Telehealth: Payer: Self-pay | Admitting: *Deleted

## 2012-12-18 NOTE — Telephone Encounter (Signed)
Ok #28 if available

## 2012-12-18 NOTE — Telephone Encounter (Signed)
Pt  requesting samples of Januvia. Please advise

## 2012-12-18 NOTE — Telephone Encounter (Signed)
Left detailed msg making pt aware samples are ready to be picked up at front desk.

## 2013-01-03 ENCOUNTER — Ambulatory Visit (INDEPENDENT_AMBULATORY_CARE_PROVIDER_SITE_OTHER): Payer: Medicare Other | Admitting: Pulmonary Disease

## 2013-01-03 ENCOUNTER — Encounter: Payer: Self-pay | Admitting: Pulmonary Disease

## 2013-01-03 ENCOUNTER — Other Ambulatory Visit: Payer: Self-pay | Admitting: General Practice

## 2013-01-03 VITALS — BP 126/96 | HR 80 | Temp 98.2°F | Ht 71.0 in | Wt 281.4 lb

## 2013-01-03 DIAGNOSIS — G4733 Obstructive sleep apnea (adult) (pediatric): Secondary | ICD-10-CM

## 2013-01-03 MED ORDER — PRAVASTATIN SODIUM 20 MG PO TABS: 20.0000 mg | ORAL_TABLET | Freq: Every day | ORAL | Status: DC

## 2013-01-03 NOTE — Assessment & Plan Note (Signed)
The patient continues to be noncompliant with CPAP, but there is no particular issue with the device or mask.  It is simply a decision that he has made.  I have asked him to try and either commit to wearing the device every night, or stop using the device and work aggressively on weight loss.  The patient will let me know his decision.

## 2013-01-03 NOTE — Progress Notes (Signed)
  Subjective:    Patient ID: William Stokes, male    DOB: 1946/02/19, 67 y.o.   MRN: [id number]  HPI The patient comes in today for followup of his obstructive sleep apnea.  He continues to be noncompliant with his CPAP device, and this only when her it maybe 2 nights a week.  When he does wear the device, he feels that he sleeps well with excellent daytime alertness.  His wife states he comes to bed late and usually does not bother putting the device on.  The patient does not feel there is any specific thing about the CPAP that keeps him from wearing it   Review of Systems  Constitutional: Negative for fever and unexpected weight change.  HENT: Negative for ear pain, nosebleeds, congestion, sore throat, rhinorrhea, sneezing, trouble swallowing, dental problem, postnasal drip and sinus pressure.   Eyes: Negative for redness and itching.  Respiratory: Positive for shortness of breath ( patient wife reports this is new). Negative for cough, chest tightness and wheezing.   Cardiovascular: Negative for palpitations and leg swelling.  Gastrointestinal: Negative for nausea and vomiting.  Genitourinary: Negative for dysuria.  Musculoskeletal: Negative for joint swelling.  Skin: Negative for rash.  Neurological: Negative for headaches.  Hematological: Does not bruise/bleed easily.  Psychiatric/Behavioral: Negative for dysphoric mood. The patient is not nervous/anxious.        Objective:   Physical Exam Obese male in no acute distress Nose with purulent discharge noted No skin breakdown or pressure necrosis from the CPAP mask Neck without lymphadenopathy or thyromegaly Lower extremities without significant edema, no cyanosis Alert, does not appear to be sleepy, moves all 4 extremities.       Assessment & Plan:

## 2013-01-03 NOTE — Patient Instructions (Addendum)
Make a decision to commit to cpap or not.  Let me know if you decide to stop using. If you stick with cpap, keep up with mask changes and supplies. Work on weight loss followup with me in one year if doing well.

## 2013-01-09 ENCOUNTER — Telehealth: Payer: Self-pay | Admitting: General Practice

## 2013-01-09 MED ORDER — PRAVASTATIN SODIUM 40 MG PO TABS: 40.0000 mg | ORAL_TABLET | Freq: Every day | ORAL | Status: DC

## 2013-01-09 NOTE — Telephone Encounter (Signed)
His sodium Pravachol 40 mg 1 po qhs . Okay to write a prescription for  #90 and 1 RF

## 2013-01-09 NOTE — Telephone Encounter (Signed)
Pt called stating that he needs a hard copy of his pravastatin, so that he can mail it to the [place]. Pt last seen on 01-03-13 and med listed as historical Please advise if ok to fill?   Please call pt when this is completed.

## 2013-01-09 NOTE — Telephone Encounter (Signed)
Refill done.  

## 2013-01-17 ENCOUNTER — Ambulatory Visit (INDEPENDENT_AMBULATORY_CARE_PROVIDER_SITE_OTHER): Payer: Medicare Other | Admitting: Internal Medicine

## 2013-01-17 ENCOUNTER — Encounter: Payer: Self-pay | Admitting: Internal Medicine

## 2013-01-17 VITALS — BP 120/74 | HR 74 | Temp 98.1°F | Wt 275.0 lb

## 2013-01-17 DIAGNOSIS — I10 Essential (primary) hypertension: Secondary | ICD-10-CM | POA: Diagnosis not present

## 2013-01-17 DIAGNOSIS — E119 Type 2 diabetes mellitus without complications: Secondary | ICD-10-CM

## 2013-01-17 DIAGNOSIS — R21 Rash and other nonspecific skin eruption: Secondary | ICD-10-CM

## 2013-01-17 DIAGNOSIS — E785 Hyperlipidemia, unspecified: Secondary | ICD-10-CM

## 2013-01-17 DIAGNOSIS — G4733 Obstructive sleep apnea (adult) (pediatric): Secondary | ICD-10-CM

## 2013-01-17 DIAGNOSIS — E291 Testicular hypofunction: Secondary | ICD-10-CM | POA: Diagnosis not present

## 2013-01-17 LAB — LIPID PANEL
Cholesterol: 154 mg/dL (ref 0–200)
HDL: 36 mg/dL — ABNORMAL LOW (ref >39.00)
LDL Cholesterol: 105 mg/dL — ABNORMAL HIGH (ref 0–99)
Total CHOL/HDL Ratio: 4
Triglycerides: 67 mg/dL (ref 0.0–149.0)

## 2013-01-17 LAB — BASIC METABOLIC PANEL
BUN: 19 mg/dL (ref 6–23)
CO2: 32 mEq/L (ref 19–32)
Chloride: 101 mEq/L (ref 96–112)
Creatinine, Ser: 0.9 mg/dL (ref 0.4–1.5)
Potassium: 4.2 mEq/L (ref 3.5–5.1)

## 2013-01-17 LAB — AST: AST: 20 U/L (ref 0–37)

## 2013-01-17 LAB — ALT: ALT: 18 U/L (ref 0–53)

## 2013-01-17 MED ORDER — FREESTYLE LANCETS MISC: Status: DC

## 2013-01-17 MED ORDER — GLUCOSE BLOOD VI STRP: ORAL_STRIP | Status: DC

## 2013-01-17 MED ORDER — HYDROCORTISONE 2.5 % EX CREA: TOPICAL_CREAM | Freq: Two times a day (BID) | CUTANEOUS | Status: DC

## 2013-01-17 NOTE — Assessment & Plan Note (Addendum)
Good med compliance , labs. We talk about increase  his exercise. No neuropathy on exam, he does have tinea pedis, strongly encouraged to use OTCs let me know if not improving.

## 2013-01-17 NOTE — Assessment & Plan Note (Signed)
Based on the last cholesterol panel, we ncreased Pravachol from 20 mg daily to 40 mg daily. Labs

## 2013-01-17 NOTE — Patient Instructions (Addendum)
Next visit 5 months Use OTC antifungals for the feet infection  Diabetes and Foot Care Diabetes may cause you to have a poor blood supply (circulation) to your legs and feet. Because of this, the skin may be thinner, break easier, and heal more slowly. You also may have nerve damage in your legs and feet causing decreased feeling. You may not notice minor injuries to your feet that could lead to serious problems or infections. Taking care of your feet is one of the most important things you can do for yourself.  HOME CARE INSTRUCTIONS  Do not go barefoot. Bare feet are easily injured.  Check your feet daily for blisters, cuts, and redness.  Wash your feet with warm water (not hot) and mild soap. Pat your feet and between your toes until completely dry.  Apply a moisturizing lotion that does not contain alcohol or petroleum jelly to the dry skin on your feet and to dry brittle toenails. Do not put it between your toes.  Trim your toenails straight across. Do not dig under them or around the cuticle.  Do not cut corns or calluses, or try to remove them with medicine.  Wear clean cotton socks or stockings every day. Make sure they are not too tight. Do not wear knee high stockings since they may decrease blood flow to your legs.  Wear leather shoes that fit properly and have enough cushioning. To break in new shoes, wear them just a few hours a day to avoid injuring your feet.  Wear shoes at all times, even in the house.  Do not cross your legs. This may decrease the blood flow to your feet.  If you find a minor scrape, cut, or break in the skin on your feet, keep it and the skin around it clean and dry. These areas may be cleansed with mild soap and water. Do not use peroxide, alcohol, iodine or Merthiolate.  When you remove an adhesive bandage, be sure not to harm the skin around it.  If you have a wound, look at it several times a day to make sure it is healing.  Do not use heating  pads or hot water bottles. Burns can occur. If you have lost feeling in your feet or legs, you may not know it is happening until it is too late.  Report any cuts, sores or bruises to your caregiver. Do not wait! SEEK MEDICAL CARE IF:   You have an injury that is not healing or you notice redness, numbness, burning, or tingling.  Your feet always feel cold.  You have pain or cramps in your legs and feet. SEEK IMMEDIATE MEDICAL CARE IF:   There is increasing redness, swelling, or increasing pain in the wound.  There is a red line that goes up your leg.  Pus is coming from a wound.  You develop an unexplained oral temperature above 102 F (38.9 C), or as your caregiver suggests.  You notice a bad smell coming from an ulcer or wound. MAKE SURE YOU:   Understand these instructions.  Will watch your condition.  Will get help right away if you are not doing well or get worse. Document Released: 07/30/2000 Document Revised: 10/25/2011 Document Reviewed: 02/05/2009 [facility] Patient Information 2014 [place], [place].

## 2013-01-17 NOTE — Assessment & Plan Note (Signed)
Much better compliance with CPAP

## 2013-01-17 NOTE — Assessment & Plan Note (Signed)
Recent bone density test essentially negative

## 2013-01-17 NOTE — Progress Notes (Signed)
  Subjective:    Patient ID: William Stokes, male    DOB: 17-Oct-1945, 67 y.o.   MRN: [id number]  HPI Routine office visit High cholesterol, based on the last cholesterol panel he increase Pravachol from 20 mg to 40 mg Diabetes, good medication compliance, CBGs always less than 126. 3 weeks ago developed a rash in the palmar aspect of the hands, no itching, feet are not affected, they use a steroid lotion and looks much better. Does not recall any specific contact such as using gloves Sleep apnea, he is using CPAP consistently.  Past Medical History  Diagnosis Date  . Osteoarthritis     left knee  . Hyperlipidemia     was on Crestor but has been off for a couple of months  . Glaucoma   . Primary male hypogonadism   . Adenomatous colon polyp   . LAFB (left anterior fascicular block)     per baseline EKG 2009  . Anxiety   . PTSD (post-traumatic stress disorder)     went to [name], has chronic diff. sleeping  . Erectile dysfunction   . Hypertension 3/11    takes Lisinopril daily  . Sleep apnea, obstructive     uses CPAP and last sleep study in epic from 2011  . Eczema     legs and hands  . GERD (gastroesophageal reflux disease)     takes OMeprazole daily  . Diabetes mellitus 2/09    takes Januvia-started 2wks ago  . Cataracts, bilateral     immature  . Insomnia   . History of MRSA infection     6-16yrs ago   Past Surgical History  Procedure Laterality Date  . Knee arthroscopy  1999    meniscal torn L, Dr.Dalldorf  . Colonoscopy    . Quadriceps tendon repair  while [place]    left leg  . Hernia repair    . Total knee arthroplasty  03/28/2012    LEFT TOTAL KNEE ARTHROPLASTY;  Surgeon: Velna Ochs, MD;  Location: [facility] OR;  Service: Orthopedics;  Laterality: Left;  with revision tibia  . Total knee arthroplasty  08/03/2012    Procedure: TOTAL KNEE ARTHROPLASTY;  Surgeon: Velna Ochs, MD;  Location: [facility] OR;  Service: Orthopedics;  Laterality: Right;    Review of  Systems No chest pain, stamina has increased a little since he started to use the CPAP regulalrly. no nausea, vomiting, diarrhea. Trying to stay active however has gained some weight  Denies any feet paresthesias.     Objective:   Physical Exam BP 120/74  Pulse 74  Temp(Src) 98.1 F (36.7 C) (Oral)  Wt 275 lb (124.739 kg)  BMI 38.37 kg/m2  SpO2 95%  General -- alert, well-developed,NAD  Lungs -- normal respiratory effort, no intercostal retractions, no accessory muscle use, and normal breath sounds.   Heart-- normal rate, regular rhythm, no murmur, and no gallop.   Hand-- dry skin, some scaliness, has several dry blisters, only the palmar aspect of both hands is affected. DIABETIC FEET EXAM: No lower extremity edema Normal pedal pulses bilaterally Skin -- tinea pedis noted,   nails dystrophic Pinprick examination of the feet normal. Neurologic-- alert & oriented X3 and strength normal in all extremities. Psych-- Cognition and judgment appear intact. Alert and cooperative with normal attention span and concentration.  not anxious appearing and not depressed appearing.       Assessment & Plan:  Hand rash-- new problem, improving w/ steroids, Rx hydricortisone

## 2013-01-17 NOTE — Assessment & Plan Note (Signed)
Good compliance with medication, BP today normal, check labs

## 2013-01-22 ENCOUNTER — Encounter: Payer: Self-pay | Admitting: *Deleted

## 2013-02-05 DIAGNOSIS — M25569 Pain in unspecified knee: Secondary | ICD-10-CM | POA: Diagnosis not present

## 2013-03-09 ENCOUNTER — Telehealth: Payer: Self-pay | Admitting: Internal Medicine

## 2013-03-09 NOTE — Telephone Encounter (Signed)
Patients wife is requesting samples of januvia for patient.

## 2013-03-09 NOTE — Telephone Encounter (Signed)
Ok to give samples to patient per Dr. Drue Novel. Only 100 mg tablets available. Pt. Wife states this is the dose they normally receive from our office and break the tablets in half. Cleared with Dr. Drue Novel prior to authorizing samples. Clarified with wife need to continue breaking tablets in half. Pt. Wife made aware samples at front desk per DPR.

## 2013-04-09 ENCOUNTER — Telehealth: Payer: Self-pay | Admitting: General Practice

## 2013-04-09 MED ORDER — SITAGLIPTIN PHOSPHATE 50 MG PO TABS: 50.0000 mg | ORAL_TABLET | Freq: Every day | ORAL | Status: DC

## 2013-04-09 NOTE — Telephone Encounter (Signed)
Januvia Samples given to pt.   #30 with 1 refill Lot: [id number] EXP: 03/2015

## 2013-06-23 IMAGING — CR DG CHEST 2V
2 series · 2 of 2 positions shown · non-contrast
Comparison: 12/09/2010

CLINICAL DATA: Preop left total knee arthroplasty

CHEST - 2 VIEW

[view not recorded (1 of 2)]
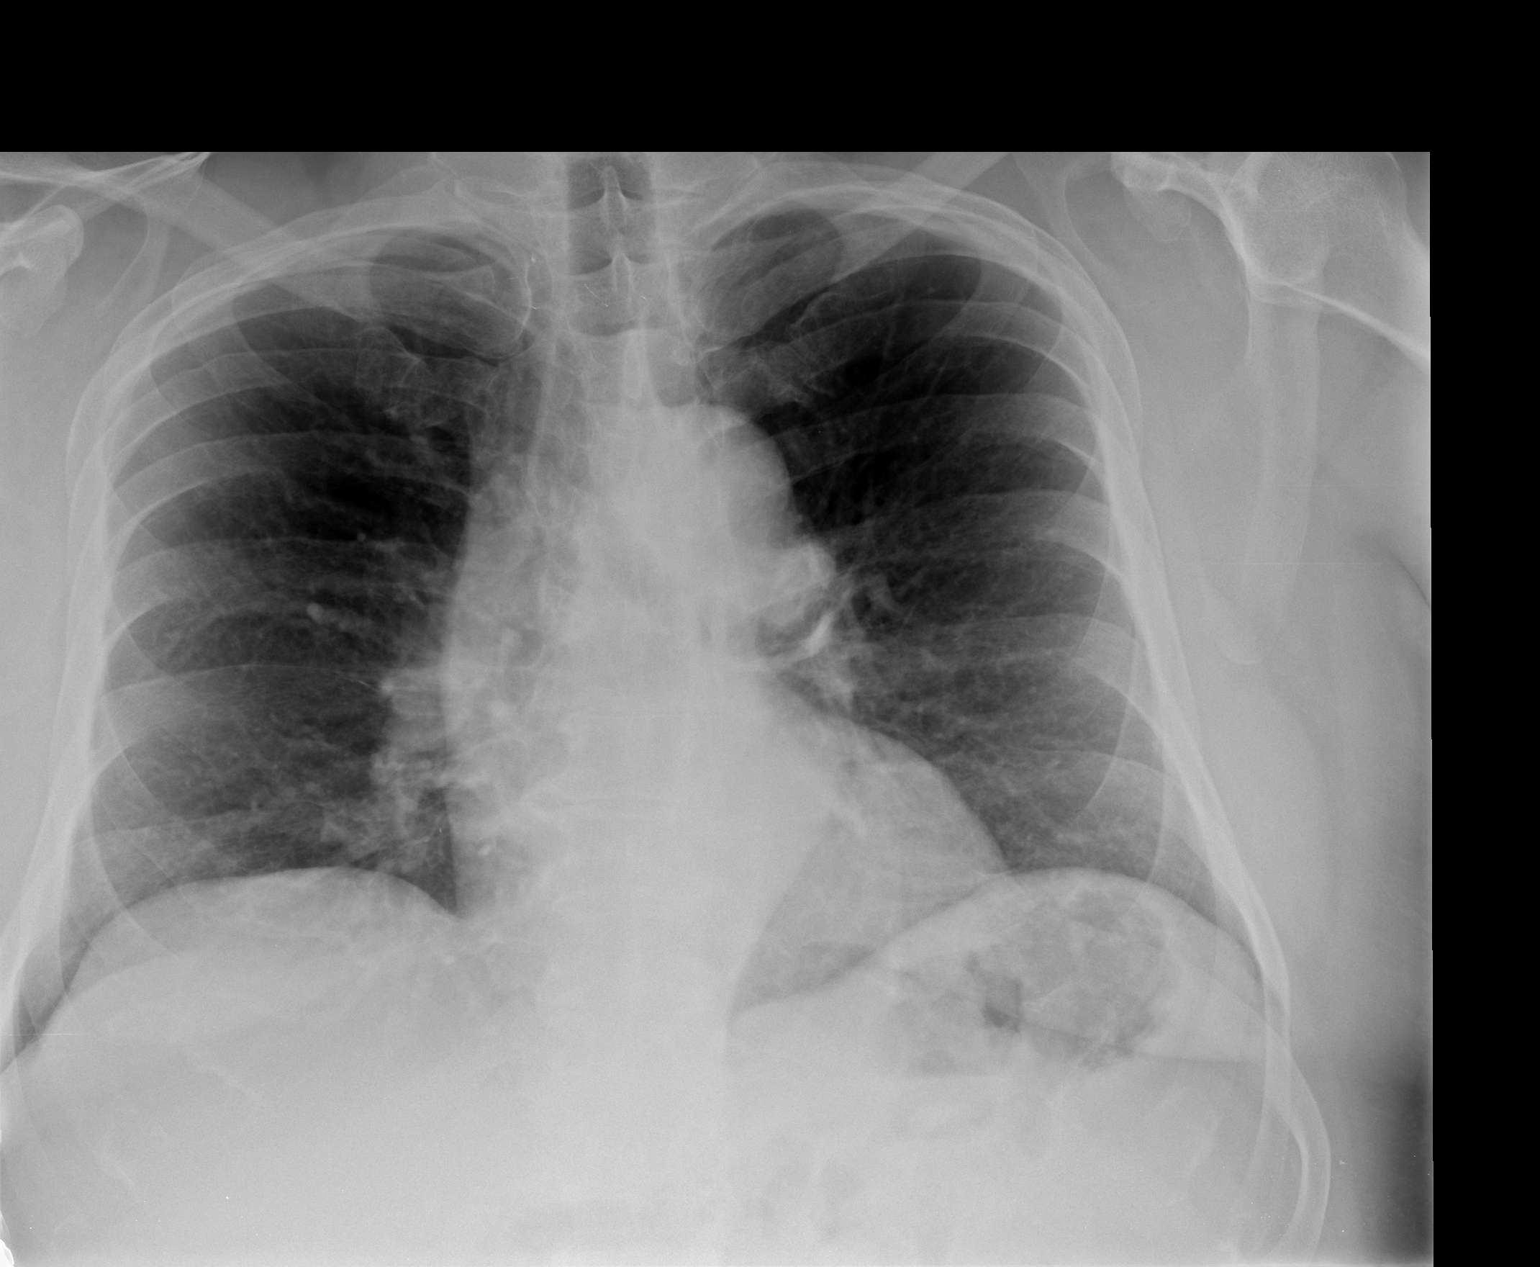

[view not recorded (2 of 2)]
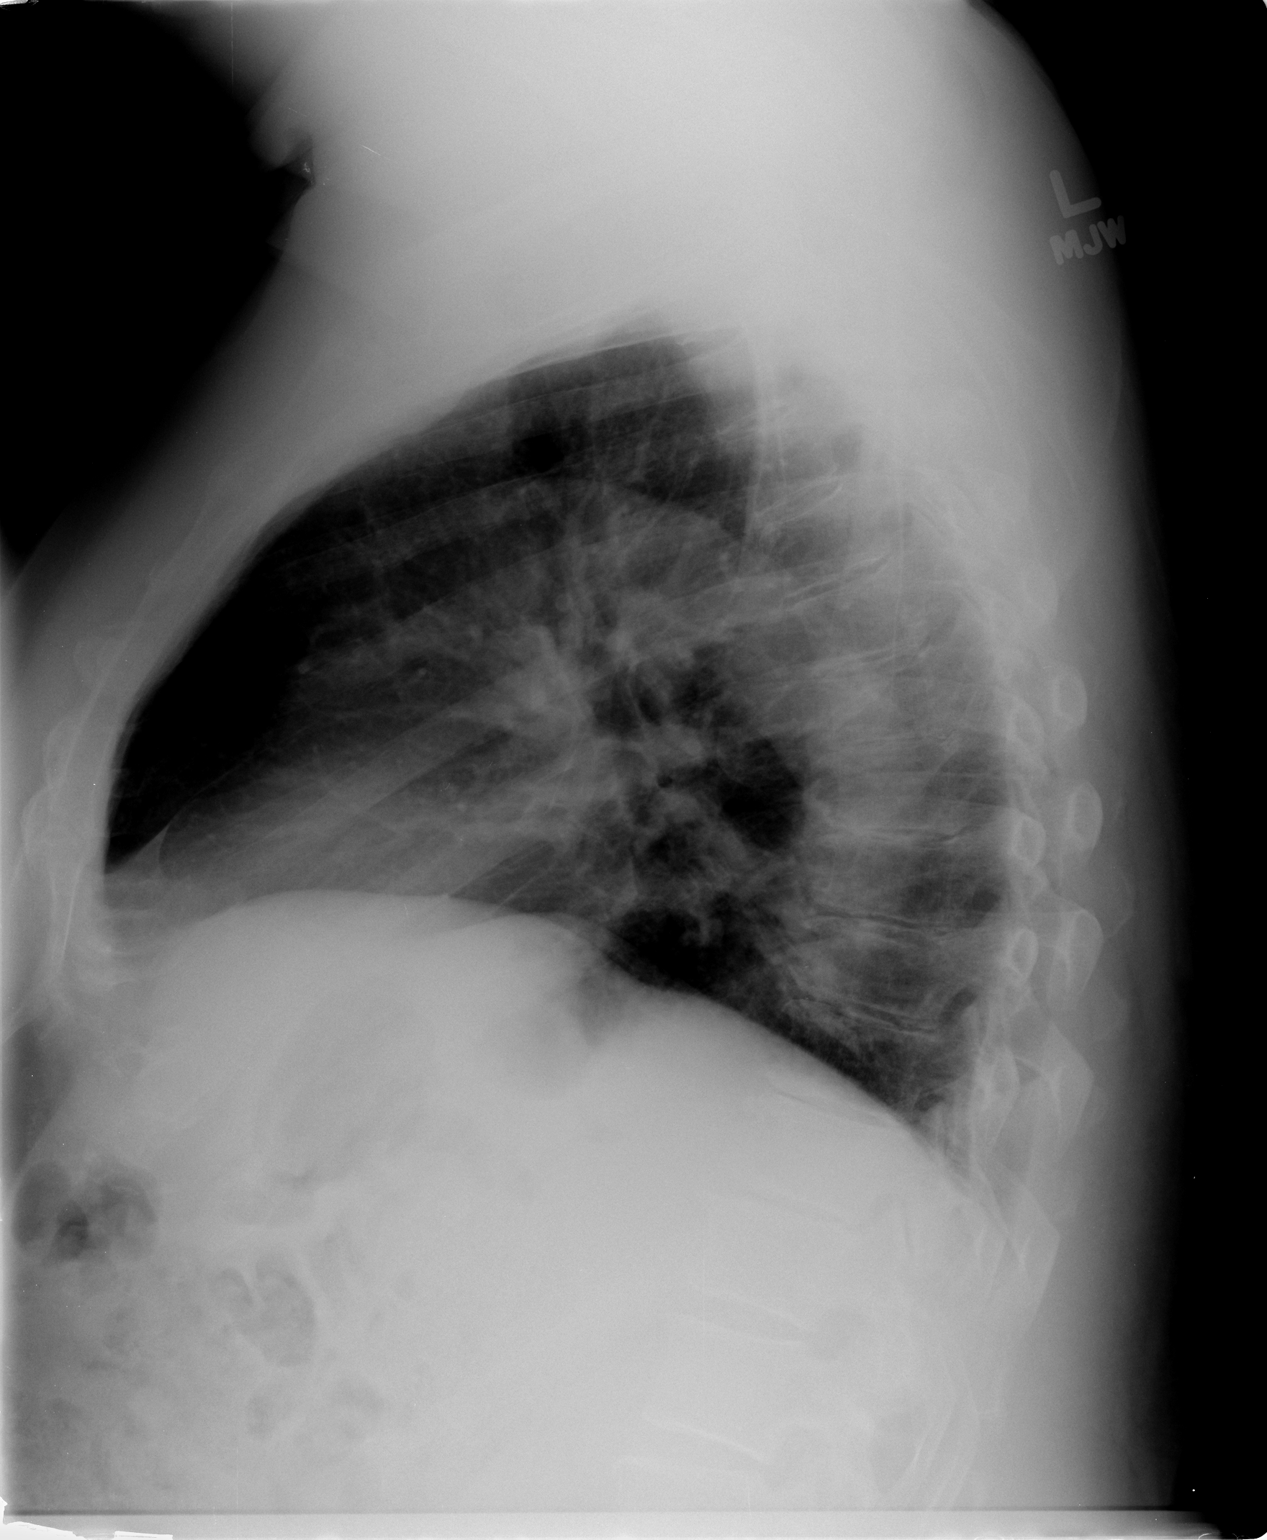

[2 of 2 positions shown; findings below may reference images not displayed]

FINDINGS: Lungs are clear. No pleural effusion or pneumothorax.

Cardiomediastinal silhouette is within normal limits.

Degenerative changes of the visualized thoracolumbar spine.
IMPRESSION: No evidence of acute cardiopulmonary disease.

## 2013-06-27 ENCOUNTER — Ambulatory Visit (INDEPENDENT_AMBULATORY_CARE_PROVIDER_SITE_OTHER): Payer: Medicare Other | Admitting: Internal Medicine

## 2013-06-27 ENCOUNTER — Other Ambulatory Visit: Payer: Self-pay | Admitting: *Deleted

## 2013-06-27 ENCOUNTER — Encounter: Payer: Self-pay | Admitting: Internal Medicine

## 2013-06-27 VITALS — BP 129/84 | HR 80 | Temp 98.4°F | Wt 273.0 lb

## 2013-06-27 DIAGNOSIS — K219 Gastro-esophageal reflux disease without esophagitis: Secondary | ICD-10-CM

## 2013-06-27 DIAGNOSIS — Z23 Encounter for immunization: Secondary | ICD-10-CM

## 2013-06-27 DIAGNOSIS — E785 Hyperlipidemia, unspecified: Secondary | ICD-10-CM

## 2013-06-27 DIAGNOSIS — E119 Type 2 diabetes mellitus without complications: Secondary | ICD-10-CM | POA: Diagnosis not present

## 2013-06-27 DIAGNOSIS — I10 Essential (primary) hypertension: Secondary | ICD-10-CM

## 2013-06-27 MED ORDER — LISINOPRIL 20 MG PO TABS: 10.0000 mg | ORAL_TABLET | Freq: Every day | ORAL | Status: DC

## 2013-06-27 MED ORDER — OMEPRAZOLE 40 MG PO CPDR: 40.0000 mg | DELAYED_RELEASE_CAPSULE | Freq: Every day | ORAL | Status: DC

## 2013-06-27 MED ORDER — PRAVASTATIN SODIUM 20 MG PO TABS: 40.0000 mg | ORAL_TABLET | Freq: Every day | ORAL | Status: DC

## 2013-06-27 NOTE — Progress Notes (Signed)
Pre visit review using our clinic review tool, if applicable. No additional management support is needed unless otherwise documented below in the visit note. 

## 2013-06-27 NOTE — Assessment & Plan Note (Addendum)
Diet, exercise encouraged Check a A1c Had tinea pedis, improving. Samples [place]

## 2013-06-27 NOTE — Patient Instructions (Signed)
Get your blood work before you leave  Next visit in 4 - 5 months  for a physical exam . Fasting Please make an appointment

## 2013-06-27 NOTE — Progress Notes (Signed)
  Subjective:    Patient ID: William Stokes, male    DOB: Sep 02, 1945, 67 y.o.   MRN: [id number]  HPI Routine office visit Diabetes, good compliance with Januvia 50 mg, ambulatory CBGs at 109, 180. Hypertension--good medication compliance OSA--good compliance with CPAP DJD--continue with Knee pain, plans to see ortho soon, takes ibuprofen 200 mg daily without apparent side effects. High cholesterol--needs a refill  Past Medical History  Diagnosis Date  . Osteoarthritis     left knee  . Hyperlipidemia     was on Crestor but has been off for a couple of months  . Glaucoma   . Primary male hypogonadism   . Adenomatous colon polyp   . LAFB (left anterior fascicular block)     per baseline EKG 2009  . Anxiety   . PTSD (post-traumatic stress disorder)     went to [name], has chronic diff. sleeping  . Erectile dysfunction   . Hypertension 3/11    takes Lisinopril daily  . Sleep apnea, obstructive     uses CPAP and last sleep study in epic from 2011  . Eczema     legs and hands  . GERD (gastroesophageal reflux disease)     takes OMeprazole daily  . Diabetes mellitus 2/09    takes Januvia-started 2wks ago  . Cataracts, bilateral     immature  . Insomnia   . History of MRSA infection     6-47yrs ago   Past Surgical History  Procedure Laterality Date  . Knee arthroscopy  1999    meniscal torn L, Dr.Dalldorf  . Colonoscopy    . Quadriceps tendon repair  while in [place]    left leg  . Hernia repair    . Total knee arthroplasty  03/28/2012    LEFT TOTAL KNEE ARTHROPLASTY;  Surgeon: Velna Ochs, MD;  Location: [facility] OR;  Service: Orthopedics;  Laterality: Left;  with revision tibia  . Total knee arthroplasty  08/03/2012    Procedure: TOTAL KNEE ARTHROPLASTY;  Surgeon: Velna Ochs, MD;  Location: [facility] OR;  Service: Orthopedics;  Laterality: Right;     Review of Systems No  CP, SOB, lower extremity edema No orthopnea , DOE Denies  nausea, vomiting diarrhea Denies  blood  in the stools Vision ok w/ glasses, has an appointment pending w/ eye doctor  No LE paresthesias   Had tinea pedis few months ago, improving    Objective:   Physical Exam BP 129/84  Pulse 80  Temp(Src) 98.4 F (36.9 C)  Wt 273 lb (123.832 kg)  SpO2 93% General -- alert, well-developed, NAD.   Lungs -- normal respiratory effort, no intercostal retractions, no accessory muscle use, and normal breath sounds.  Heart-- normal rate, regular rhythm, no murmur.  Abdomen-- Not distended, good bowel sounds,soft, non-tender.  Extremities-- no pretibial edema bilaterally  Neurologic--  alert & oriented X3. Speech normal. Psych-- Cognition and judgment appear intact. Cooperative with normal attention span and concentration. No anxious appearing , no depressed appearing.     Assessment & Plan:

## 2013-06-27 NOTE — Assessment & Plan Note (Signed)
RF meds  

## 2013-06-27 NOTE — Assessment & Plan Note (Signed)
Seems well controlled, last labs ok

## 2013-06-27 NOTE — Assessment & Plan Note (Signed)
RF meds , last FLP satisfactory

## 2013-06-29 ENCOUNTER — Encounter: Payer: Self-pay | Admitting: *Deleted

## 2013-08-12 ENCOUNTER — Emergency Department (HOSPITAL_BASED_OUTPATIENT_CLINIC_OR_DEPARTMENT_OTHER): Payer: Medicare Other

## 2013-08-12 ENCOUNTER — Encounter (HOSPITAL_BASED_OUTPATIENT_CLINIC_OR_DEPARTMENT_OTHER): Payer: Self-pay | Admitting: Emergency Medicine

## 2013-08-12 ENCOUNTER — Emergency Department (HOSPITAL_BASED_OUTPATIENT_CLINIC_OR_DEPARTMENT_OTHER)
Admission: EM | Admit: 2013-08-12 | Discharge: 2013-08-12 | Disposition: A | Discharge status: Home / Self Care | Payer: Medicare Other | Attending: Emergency Medicine | Admitting: Emergency Medicine

## 2013-08-12 DIAGNOSIS — F411 Generalized anxiety disorder: Secondary | ICD-10-CM | POA: Diagnosis not present

## 2013-08-12 DIAGNOSIS — K219 Gastro-esophageal reflux disease without esophagitis: Secondary | ICD-10-CM | POA: Insufficient documentation

## 2013-08-12 DIAGNOSIS — Z791 Long term (current) use of non-steroidal anti-inflammatories (NSAID): Secondary | ICD-10-CM | POA: Diagnosis not present

## 2013-08-12 DIAGNOSIS — R630 Anorexia: Secondary | ICD-10-CM | POA: Insufficient documentation

## 2013-08-12 DIAGNOSIS — Z7982 Long term (current) use of aspirin: Secondary | ICD-10-CM | POA: Insufficient documentation

## 2013-08-12 DIAGNOSIS — E785 Hyperlipidemia, unspecified: Secondary | ICD-10-CM | POA: Diagnosis not present

## 2013-08-12 DIAGNOSIS — Z79899 Other long term (current) drug therapy: Secondary | ICD-10-CM | POA: Insufficient documentation

## 2013-08-12 DIAGNOSIS — Z872 Personal history of diseases of the skin and subcutaneous tissue: Secondary | ICD-10-CM | POA: Diagnosis not present

## 2013-08-12 DIAGNOSIS — J159 Unspecified bacterial pneumonia: Secondary | ICD-10-CM | POA: Diagnosis not present

## 2013-08-12 DIAGNOSIS — IMO0002 Reserved for concepts with insufficient information to code with codable children: Secondary | ICD-10-CM | POA: Insufficient documentation

## 2013-08-12 DIAGNOSIS — G4733 Obstructive sleep apnea (adult) (pediatric): Secondary | ICD-10-CM | POA: Diagnosis not present

## 2013-08-12 DIAGNOSIS — Z792 Long term (current) use of antibiotics: Secondary | ICD-10-CM | POA: Diagnosis not present

## 2013-08-12 DIAGNOSIS — E119 Type 2 diabetes mellitus without complications: Secondary | ICD-10-CM | POA: Insufficient documentation

## 2013-08-12 DIAGNOSIS — J189 Pneumonia, unspecified organism: Secondary | ICD-10-CM

## 2013-08-12 DIAGNOSIS — I10 Essential (primary) hypertension: Secondary | ICD-10-CM | POA: Insufficient documentation

## 2013-08-12 DIAGNOSIS — Z860100 Personal history of colon polyps, unspecified: Secondary | ICD-10-CM | POA: Insufficient documentation

## 2013-08-12 DIAGNOSIS — N529 Male erectile dysfunction, unspecified: Secondary | ICD-10-CM | POA: Diagnosis not present

## 2013-08-12 DIAGNOSIS — Z87891 Personal history of nicotine dependence: Secondary | ICD-10-CM | POA: Diagnosis not present

## 2013-08-12 DIAGNOSIS — M1710 Unilateral primary osteoarthritis, unspecified knee: Secondary | ICD-10-CM | POA: Insufficient documentation

## 2013-08-12 DIAGNOSIS — Z8614 Personal history of Methicillin resistant Staphylococcus aureus infection: Secondary | ICD-10-CM | POA: Insufficient documentation

## 2013-08-12 DIAGNOSIS — F4310 Post-traumatic stress disorder, unspecified: Secondary | ICD-10-CM | POA: Diagnosis not present

## 2013-08-12 DIAGNOSIS — R0989 Other specified symptoms and signs involving the circulatory and respiratory systems: Secondary | ICD-10-CM | POA: Diagnosis not present

## 2013-08-12 MED ORDER — DM-GUAIFENESIN ER 30-600 MG PO TB12: 1.0000 | ORAL_TABLET | Freq: Two times a day (BID) | ORAL | Status: DC

## 2013-08-12 MED ORDER — AZITHROMYCIN 250 MG PO TABS
500.0000 mg | ORAL_TABLET | Freq: Once | ORAL | Status: AC
Start: 2013-08-12 — End: 2013-08-12
  Administered 2013-08-12: 500 mg via ORAL
  Filled 2013-08-12: qty 2

## 2013-08-12 MED ORDER — AZITHROMYCIN 250 MG PO TABS: ORAL_TABLET | ORAL | Status: DC

## 2013-08-12 MED ORDER — SODIUM CHLORIDE 0.9 % IV BOLUS (SEPSIS)
1000.0000 mL | Freq: Once | INTRAVENOUS | Status: AC
  Administered 2013-08-12: 1000 mL via INTRAVENOUS

## 2013-08-12 NOTE — ED Notes (Signed)
Patient here with cough, congestion, fever and bodyaches x 5 days, using otc meds with some relief

## 2013-08-12 NOTE — ED Provider Notes (Signed)
CSN: [id number]     Arrival date & time 08/12/13  1205 History  This chart was scribed for Shanna Cisco, MD by Quintella Reichert, ED scribe.  This patient was seen in room MH12/MH12 and the patient's care was started at 3:32 PM.   Chief Complaint  Patient presents with  . Cough  . Nasal Congestion    Patient is a 67 y.o. male presenting with cough. The history is provided by the patient. No language interpreter was used.  Cough Cough characteristics:  Productive Sputum characteristics:  Green Severity:  Moderate Onset quality:  Gradual Duration:  5 days Timing:  Constant Progression:  Unchanged Chronicity:  New Smoker: no   Context: sick contacts   Associated symptoms: fever, myalgias (resolved), rhinorrhea and sore throat (resolved)   Associated symptoms: no chest pain, no diaphoresis, no ear pain, no headaches, no rash and no shortness of breath     HPI Comments: William Stokes is a 67 y.o. male who presents to the Emergency Department complaining of 5 days of persistent worsening URi symptoms including cough, congestion, rhinorrhea, and fever.  Pt states cough is productive of green sputum.  He states it is unchanged since onset.  Since Tuesday he has also had a fever up to 101 F which is relieved temporarily by ibuprofen.  Last week he had some generalized body aches which have since resolved.  He also had a sore throat for one day which has since resolved.  He denies ear pain, SOB, CP, abdominal pain, nausea, vomiting, or diarrhea.  He states he has been eating slightly less than usual.  Pt notes that a family member had recent URI symptoms but his own symptoms are taking longer to resolve.  He denies h/o cardiac or pulmonary issues.  He did receive a flu shot this year.  He has not had a pneumonia shot in the past 5 years.  Pt notes that this morning his blood sugar was 137 and it is usually around 100.  He treats his DM with pills which he states he has been taking as instructed.        Past Medical History  Diagnosis Date  . Osteoarthritis     left knee  . Hyperlipidemia     was on Crestor but has been off for a couple of months  . Glaucoma   . Primary male hypogonadism   . Adenomatous colon polyp   . LAFB (left anterior fascicular block)     per baseline EKG 2009  . Anxiety   . PTSD (post-traumatic stress disorder)     went to [name], has chronic diff. sleeping  . Erectile dysfunction   . Hypertension 3/11    takes Lisinopril daily  . Sleep apnea, obstructive     uses CPAP and last sleep study in epic from 2011  . Eczema     legs and hands  . GERD (gastroesophageal reflux disease)     takes OMeprazole daily  . Diabetes mellitus 2/09    takes Januvia-started 2wks ago  . Cataracts, bilateral     immature  . Insomnia   . History of MRSA infection     6-58yrs ago    Past Surgical History  Procedure Laterality Date  . Knee arthroscopy  1999    meniscal torn L, Dr.Dalldorf  . Colonoscopy    . Quadriceps tendon [place]  [place]    [place] leg  . Hernia repair    . Total knee arthroplasty  03/28/2012    LEFT TOTAL KNEE ARTHROPLASTY;  Surgeon: Velna Ochs, MD;  Location: [facility];  Service: Orthopedics;  Laterality: Left;  with revision tibia  . Total knee arthroplasty  08/03/2012    Procedure: TOTAL KNEE ARTHROPLASTY;  Surgeon: Velna Ochs, MD;  Location: [facility];  Service: Orthopedics;  Laterality: Right;    Family History  Problem Relation Age of Onset  . Heart disease Mother     MI age onset 74s?  . Prostate cancer Father     dx age 81s  . Colon cancer Neg Hx   . Diabetes Mother     History  Substance Use Topics  . Smoking status: Former Smoker -- 2.00 packs/day for 22 years    Types: Cigarettes    Quit date: 08/17/1991  . Smokeless tobacco: Never Used  . Alcohol Use: No     Review of Systems  Constitutional: Positive for fever and appetite change. Negative for diaphoresis.  HENT: Positive for congestion, rhinorrhea  and sore throat (resolved). Negative for ear pain, mouth sores and trouble swallowing.   Eyes: Negative for visual disturbance.  Respiratory: Positive for cough. Negative for chest tightness and shortness of breath.   Cardiovascular: Negative for chest pain.  Gastrointestinal: Negative for nausea, vomiting, abdominal pain, diarrhea and abdominal distention.  Endocrine: Negative for polydipsia, polyphagia and polyuria.  Genitourinary: Negative for dysuria, frequency and hematuria.  Musculoskeletal: Positive for myalgias (resolved). Negative for gait problem.  Skin: Negative for color change, pallor and rash.  Neurological: Negative for dizziness, syncope, light-headedness and headaches.  Hematological: Does not bruise/bleed easily.  Psychiatric/Behavioral: Negative for behavioral problems and confusion.     Allergies  Review of patient's allergies indicates no known allergies.  Home Medications   Current Outpatient Rx  Name  Route  Sig  Dispense  Refill  . aspirin 81 MG tablet   Oral   Take 81 mg by mouth daily.         Marland Kitchen azithromycin (ZITHROMAX Z-PAK) 250 MG tablet      Take one tab by mouth daily for 4 days beginning 08/13/13   4 each   0   . dextromethorphan-guaiFENesin (MUCINEX DM) 30-600 MG per 12 hr tablet   Oral   Take 1 tablet by mouth 2 (two) times daily.   10 tablet   0   . glucose blood (FREESTYLE LITE) test strip      Check once daily as directed.   100 each   5     Dx:250.00   . hydrocortisone 2.5 % cream   Topical   Apply topically 2 (two) times daily.   30 g   1   . ibuprofen (ADVIL,MOTRIN) 800 MG tablet   Oral   Take 800 mg by mouth 2 (two) times daily.         . Lancets (FREESTYLE) lancets      Check once daily. Dx: 250.00   100 each   5     Dx:250.00   . lisinopril (PRINIVIL,ZESTRIL) 20 MG tablet   Oral   Take 0.5 tablets (10 mg total) by mouth daily.   90 tablet   3   . omeprazole (PRILOSEC) 40 MG capsule   Oral   Take 1  capsule (40 mg total) by mouth daily.   90 capsule   3   . pravastatin (PRAVACHOL) 20 MG tablet   Oral   Take 2 tablets (40 mg total) by mouth daily.   90 tablet  3   . sertraline (ZOLOFT) 100 MG tablet   Oral   Take 150 mg by mouth daily.          . sitaGLIPtin (JANUVIA) 50 MG tablet   Oral   Take 1 tablet (50 mg total) by mouth daily.   30 tablet   1   . tadalafil (CIALIS) 20 MG tablet   Oral   Take 20 mg by mouth every other day as needed. For E. D.          BP 119/75  Pulse 104  Temp(Src) 99.3 F (37.4 C) (Oral)  Resp 16  Ht 5\' 11"  (1.803 m)  Wt 260 lb (117.935 kg)  BMI 36.28 kg/m2  SpO2 97%  Physical Exam  Nursing note and vitals reviewed. Constitutional: He is oriented to person, place, and time. He appears well-developed and well-nourished. No distress.  HENT:  Head: Normocephalic and atraumatic.  Mouth/Throat: No oropharyngeal exudate.  Eyes: Pupils are equal, round, and reactive to light.  Neck: Normal range of motion. Neck supple.  Cardiovascular: Normal rate, regular rhythm and normal heart sounds.  Exam reveals no gallop and no friction rub.   No murmur heard. Pulmonary/Chest: Effort normal. No respiratory distress. He has no wheezes. He has rales in the right lower field and the left lower field.  Abdominal: Soft. Bowel sounds are normal. He exhibits no distension and no mass. There is no tenderness. There is no rebound and no guarding.  Musculoskeletal: Normal range of motion. He exhibits no edema and no tenderness.  Neurological: He is alert and oriented to person, place, and time.  Skin: Skin is warm and dry.  Psychiatric: He has a normal mood and affect.    ED Course  Procedures (including critical care time)  DIAGNOSTIC STUDIES: Oxygen Saturation is 97% on room air, normal by my interpretation.    COORDINATION OF CARE: 3:38 PM-Informed pt that CXR and evaluation indicate possible pneumonia.  Discussed treatment plan which includes IV  fluids and antibiotics with pt at bedside and pt agreed to plan.    Labs Review Labs Reviewed - No data to display Imaging Review Dg Chest 2 View  08/12/2013   CLINICAL DATA:  Cough and congestion and fever  EXAM: CHEST  2 VIEW  COMPARISON:  Chest x-ray dated March 22, 2012.  FINDINGS: The lungs are adequately inflated. There is no focal infiltrate. The cardiopericardial silhouette is normal in size. The mediastinum is normal in width. The pulmonary vascularity is not engorged. There is no pleural effusion or pneumothorax. There is mild tortuosity of the descending thoracic aorta. The observed portions of the bony thorax exhibit no acute abnormality. There is degenerative disc space narrowing at multiple mid thoracic levels.  IMPRESSION: There is no focal pneumonia. The pulmonary interstitial markings are mildly prominent which may be due to technical factors or could reflect minimal interstitial edema or interstitial pneumonia. There is no overt evidence of CHF.   Electronically Signed   By: David  [place]   On: 08/12/2013 12:37    EKG Interpretation   None       MDM   1. CAP (community acquired pneumonia)    Pt is a 67 y.o. male with Pmhx as above who presents with about 5 days of cough, congestion, fever, chills, dec PO intake, malaise.  Denies CP, SOB, vom, d/a. On PE, pt midly tachycardic, but in NAD. +crackels at bases. CXR w/ possible interstitial pna. Will start on azithromycin and have also given  1L NS.  Pt is outside time frame for tx with tamiflu for possible pna. Return precautions given for new or worsening symptoms including CP, SOB, inability to tolerate PO.           I personally performed the services described in this documentation, which was scribed in my presence. The recorded information has been reviewed and is accurate.     Shanna Cisco, MD 08/12/13 [phone]

## 2013-08-21 ENCOUNTER — Encounter: Payer: Self-pay | Admitting: Internal Medicine

## 2013-08-21 ENCOUNTER — Ambulatory Visit (INDEPENDENT_AMBULATORY_CARE_PROVIDER_SITE_OTHER): Payer: Medicare Other | Admitting: Internal Medicine

## 2013-08-21 VITALS — BP 132/88 | HR 83 | Temp 98.0°F | Wt 271.0 lb

## 2013-08-21 DIAGNOSIS — J189 Pneumonia, unspecified organism: Secondary | ICD-10-CM

## 2013-08-21 MED ORDER — DOXYCYCLINE HYCLATE 100 MG PO TABS: 100.0000 mg | ORAL_TABLET | Freq: Two times a day (BID) | ORAL | Status: DC

## 2013-08-21 NOTE — Progress Notes (Signed)
   Subjective:    Patient ID: William Stokes, male    DOB: 1946-03-28, 68 y.o.   MRN: [id number]  HPI ER followup, chart reviewed. Patient developed fever, cough, weakness, green sputum. Went to the ER on 08/12/2013, he had crackles at the bases, chest x-ray show a question of interstitial pneumonia, he was found to be dehydrated. He was sent home after IV fluids l, rx a zpack, here for followup.  Past Medical History  Diagnosis Date  . Osteoarthritis     left knee  . Hyperlipidemia     was on Crestor but has been off for a couple of months  . Glaucoma   . Primary male hypogonadism   . Adenomatous colon polyp   . LAFB (left anterior fascicular block)     per baseline EKG 2009  . Anxiety   . PTSD (post-traumatic stress disorder)     went to [place], has chronic diff. sleeping  . Erectile dysfunction   . Hypertension 3/11    takes Lisinopril daily  . Sleep apnea, obstructive     uses CPAP and last sleep study in epic from 2011  . Eczema     legs and hands  . GERD (gastroesophageal reflux disease)     takes OMeprazole daily  . Diabetes mellitus 2/09    takes Januvia-started 2wks ago  . Cataracts, bilateral     immature  . Insomnia   . History of MRSA infection     6-48yrs ago   Past Surgical History  Procedure Laterality Date  . Knee arthroscopy  1999    meniscal torn L, Dr.Dalldorf  . Colonoscopy    . Quadriceps tendon repair  while in [place]    left leg  . Hernia repair    . Total knee arthroplasty  03/28/2012    LEFT TOTAL KNEE ARTHROPLASTY;  Surgeon: Hessie Dibble, MD;  Location: [facility];  Service: Orthopedics;  Laterality: Left;  with revision tibia  . Total knee arthroplasty  08/03/2012    Procedure: TOTAL KNEE ARTHROPLASTY;  Surgeon: Hessie Dibble, MD;  Location: [facility];  Service: Orthopedics;  Laterality: Right;    Review of Systems Since the ER visit he is doing better, tolerated antibiotics well, taking Mucinex DM No further fevers. Denies shortness or  breath or chest pain. Initially blood sugars and blood pressure were elevated, now they are  back to baseline. Denies any sinus congestion or discharge     Objective:   Physical Exam BP 132/88  Pulse 83  Temp(Src) 98 F (36.7 C)  Wt 271 lb (122.925 kg)  SpO2 98% General -- alert, well-developed, NAD.  HEENT-- Not pale.   Nose not congested.  Lungs -- normal respiratory effort, no intercostal retractions, no accessory muscle use, and normal breath sounds.  Heart-- normal rate, regular rhythm, no murmur.  Extremities-- no pretibial edema bilaterally  Neurologic--  alert & oriented X3. Speech normal, gait normal, strength normal in all extremities.   Psych-- Cognition and judgment appear intact. Cooperative with normal attention span and concentration. No anxious or depressed appearing.      Assessment & Plan:

## 2013-08-21 NOTE — Progress Notes (Signed)
Pre visit review using our clinic review tool, if applicable. No additional management support is needed unless otherwise documented below in the visit note. 

## 2013-08-21 NOTE — Assessment & Plan Note (Signed)
Pneumonia, Agree w/ the dx  of pneumonia, status post a Z-Pak, still coughing large amounts of green sputum. Otherwise he is improved. Plan: Continue Mucinex DM, doxycycline for one week. If not back to normal within 2 weeks patient to let me know, we'll need further eval.

## 2013-08-21 NOTE — Patient Instructions (Signed)
Rest, fluids , tylenol For cough, take Mucinex DM twice a day as needed   Take the antibiotic as prescribed  (doxy) Call if no back to normal within 2 weeks

## 2013-10-16 ENCOUNTER — Telehealth: Payer: Self-pay | Admitting: *Deleted

## 2013-10-16 NOTE — Telephone Encounter (Signed)
Samples placed up front for pick up. Called and left message for patient. JG//CMA

## 2013-11-19 ENCOUNTER — Telehealth: Payer: Self-pay

## 2013-11-19 NOTE — Telephone Encounter (Signed)
Medication List and allergies:  Reviewed and updated  90 day supply/mail order: na Local prescriptions: [facility] [address]  Immunizations due: PNA  A/P:   No changes to FH, PSH or Personal Hx Flu vaccine--06/2013 Tdap--2010 PNA--due Shingles--07/2013 CCS--03/2011--Dr Perry--next due 03/2014 PSA--02/2011--0.87  To Discuss with Provider: Not at this time

## 2013-11-20 ENCOUNTER — Encounter: Payer: Self-pay | Admitting: Internal Medicine

## 2013-11-20 ENCOUNTER — Ambulatory Visit (INDEPENDENT_AMBULATORY_CARE_PROVIDER_SITE_OTHER): Payer: Medicare Other | Admitting: Internal Medicine

## 2013-11-20 VITALS — BP 117/76 | HR 79 | Temp 97.9°F | Ht 70.0 in | Wt 267.0 lb

## 2013-11-20 DIAGNOSIS — E559 Vitamin D deficiency, unspecified: Secondary | ICD-10-CM

## 2013-11-20 DIAGNOSIS — Z0000 Encounter for general adult medical examination without abnormal findings: Secondary | ICD-10-CM | POA: Diagnosis not present

## 2013-11-20 DIAGNOSIS — E785 Hyperlipidemia, unspecified: Secondary | ICD-10-CM

## 2013-11-20 DIAGNOSIS — E119 Type 2 diabetes mellitus without complications: Secondary | ICD-10-CM | POA: Diagnosis not present

## 2013-11-20 DIAGNOSIS — Z23 Encounter for immunization: Secondary | ICD-10-CM

## 2013-11-20 DIAGNOSIS — I10 Essential (primary) hypertension: Secondary | ICD-10-CM

## 2013-11-20 DIAGNOSIS — Z125 Encounter for screening for malignant neoplasm of prostate: Secondary | ICD-10-CM

## 2013-11-20 DIAGNOSIS — M1990 Unspecified osteoarthritis, unspecified site: Secondary | ICD-10-CM

## 2013-11-20 LAB — COMPREHENSIVE METABOLIC PANEL
ALBUMIN: 4.1 g/dL (ref 3.5–5.2)
ALT: 26 U/L (ref 0–53)
AST: 23 U/L (ref 0–37)
Alkaline Phosphatase: 68 U/L (ref 39–117)
BUN: 18 mg/dL (ref 6–23)
CALCIUM: 9.6 mg/dL (ref 8.4–10.5)
CHLORIDE: 99 meq/L (ref 96–112)
CO2: 30 mEq/L (ref 19–32)
Creatinine, Ser: 0.9 mg/dL (ref 0.4–1.5)
GFR: 115.89 mL/min (ref >60.00)
GLUCOSE: 110 mg/dL — AB (ref 70–99)
POTASSIUM: 4.5 meq/L (ref 3.5–5.1)
SODIUM: 136 meq/L (ref 135–145)
TOTAL PROTEIN: 7.5 g/dL (ref 6.0–8.3)
Total Bilirubin: 0.6 mg/dL (ref 0.3–1.2)

## 2013-11-20 LAB — LIPID PANEL
CHOLESTEROL: 235 mg/dL — AB (ref 0–200)
HDL: 43.8 mg/dL (ref >39.00)
LDL Cholesterol: 177 mg/dL — ABNORMAL HIGH (ref 0–99)
Total CHOL/HDL Ratio: 5
Triglycerides: 73 mg/dL (ref 0.0–149.0)
VLDL: 14.6 mg/dL (ref 0.0–40.0)

## 2013-11-20 LAB — HEMOGLOBIN A1C: Hgb A1c MFr Bld: 6.7 % — ABNORMAL HIGH (ref 4.6–6.5)

## 2013-11-20 LAB — CBC WITH DIFFERENTIAL/PLATELET
Basophils Absolute: 0 10*3/uL (ref 0.0–0.1)
Basophils Relative: 0.6 % (ref 0.0–3.0)
Eosinophils Absolute: 0.4 10*3/uL (ref 0.0–0.7)
Eosinophils Relative: 4.6 % (ref 0.0–5.0)
HEMATOCRIT: 42 % (ref 39.0–52.0)
Hemoglobin: 13.9 g/dL (ref 13.0–17.0)
Lymphocytes Relative: 22.5 % (ref 12.0–46.0)
Lymphs Abs: 1.8 10*3/uL (ref 0.7–4.0)
MCHC: 33.2 g/dL (ref 30.0–36.0)
MCV: 83.3 fl (ref 78.0–100.0)
Monocytes Absolute: 0.5 10*3/uL (ref 0.1–1.0)
Monocytes Relative: 6.9 % (ref 3.0–12.0)
Neutro Abs: 5.2 10*3/uL (ref 1.4–7.7)
Neutrophils Relative %: 65.4 % (ref 43.0–77.0)
PLATELETS: 455 10*3/uL — AB (ref 150.0–400.0)
RBC: 5.04 Mil/uL (ref 4.22–5.81)
RDW: 15.9 % — ABNORMAL HIGH (ref 11.5–14.6)
WBC: 7.9 10*3/uL (ref 4.5–10.5)

## 2013-11-20 LAB — PSA: PSA: 1.08 ng/mL (ref 0.10–4.00)

## 2013-11-20 MED ORDER — TADALAFIL 20 MG PO TABS: 20.0000 mg | ORAL_TABLET | ORAL | Status: DC | PRN

## 2013-11-20 NOTE — Assessment & Plan Note (Addendum)
Td 2010  Pneumonia shot 2010 and today Recommend Prevnar zostavax-- 07-2013  Colonoscopy:   [place]  (02/28/2008) , tubular adenomas Colonoscopy 03-2011, Dr. Henrene Pastor, 3 polyps, next in 3-5 years, per GI PSA 2012 normal, recheck today DRE normal today. A healthy diet and exercise encouraged On ergocalciferol, check vitamin D levels

## 2013-11-20 NOTE — Progress Notes (Signed)
Subjective:    Patient ID: William Stokes, male    DOB: 08/04/1946, 68 y.o.   MRN: [id number]  DOS:  11/20/2013 Type of  visit:  Here for Medicare AWV: 1. Risk factors based on Past M, S, F history: reviewed 2. Physical Activities:  Active, yard work, plans to go to the gym  again 3. Depression/mood: (-) screening    4. Hearing: mild decrease per pt , f/u @ the [place] and test was neg 5. ADL's: independents, drives     6. Fall Risk: no recent falls, see instructions   7. home Safety: does feelsafe at home   8. Height, weight, &visual acuity: see VS,   sees eye doctor , dx w/ glaucoma  9. Counseling: provided 10. Labs ordered based on risk factors: if needed   11. Referral Coordination: if needed 12.  Care Plan, see assessment and plan   13.   Cognitive Assessment: Cognition seems intact, motor skills better compared to last physical   In addition, today we discussed the following:  Diabetes, good medication compliance, ambulatory blood sugars around 116 when checked. DJD, on  Motrin 800 mg twice a day.No apparent side effects High blood pressure, good medication compliance, ambulatory BPs within normal. High cholesterol, on medicines, good compliance.   ROS No  CP, SOB Denies  nausea, vomiting diarrhea Denies  blood in the stools (-) cough, sputum production (-) wheezing, chest congestion No dysuria, gross hematuria, difficulty urinating    Past Medical History  Diagnosis Date  . Osteoarthritis     left knee  . Hyperlipidemia     was on Crestor but has been off for a couple of months  . Glaucoma   . Primary male hypogonadism   . Adenomatous colon polyp   . LAFB (left anterior fascicular block)     per baseline EKG 2009  . Anxiety   . PTSD (post-traumatic stress disorder)     went to [place], has chronic diff. sleeping  . Erectile dysfunction   . Hypertension 3/11    takes Lisinopril daily  . Sleep apnea, obstructive     uses CPAP and last sleep study in epic from  2011  . Eczema     legs and hands  . GERD (gastroesophageal reflux disease)     takes OMeprazole daily  . Diabetes mellitus 2/09    takes Januvia-started 2wks ago  . Cataracts, bilateral     immature  . Insomnia     Past Surgical History  Procedure Laterality Date  . Knee arthroscopy  1999    meniscal torn L, Dr.Dalldorf  . Colonoscopy    . Quadriceps tendon repair  while in [place]    left leg  . Hernia repair    . Total knee arthroplasty  03/28/2012    LEFT TOTAL KNEE ARTHROPLASTY;  Surgeon: Hessie Dibble, MD;  Location: [facility];  Service: Orthopedics;  Laterality: Left;  with revision tibia  . Total knee arthroplasty  08/03/2012    Procedure: TOTAL KNEE ARTHROPLASTY;  Surgeon: Hessie Dibble, MD;  Location: [facility];  Service: Orthopedics;  Laterality: Right;    History   Social History  . Marital Status: Married    Spouse Name: N/A    Number of Children: 2  . Years of Education: N/A   Occupational History  . Retired from [employer]   . RETIRED    Social History Main Topics  . Smoking status: Former Smoker -- 2.00 packs/day for 22 years  Types: Cigarettes    Quit date: 08/17/1991  . Smokeless tobacco: Never Used  . Alcohol Use: No  . Drug Use: No  . Sexual Activity: Yes   Other Topics Concern  . Not on file   Social History Narrative   Born in [place], Parents from [place]   In [place] x years              Family History  Problem Relation Age of Onset  . Heart disease Mother     MI age onset 79s?  . Prostate cancer Father     dx age 77s  . Colon cancer Neg Hx   . Diabetes Mother        Medication List       This list is accurate as of: 11/20/13  6:03 PM.  Always use your most recent med list.               aspirin 81 MG tablet  Take 81 mg by mouth daily.     freestyle lancets  - Check once daily.  - Dx: 250.00     glucose blood test strip  Commonly known as:  FREESTYLE LITE  Check once daily as directed.     hydrocortisone 2.5 % cream    Apply topically 2 (two) times daily.     ibuprofen 800 MG tablet  Commonly known as:  ADVIL,MOTRIN  Take 800 mg by mouth 2 (two) times daily.     lisinopril 20 MG tablet  Commonly known as:  PRINIVIL,ZESTRIL  Take 0.5 tablets (10 mg total) by mouth daily.     omeprazole 40 MG capsule  Commonly known as:  PRILOSEC  Take 1 capsule (40 mg total) by mouth daily.     pravastatin 20 MG tablet  Commonly known as:  PRAVACHOL  Take 2 tablets (40 mg total) by mouth daily.     sitaGLIPtin 100 MG tablet  Commonly known as:  JANUVIA  Take 50 mg by mouth daily.     tadalafil 20 MG tablet  Commonly known as:  CIALIS  Take 1 tablet (20 mg total) by mouth every other day as needed. For E. D.     Vitamin D (Ergocalciferol) 50000 UNITS Caps capsule  Commonly known as:  DRISDOL  Take 50,000 Units by mouth every 7 (seven) days.           Objective:   Physical Exam BP 117/76  Pulse 79  Temp(Src) 97.9 F (36.6 C)  Ht 5\' 10"  (1.778 m)  Wt 267 lb (121.11 kg)  BMI 38.31 kg/m2  SpO2 94% General -- alert, well-developed, NAD.  Neck --no thyromegaly  HEENT-- Not pale.   Lungs -- normal respiratory effort, no intercostal retractions, no accessory muscle use, and normal breath sounds.  Heart-- normal rate, regular rhythm, no murmur.  Abdomen-- Not distended, good bowel sounds,soft, non-tender.  Rectal--  Normal sphincter tone. No rectal masses or tenderness. Brown stool Prostate--Prostate gland firm and smooth, no enlargement, nodularity, tenderness, mass, asymmetry or induration. Extremities-- no pretibial edema bilaterally  Neurologic--  alert & oriented X3. Speech normal, gait normal, strength normal in all extremities.  Psych-- Cognition and judgment appear intact. Cooperative with normal attention span and concentration. No anxious or depressed appearing.       Assessment & Plan:

## 2013-11-20 NOTE — Patient Instructions (Signed)
Get your blood work before you leave   Please consider getting immunization called PREVNAR  Motrin-----Always take it with food because may cause gastritis and ulcers. If you notice nausea, stomach pain, change in the color of stools --->  Stop the medicine and let us know  Next visit in 4-5 months, no fasting   Fall Prevention and Home Safety Falls cause injuries and can affect all age groups. It is possible to use preventive measures to significantly decrease the likelihood of falls. There are many simple measures which can make your home safer and prevent falls. OUTDOORS  Repair cracks and edges of walkways and driveways.  Remove high doorway thresholds.  Trim shrubbery on the main path into your home.  Have good outside lighting.  Clear walkways of tools, rocks, debris, and clutter.  Check that handrails are not broken and are securely fastened. Both sides of steps should have handrails.  Have leaves, snow, and ice cleared regularly.  Use sand or salt on walkways during winter months.  In the garage, clean up grease or oil spills. BATHROOM  Install night lights.  Install grab bars by the toilet and in the tub and shower.  Use non-skid mats or decals in the tub or shower.  Place a plastic non-slip stool in the shower to sit on, if needed.  Keep floors dry and clean up all water on the floor immediately.  Remove soap buildup in the tub or shower on a regular basis.  Secure bath mats with non-slip, double-sided rug tape.  Remove throw rugs and tripping hazards from the floors. BEDROOMS  Install night lights.  Make sure a bedside light is easy to reach.  Do not use oversized bedding.  Keep a telephone by your bedside.  Have a firm chair with side arms to use for getting dressed.  Remove throw rugs and tripping hazards from the floor. KITCHEN  Keep handles on pots and pans turned toward the center of the stove. Use back burners when possible.  Clean up  spills quickly and allow time for drying.  Avoid walking on wet floors.  Avoid hot utensils and knives.  Position shelves so they are not too high or low.  Place commonly used objects within easy reach.  If necessary, use a sturdy step stool with a grab bar when reaching.  Keep electrical cables out of the way.  Do not use floor polish or wax that makes floors slippery. If you must use wax, use non-skid floor wax.  Remove throw rugs and tripping hazards from the floor. STAIRWAYS  Never leave objects on stairs.  Place handrails on both sides of stairways and use them. Fix any loose handrails. Make sure handrails on both sides of the stairways are as long as the stairs.  Check carpeting to make sure it is firmly attached along stairs. Make repairs to worn or loose carpet promptly.  Avoid placing throw rugs at the top or bottom of stairways, or properly secure the rug with carpet tape to prevent slippage. Get rid of throw rugs, if possible.  Have an electrician put in a light switch at the top and bottom of the stairs. OTHER FALL PREVENTION TIPS  Wear low-heel or rubber-soled shoes that are supportive and fit well. Wear closed toe shoes.  When using a stepladder, make sure it is fully opened and both spreaders are firmly locked. Do not climb a closed stepladder.  Add color or contrast paint or tape to grab bars and handrails in  your home. Place contrasting color strips on first and last steps.  Learn and use mobility aids as needed. Install an electrical emergency response system.  Turn on lights to avoid dark areas. Replace light bulbs that burn out immediately. Get light switches that glow.  Arrange furniture to create clear pathways. Keep furniture in the same place.  Firmly attach carpet with non-skid or double-sided tape.  Eliminate uneven floor surfaces.  Select a carpet pattern that does not visually hide the edge of steps.  Be aware of all pets. OTHER HOME SAFETY  TIPS  Set the water temperature for 120 F (48.8 C).  Keep emergency numbers on or near the telephone.  Keep smoke detectors on every level of the home and near sleeping areas. Document Released: 07/23/2002 Document Revised: 02/01/2012 Document Reviewed: 10/22/2011 [facility] Patient Information 2014 [place].

## 2013-11-20 NOTE — Assessment & Plan Note (Signed)
Labs

## 2013-11-20 NOTE — Assessment & Plan Note (Signed)
Good compliance of medication, labs 

## 2013-11-20 NOTE — Assessment & Plan Note (Signed)
Well-controlled,  check a BMP 

## 2013-11-20 NOTE — Assessment & Plan Note (Signed)
High dose use of Motrin, no GI side effects. Precautions discussed again

## 2013-11-21 ENCOUNTER — Telehealth: Payer: Self-pay | Admitting: Internal Medicine

## 2013-11-21 NOTE — Telephone Encounter (Signed)
Relevant patient education assigned to patient using Emmi. ° °

## 2013-11-27 LAB — VITAMIN D 1,25 DIHYDROXY
VITAMIN D 1, 25 (OH) TOTAL: 52 pg/mL (ref 18–72)
VITAMIN D2 1, 25 (OH): 28 pg/mL
VITAMIN D3 1, 25 (OH): 24 pg/mL

## 2013-12-03 ENCOUNTER — Telehealth: Payer: Self-pay

## 2013-12-03 NOTE — Telephone Encounter (Signed)
Relevant patient education assigned to patient using Emmi. ° °

## 2013-12-21 ENCOUNTER — Telehealth: Payer: Self-pay | Admitting: Internal Medicine

## 2013-12-21 MED ORDER — SAXAGLIPTIN HCL 2.5 MG PO TABS: 2.5000 mg | ORAL_TABLET | Freq: Every day | ORAL | Status: DC

## 2013-12-21 NOTE — Telephone Encounter (Signed)
Caller name:Faye Vernell Leep Relation to pt: wife Call back [phone] Pharmacy:  Reason for call: Patient wife called to request samples of Januva. I informed patient's wife that we no longer give out samples or keep them here. Patient's wife then stated that she needs to know what else can he take in place of [place].

## 2013-12-21 NOTE — Telephone Encounter (Signed)
Other alternatives are Onglyza  5 mg qd  Tradjenta 5 mg qd  If we have better cupons we can try any of the above

## 2013-12-21 NOTE — Telephone Encounter (Signed)
Spoke with wife and advised that a new Rx for Onglyza 2.5mg  (discount card only coves 2.5mg ) #60 with instructions to take 2 daily has been sent to [facility] on [address]. Coupon has been placed at front desk for pick up to help cover cost.

## 2013-12-21 NOTE — Telephone Encounter (Signed)
The current coupons that we have for the [place] only gives the patient a 30 day trial.   Please advise if you would like to change medication or provide him the coupon for now.

## 2013-12-24 ENCOUNTER — Telehealth: Payer: Self-pay | Admitting: Internal Medicine

## 2013-12-24 NOTE — Telephone Encounter (Signed)
Discontinue onglyza Metformin 850 mg one tablet twice a day #60 , 3 refills Tell  patient to take Metformin only half tablet bid  the first 10 days to get used to it Office visit in 2 months

## 2013-12-24 NOTE — Telephone Encounter (Signed)
Caller name:  faye Relation to pt: wife Call back number:[phone]   Reason for call:  With the coupon for the RX saxagliptin HCl (ONGLYZA) 2.5 MG TABS it is still going to cost around $400 a month to fill.  Is there any other options the pt could try.  Please contact to advise.

## 2013-12-25 MED ORDER — METFORMIN HCL 850 MG PO TABS: ORAL_TABLET | ORAL | Status: DC

## 2013-12-25 NOTE — Telephone Encounter (Signed)
Spoke with the pt's wife and informed her of Dr. Ethel Rana recommendation below.  Pt's wife understood and agreed.  New rx sent to the pharmacy by e-script.//AB/CMA

## 2014-02-19 ENCOUNTER — Ambulatory Visit (INDEPENDENT_AMBULATORY_CARE_PROVIDER_SITE_OTHER): Payer: Medicare Other | Admitting: Pulmonary Disease

## 2014-02-19 ENCOUNTER — Encounter: Payer: Self-pay | Admitting: Pulmonary Disease

## 2014-02-19 VITALS — BP 122/80 | HR 104 | Temp 96.9°F | Ht 71.0 in | Wt 275.2 lb

## 2014-02-19 DIAGNOSIS — G4733 Obstructive sleep apnea (adult) (pediatric): Secondary | ICD-10-CM

## 2014-02-19 NOTE — Patient Instructions (Signed)
Continue on cpap, and keep working on weight loss Will send an order to [facility] to get you a new full face mask.  followup with me again in one year, but call if having issues with your machine.

## 2014-02-19 NOTE — Progress Notes (Signed)
   Subjective:    Patient ID: William Stokes, male    DOB: 13-Aug-1946, 68 y.o.   MRN: [id number]  HPI Patient comes in today for followup of his obstructive sleep apnea. He is trying to wear CPAP more compliantly, and feels that it is helping his sleep and daytime alertness. He will occasionally fall asleep if he goes to bed. Late without wearing the device. He currently needs a new face mask, and would like to try a full face mask again. Of note, the patient's weight is down 6 pounds from the last visit.   Review of Systems  Constitutional: Negative for fever and unexpected weight change.  HENT: Negative for congestion, dental problem, ear pain, nosebleeds, postnasal drip, rhinorrhea, sinus pressure, sneezing, sore throat and trouble swallowing.   Eyes: Negative for redness and itching.  Respiratory: Negative for cough, chest tightness, shortness of breath and wheezing.   Cardiovascular: Negative for palpitations and leg swelling.  Gastrointestinal: Negative for nausea and vomiting.  Genitourinary: Negative for dysuria.  Musculoskeletal: Negative for joint swelling.  Skin: Negative for rash.  Neurological: Negative for headaches.  Hematological: Does not bruise/bleed easily.  Psychiatric/Behavioral: Negative for dysphoric mood. The patient is not nervous/anxious.        Objective:   Physical Exam Overweight male in no acute distress Nose without purulence or discharge noted No skin breakdown or pressure necrosis from the CPAP mask Neck without lymphadenopathy or thyromegaly Lower extremities without significant edema, no cyanosis Alert and oriented, does not appear to be sleepy, moves all 4 extremities.       Assessment & Plan:

## 2014-02-19 NOTE — Assessment & Plan Note (Signed)
The patient feels that he is doing better with CPAP now than he has in the past. He tries to wear her most nights, and feels that it does help his sleep and daytime alertness. He would like to try a full face mask that is different from his current mask, and I will send the appropriate order to his home care company. I've also encouraged him to work aggressively on weight loss.

## 2014-02-27 ENCOUNTER — Encounter: Payer: Self-pay | Admitting: Internal Medicine

## 2014-03-01 ENCOUNTER — Encounter: Payer: Self-pay | Admitting: Internal Medicine

## 2014-03-25 ENCOUNTER — Encounter: Payer: Self-pay | Admitting: Internal Medicine

## 2014-03-25 ENCOUNTER — Ambulatory Visit (INDEPENDENT_AMBULATORY_CARE_PROVIDER_SITE_OTHER): Payer: Medicare Other | Admitting: Internal Medicine

## 2014-03-25 VITALS — BP 114/81 | HR 86 | Temp 98.0°F | Wt 272.0 lb

## 2014-03-25 DIAGNOSIS — E785 Hyperlipidemia, unspecified: Secondary | ICD-10-CM | POA: Diagnosis not present

## 2014-03-25 DIAGNOSIS — E119 Type 2 diabetes mellitus without complications: Secondary | ICD-10-CM

## 2014-03-25 NOTE — Patient Instructions (Signed)
Please call ASAP , talk with Shanon Brow and let us know what medicines   you are  actually taking

## 2014-03-25 NOTE — Progress Notes (Signed)
Subjective:    Patient ID: William Stokes, male    DOB: Jan 12, 1946, 68 y.o.   MRN: [id number]  DOS:  03/25/2014 Type of visit - description: f/u History:  Diabetes, ambulatory blood sugars around 120. Not sure about his medications, apparently taking metformin one tablet daily and ? onglyza High cholesterol, reports he is taking only one Pravachol? Hypertension, reports good medication compliance, ambulatory BPs usually normal.   ROS Denies nausea, vomiting, diarrhea. No cough or lower extremity edema  Past Medical History  Diagnosis Date  . Osteoarthritis     left knee  . Hyperlipidemia     was on Crestor but has been off for a couple of months  . Glaucoma   . Primary male hypogonadism   . Adenomatous colon polyp   . LAFB (left anterior fascicular block)     per baseline EKG 2009  . Anxiety   . PTSD (post-traumatic stress disorder)     went to [place], has chronic diff. sleeping  . Erectile dysfunction   . Hypertension 3/11    takes Lisinopril daily  . Sleep apnea, obstructive     uses CPAP and last sleep study in epic from 2011  . Eczema     legs and hands  . GERD (gastroesophageal reflux disease)     takes OMeprazole daily  . Diabetes mellitus 2/09    takes Januvia-started 2wks ago  . Cataracts, bilateral     immature  . Insomnia     Past Surgical History  Procedure Laterality Date  . Knee arthroscopy  1999    meniscal torn L, Dr.Dalldorf  . Colonoscopy    . Quadriceps tendon repair  while in [place]    left leg  . Hernia repair    . Total knee arthroplasty  03/28/2012    LEFT TOTAL KNEE ARTHROPLASTY;  Surgeon: Hessie Dibble, MD;  Location: [facility];  Service: Orthopedics;  Laterality: Left;  with revision tibia  . Total knee arthroplasty  08/03/2012    Procedure: TOTAL KNEE ARTHROPLASTY;  Surgeon: Hessie Dibble, MD;  Location: [facility];  Service: Orthopedics;  Laterality: Right;    History   Social History  . Marital Status: Married    Spouse Name:  N/A    Number of Children: 2  . Years of Education: N/A   Occupational History  . Retired from [employer]   . RETIRED    Social History Main Topics  . Smoking status: Former Smoker -- 2.00 packs/day for 22 years    Types: Cigarettes    Quit date: 08/17/1991  . Smokeless tobacco: Never Used  . Alcohol Use: No  . Drug Use: No  . Sexual Activity: Yes   Other Topics Concern  . Not on file   Social History Narrative   Born in [place], Parents from [place]   In [place] x years                 Medication List       This list is accurate as of: 03/25/14 10:05 AM.  Always use your most recent med list.               aspirin 81 MG tablet  Take 81 mg by mouth daily.     freestyle lancets  - Check once daily.  - Dx: 250.00     glucose blood test strip  Commonly known as:  FREESTYLE LITE  Check once daily as directed.     hydrocortisone  2.5 % cream  Apply topically 2 (two) times daily.     ibuprofen 800 MG tablet  Commonly known as:  ADVIL,MOTRIN  Take 800 mg by mouth 2 (two) times daily.     lisinopril 20 MG tablet  Commonly known as:  PRINIVIL,ZESTRIL  Take 0.5 tablets (10 mg total) by mouth daily.     metFORMIN 850 MG tablet  Commonly known as:  GLUCOPHAGE  THEN TAKE 1 TABLET TWICE A DAY.     omeprazole 40 MG capsule  Commonly known as:  PRILOSEC  Take 1 capsule (40 mg total) by mouth daily.     pravastatin 20 MG tablet  Commonly known as:  PRAVACHOL  Take 2 tablets (40 mg total) by mouth daily.     saxagliptin HCl 2.5 MG Tabs tablet  Commonly known as:  ONGLYZA  Take 1 tablet (2.5 mg total) by mouth daily.     tadalafil 20 MG tablet  Commonly known as:  CIALIS  Take 1 tablet (20 mg total) by mouth every other day as needed. For E. D.     Vitamin D (Ergocalciferol) 50000 UNITS Caps capsule  Commonly known as:  DRISDOL  Take 50,000 Units by mouth every 7 (seven) days.           Objective:   Physical Exam BP 114/81  Pulse 86  Temp(Src) 98 F (36.7  C)  Wt 272 lb (123.378 kg)  SpO2 99% General -- alert, well-developed, NAD.   Extremities-- no pretibial edema bilaterally  Neurologic--  alert & oriented X3. Speech normal, gait appropriate for age, strength symmetric and appropriate for age.  Psych-- Cognition and judgment appear intact. Cooperative with normal attention span and concentration. No anxious or depressed appearing.        Assessment & Plan:  Here for a routine checkup, the patient and does not know exactly what meds he is taking, wife helps him with medication. Plan:  to call ASAP and let us know what he is actually taking, further advice with that information.  Addendum, I called the patient's phone , spoke with the wife, he is taking only one metformin daily, not taking onglyza d/t  cost and taking Pravachol inconsistently. Plan:  Will check the A1c next week, continue d/c onglyza, strongly recommend to take medications as prescribed.

## 2014-03-25 NOTE — Progress Notes (Signed)
Pre visit review using our clinic review tool, if applicable. No additional management support is needed unless otherwise documented below in the visit note. 

## 2014-03-29 NOTE — Assessment & Plan Note (Signed)
Not taking Pravachol consistently, recommend to take 2 Pravachol daily

## 2014-03-29 NOTE — Assessment & Plan Note (Signed)
Not taking onglyza ($), taking only one metformin daily. Plan: Check A1c next week, take metformin as prescribed, discontinue onglyza

## 2014-04-05 ENCOUNTER — Other Ambulatory Visit (INDEPENDENT_AMBULATORY_CARE_PROVIDER_SITE_OTHER): Payer: Medicare Other

## 2014-04-05 DIAGNOSIS — E119 Type 2 diabetes mellitus without complications: Secondary | ICD-10-CM

## 2014-04-06 LAB — HEMOGLOBIN A1C: HEMOGLOBIN A1C: 6.8 % — AB (ref 4.6–6.5)

## 2014-04-23 ENCOUNTER — Ambulatory Visit (AMBULATORY_SURGERY_CENTER): Payer: Self-pay

## 2014-04-23 VITALS — Ht 70.0 in | Wt 268.0 lb

## 2014-04-23 DIAGNOSIS — Z860100 Personal history of colon polyps, unspecified: Secondary | ICD-10-CM

## 2014-04-23 MED ORDER — MOVIPREP 100 G PO SOLR: 1.0000 | Freq: Once | ORAL | Status: DC

## 2014-04-23 NOTE — Progress Notes (Signed)
No allergies to eggs or soy No past problems with anesthesia No diet/weight loss meds No home oxygen  Has email  Emmi instructions given for colonoscopy 

## 2014-04-29 ENCOUNTER — Telehealth: Payer: Self-pay | Admitting: Internal Medicine

## 2014-04-29 NOTE — Telephone Encounter (Signed)
CALLED [facility]  AND TALKED TO PHARMACIST PER WIFE'S REQUEST AND GAVE THEM A FREE MOVIPREP COUPON INFO OVER THE PHONE FOR PT. TO RECEIVE A FREE MOVI PREP. WIFE VERBALIZED INSTRUCTIONS OF SUCH . 04-29-2014  [facility] RN

## 2014-05-07 ENCOUNTER — Ambulatory Visit (AMBULATORY_SURGERY_CENTER): Payer: Medicare Other | Admitting: Internal Medicine

## 2014-05-07 ENCOUNTER — Encounter: Payer: Self-pay | Admitting: Internal Medicine

## 2014-05-07 VITALS — BP 144/90 | HR 64 | Temp 97.5°F | Resp 20 | Ht 70.0 in | Wt 268.0 lb

## 2014-05-07 DIAGNOSIS — F4310 Post-traumatic stress disorder, unspecified: Secondary | ICD-10-CM | POA: Diagnosis not present

## 2014-05-07 DIAGNOSIS — K620 Anal polyp: Secondary | ICD-10-CM | POA: Diagnosis not present

## 2014-05-07 DIAGNOSIS — D126 Benign neoplasm of colon, unspecified: Secondary | ICD-10-CM | POA: Diagnosis not present

## 2014-05-07 DIAGNOSIS — D120 Benign neoplasm of cecum: Secondary | ICD-10-CM

## 2014-05-07 DIAGNOSIS — Z860100 Personal history of colon polyps, unspecified: Secondary | ICD-10-CM

## 2014-05-07 DIAGNOSIS — D128 Benign neoplasm of rectum: Secondary | ICD-10-CM

## 2014-05-07 DIAGNOSIS — K621 Rectal polyp: Secondary | ICD-10-CM | POA: Diagnosis not present

## 2014-05-07 DIAGNOSIS — D129 Benign neoplasm of anus and anal canal: Secondary | ICD-10-CM

## 2014-05-07 DIAGNOSIS — I10 Essential (primary) hypertension: Secondary | ICD-10-CM | POA: Diagnosis not present

## 2014-05-07 DIAGNOSIS — G4730 Sleep apnea, unspecified: Secondary | ICD-10-CM | POA: Diagnosis not present

## 2014-05-07 LAB — GLUCOSE, CAPILLARY
Glucose-Capillary: 107 mg/dL — ABNORMAL HIGH (ref 70–99)
Glucose-Capillary: 100 mg/dL — ABNORMAL HIGH (ref 70–99)

## 2014-05-07 MED ORDER — SODIUM CHLORIDE 0.9 % IV SOLN: 500.0000 mL | INTRAVENOUS | Status: DC

## 2014-05-07 NOTE — Progress Notes (Signed)
Report to Pacu Rn, VSs, pleased with MAC. DRM

## 2014-05-07 NOTE — Patient Instructions (Signed)
Discharge instructions given with verbal understanding. Handout on polyps. Resume previous medications. YOU HAD AN ENDOSCOPIC PROCEDURE TODAY AT THE [facility]: Refer to the procedure report that was given to you for any specific questions about what was found during the examination.  If the procedure report does not answer your questions, please call your gastroenterologist to clarify.  If you requested that your care partner not be given the details of your procedure findings, then the procedure report has been included in a sealed envelope for you to review at your convenience later.  YOU SHOULD EXPECT: Some feelings of bloating in the abdomen. Passage of more gas than usual.  Walking can help get rid of the air that was put into your GI tract during the procedure and reduce the bloating. If you had a lower endoscopy (such as a colonoscopy or flexible sigmoidoscopy) you may notice spotting of blood in your stool or on the toilet paper. If you underwent a bowel prep for your procedure, then you may not have a normal bowel movement for a few days.  DIET: Your first meal following the procedure should be a light meal and then it is ok to progress to your normal diet.  A half-sandwich or bowl of soup is an example of a good first meal.  Heavy or fried foods are harder to digest and may make you feel nauseous or bloated.  Likewise meals heavy in dairy and vegetables can cause extra gas to form and this can also increase the bloating.  Drink plenty of fluids but you should avoid alcoholic beverages for 24 hours.  ACTIVITY: Your care partner should take you home directly after the procedure.  You should plan to take it easy, moving slowly for the rest of the day.  You can resume normal activity the day after the procedure however you should NOT DRIVE or use heavy machinery for 24 hours (because of the sedation medicines used during the test).    SYMPTOMS TO REPORT IMMEDIATELY: A  gastroenterologist can be reached at any hour.  During normal business hours, 8:30 AM to 5:00 PM Monday through Friday, call [phone].  After hours and on weekends, please call the GI answering service at [phone] who will take a message and have the physician on call contact you.   Following lower endoscopy (colonoscopy or flexible sigmoidoscopy):  Excessive amounts of blood in the stool  Significant tenderness or worsening of abdominal pains  Swelling of the abdomen that is new, acute  Fever of 100F or higher  FOLLOW UP: If any biopsies were taken you will be contacted by phone or by letter within the next 1-3 weeks.  Call your gastroenterologist if you have not heard about the biopsies in 3 weeks.  Our staff will call the home number listed on your records the next business day following your procedure to check on you and address any questions or concerns that you may have at that time regarding the information given to you following your procedure. This is a courtesy call and so if there is no answer at the home number and we have not heard from you through the emergency physician on call, we will assume that you have returned to your regular daily activities without incident.  SIGNATURES/CONFIDENTIALITY: You and/or your care partner have signed paperwork which will be entered into your electronic medical record.  These signatures attest to the fact that that the information above on your After Visit Summary has been   reviewed and is understood.  Full responsibility of the confidentiality of this discharge information lies with you and/or your care-partner. 

## 2014-05-07 NOTE — Op Note (Addendum)
[place]  [address] [place], [address]   COLONOSCOPY PROCEDURE REPORT  PATIENT: William Stokes, William Stokes  MR#: [id number] BIRTHDATE: [birth year] , 80  yrs. old GENDER: male ENDOSCOPIST: Eustace Quail, MD REFERRED [id number] Program Recall PROCEDURE DATE:  05/07/2014 PROCEDURE:   Colonoscopy with snare polypectomy x2 First Screening Colonoscopy - Avg.  risk and is 50 yrs.  old or older - No.  Prior Negative Screening - Now for repeat screening. N/A  History of Adenoma - Now for follow-up colonoscopy & has been > or = to 3 yrs.  Yes hx of adenoma.  Has been 3 or more years since last colonoscopy.  Polyps Removed Today? Yes. ASA CLASS:   Class II INDICATIONS:surveillance colonoscopy based on a history of adenomatous colonic polyp(s). Index exam 2009 with advanced adenoma. Followup 2012 with multiple (3) adenomas. MEDICATIONS: Propofol 300 mg DESCRIPTION OF PROCEDURE:   After the risks benefits and alternatives of the procedure were thoroughly explained, informed consent was obtained. Digital exam  revealed no abnormalities of the rectum.   The LB [id number]  endoscope was introduced through the anus and advanced to the cecum, which was identified by both the appendix and ileocecal valve. No adverse events experienced.   The quality of the prep was good, using MoviPrep The instrument was then slowly withdrawn as the colon was fully examined.  COLON FINDINGS: Two polyps measuring 5 mm in size were found in the rectum and at the cecum.  A polypectomy was performed with a cold snare.  The resection was complete, the polyp tissue was completely retrieved and sent to histology.   The colon mucosa was otherwise normal.  No other polyps, cancers or masses were seen.  Retroflexed views revealed no abnormalities. The time to cecum=5 minutes 36 seconds.  Withdrawal time=14 minutes 0 seconds.  The scope was withdrawn and the procedure completed. COMPLICATIONS:  There were no complications.  ENDOSCOPIC IMPRESSION: 1.   Two polyps were found in the rectum and at the cecum; polypectomy was performed with a cold snare 2.   The colon mucosa was otherwise normal  RECOMMENDATIONS: 1. Follow up colonoscopy in 5 years  eSigned:  Eustace Quail, MD 05/07/2014 10:13 AM   cc: Kathlene November, MD and The Patient

## 2014-05-07 NOTE — Progress Notes (Signed)
Called to room to assist during endoscopic procedure.  Patient ID and intended procedure confirmed with present staff. Received instructions for my participation in the procedure from the performing physician.  

## 2014-05-08 ENCOUNTER — Telehealth: Payer: Self-pay | Admitting: *Deleted

## 2014-05-08 NOTE — Telephone Encounter (Signed)
  Follow up Call-  Call back number 05/07/2014  Post procedure Call Back phone  # [phone]  Permission to leave phone message Yes     Patient questions:  Do you have a fever, pain , or abdominal swelling? No. Pain Score  0 *  Have you tolerated food without any problems? Yes.    Have you been able to return to your normal activities? Yes.    Do you have any questions about your discharge instructions: Diet   No. Medications  No. Follow up visit  No.  Do you have questions or concerns about your Care? No.  Actions: * If pain score is 4 or above: No action needed, pain <4.

## 2014-05-13 ENCOUNTER — Encounter: Payer: Self-pay | Admitting: Internal Medicine

## 2014-08-16 DIAGNOSIS — J9691 Respiratory failure, unspecified with hypoxia: Secondary | ICD-10-CM

## 2014-08-16 DIAGNOSIS — I4891 Unspecified atrial fibrillation: Secondary | ICD-10-CM

## 2014-08-16 DIAGNOSIS — C9250 Acute myelomonocytic leukemia, not having achieved remission: Secondary | ICD-10-CM

## 2014-08-16 DIAGNOSIS — Z8619 Personal history of other infectious and parasitic diseases: Secondary | ICD-10-CM

## 2014-08-16 HISTORY — DX: Unspecified atrial fibrillation: I48.91

## 2014-08-16 HISTORY — DX: Acute myelomonocytic leukemia, not having achieved remission: C92.50

## 2014-08-16 HISTORY — DX: Personal history of other infectious and parasitic diseases: Z86.19

## 2014-08-16 HISTORY — DX: Respiratory failure, unspecified with hypoxia: J96.91

## 2014-11-13 IMAGING — CR DG CHEST 2V
2 series · 2 of 2 positions shown · non-contrast
Comparison: Chest x-ray dated March 22, 2012.

CLINICAL DATA: Cough and congestion and fever

EXAM:
CHEST  2 VIEW

[w chest pa]
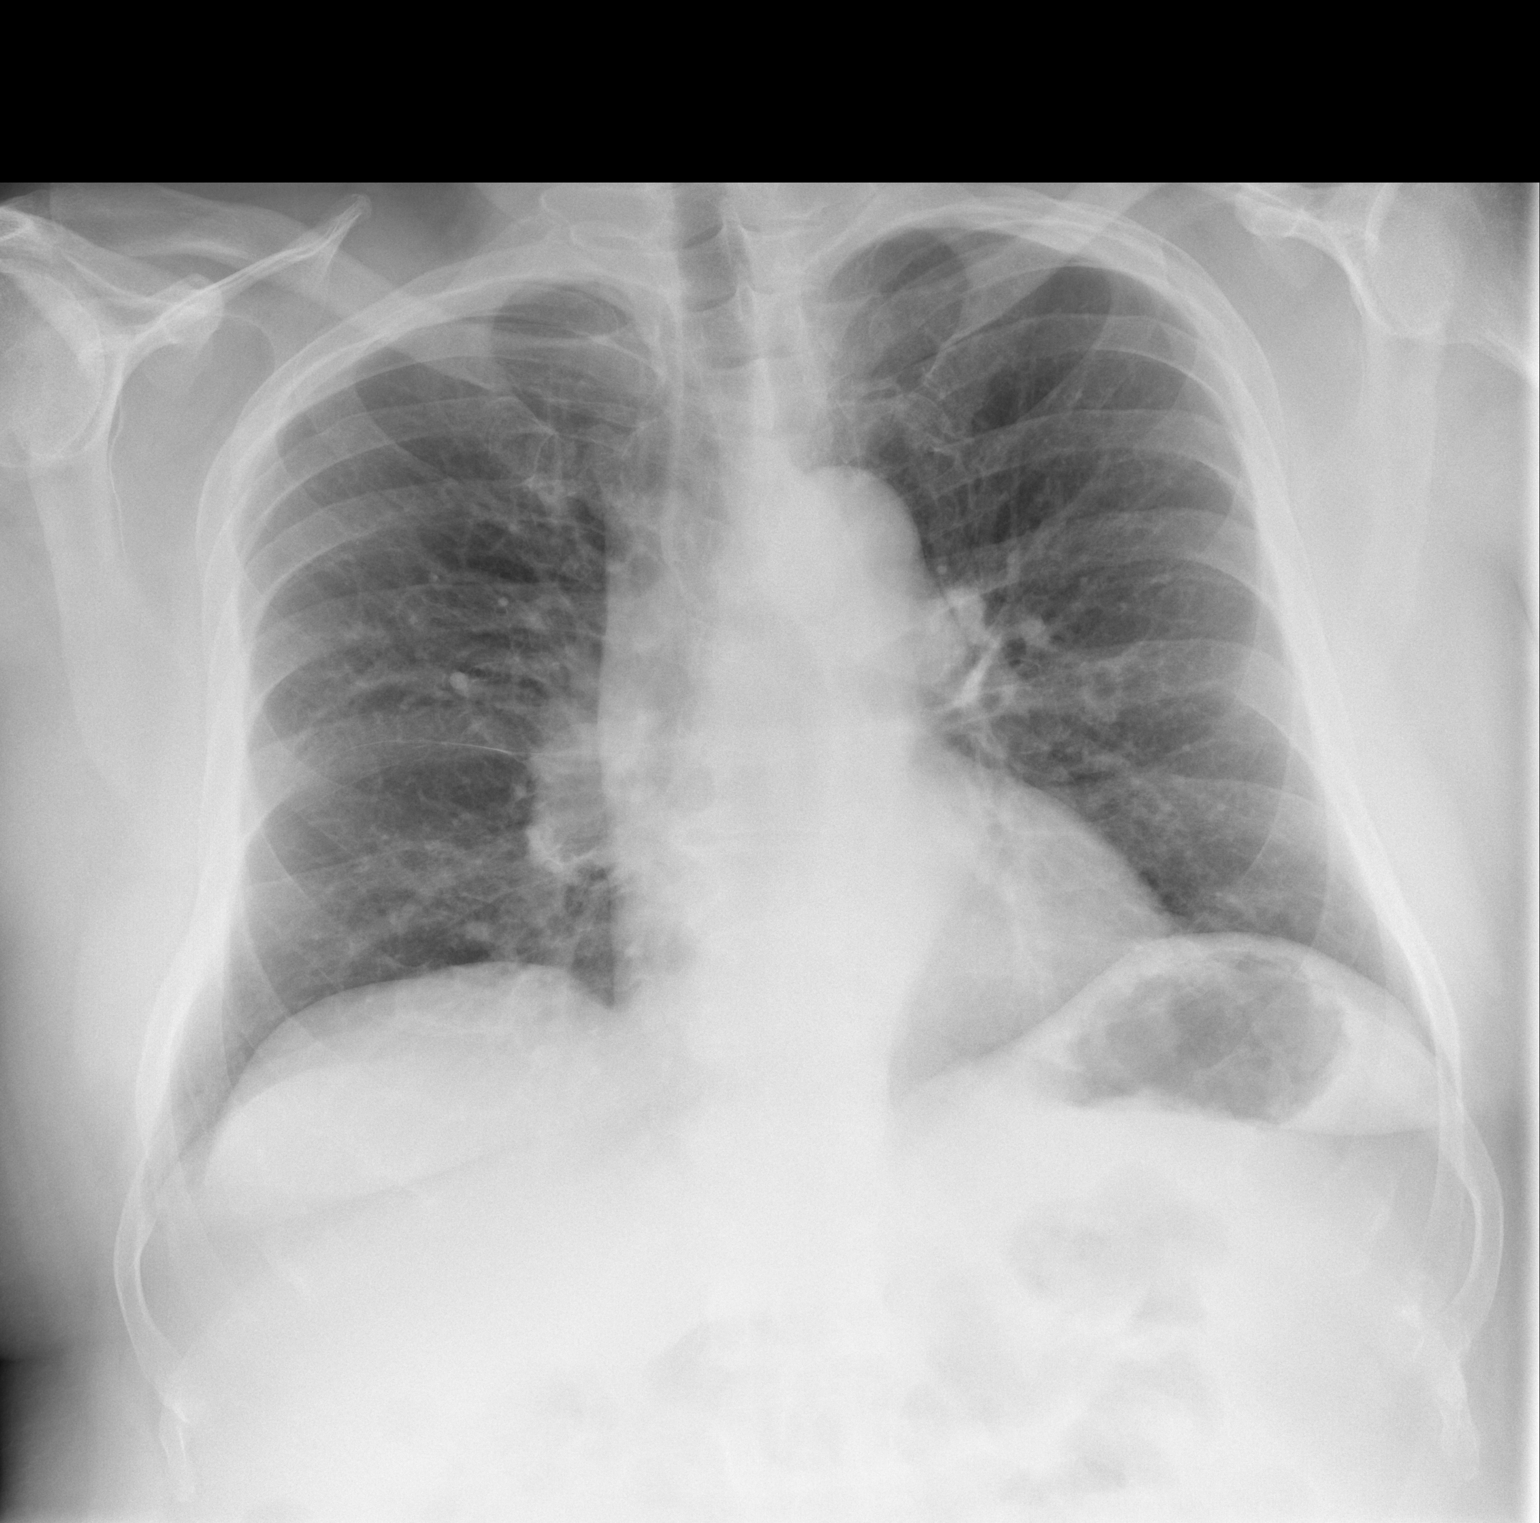

[w chest lat]
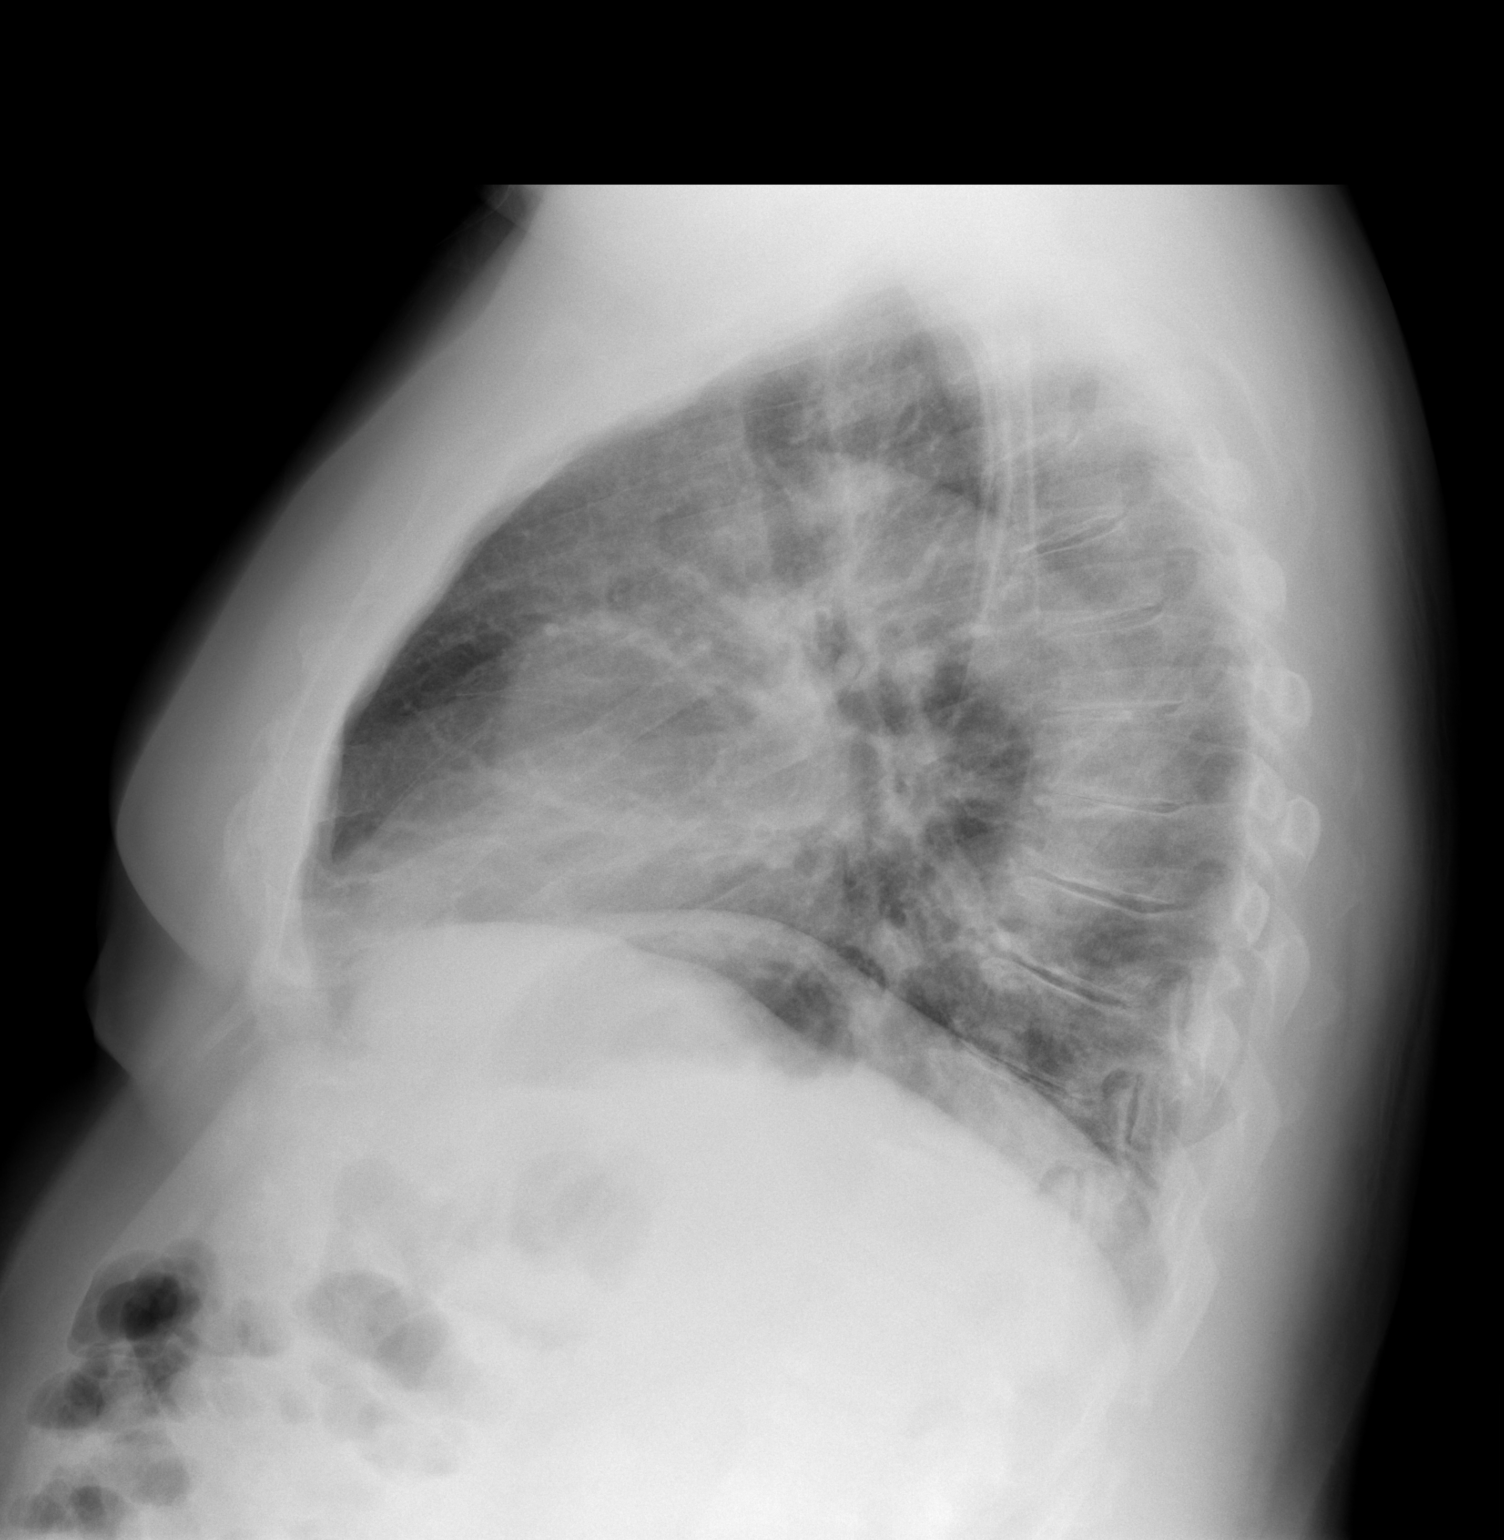

[2 of 2 positions shown; findings below may reference images not displayed]

FINDINGS: The lungs are adequately inflated. There is no focal infiltrate. The
cardiopericardial silhouette is normal in size. The mediastinum is
normal in width. The pulmonary vascularity is not engorged. There is
no pleural effusion or pneumothorax. There is mild tortuosity of the
descending thoracic aorta. The observed portions of the bony thorax
exhibit no acute abnormality. There is degenerative disc space
narrowing at multiple mid thoracic levels.
IMPRESSION: There is no focal pneumonia. The pulmonary interstitial markings are
mildly prominent which may be due to technical factors or could
reflect minimal interstitial edema or interstitial pneumonia. There
is no overt evidence of CHF.

## 2014-12-16 ENCOUNTER — Encounter: Payer: Self-pay | Admitting: Internal Medicine

## 2014-12-16 ENCOUNTER — Ambulatory Visit (INDEPENDENT_AMBULATORY_CARE_PROVIDER_SITE_OTHER): Payer: Medicare Other | Admitting: Internal Medicine

## 2014-12-16 VITALS — BP 128/68 | HR 99 | Temp 97.9°F | Ht 71.0 in | Wt 248.0 lb

## 2014-12-16 DIAGNOSIS — J0100 Acute maxillary sinusitis, unspecified: Secondary | ICD-10-CM

## 2014-12-16 MED ORDER — AMOXICILLIN-POT CLAVULANATE 875-125 MG PO TABS: 1.0000 | ORAL_TABLET | Freq: Two times a day (BID) | ORAL | Status: DC

## 2014-12-16 NOTE — Progress Notes (Signed)
Pre visit review using our clinic review tool, if applicable. No additional management support is needed unless otherwise documented below in the visit note. 

## 2014-12-16 NOTE — Patient Instructions (Signed)
Rest, fluids , tylenol take Mucinex DM twice a day until better   Flonase : 2 nasal sprays on each side of the nose daily until you feel better   Take the antibiotic as prescribed  (Augmentin) Call if not gradually better over the next  10 days Call anytime if the symptoms are severe

## 2014-12-16 NOTE — Progress Notes (Signed)
Subjective:    Patient ID: William Stokes, male    DOB: 01-Feb-1946, 69 y.o.   MRN: [id number]  DOS:  12/16/2014 Type of visit - description : Acute visit, here with his son. Interval history: Has been sick for 6 days: Sore throat, runny nose, dry cough. Symptoms increase 3 days ago with sinus pain, increased cough with yellowish sputum production and weakness. No sick contacts.    Review of Systems Denies fever chills Denies itchy eyes or nose, some sneezing. No nausea or vomiting No chest congestion or difficulty breathing  Past Medical History  Diagnosis Date  . Osteoarthritis     left knee  . Hyperlipidemia     was on Crestor but has been off for a couple of months  . Glaucoma   . Primary male hypogonadism   . Adenomatous colon polyp   . LAFB (left anterior fascicular block)     per baseline EKG 2009  . Anxiety   . PTSD (post-traumatic stress disorder)     went to [place], has chronic diff. sleeping  . Erectile dysfunction   . Hypertension 3/11    takes Lisinopril daily  . Sleep apnea, obstructive     uses CPAP and last sleep study in epic from 2011  . Eczema     legs and hands  . GERD (gastroesophageal reflux disease)     takes OMeprazole daily  . Diabetes mellitus 2/09    takes Januvia-started 2wks ago  . Cataracts, bilateral     immature  . Insomnia   . Sleep apnea     Past Surgical History  Procedure Laterality Date  . Knee arthroscopy  1999    meniscal torn L, Dr.Dalldorf  . Colonoscopy    . Quadriceps tendon repair  while in [place]    left leg  . Hernia repair    . 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 Total knee arthroplasty  08/03/2012    Procedure: TOTAL KNEE ARTHROPLASTY;  Surgeon: Hessie Dibble, MD;  Location: [facility];  Service: Orthopedics;  Laterality: Right;    History   Social History  .  Marital Status: Married    Spouse Name: N/A  . Number of Children: 2  . Years of Education: N/A   Occupational History  . Retired from [employer]   . RETIRED    Social History Main Topics  . Smoking status: Former Smoker -- 2.00 packs/day for 22 years    Types: Cigarettes    Quit date: 08/17/1991  . Smokeless tobacco: Never Used  . Alcohol Use: No  . Drug Use: No  . Sexual Activity: Yes   Other Topics Concern  . Not on file   Social History Narrative   Born in [place], Parents from [place]   In [place] x years                 Medication List       This list is accurate as of: 12/16/14 11:59 PM.  Always use your most recent med list.               amoxicillin-clavulanate 875-125 MG per tablet  Commonly known as:  AUGMENTIN  Take 1 tablet by mouth 2 (two) times daily.     aspirin 81 MG tablet  Take 81 mg by mouth daily.  buPROPion 150 MG 12 hr tablet  Commonly known as:  WELLBUTRIN SR  Take 150 mg by mouth 2 (two) times daily.     freestyle lancets  - Check once daily.  - Dx: 250.00     glucose blood test strip  Commonly known as:  FREESTYLE LITE  Check once daily as directed.     ibuprofen 800 MG tablet  Commonly known as:  ADVIL,MOTRIN  Take 800 mg by mouth 2 (two) times daily.     lisinopril 20 MG tablet  Commonly known as:  PRINIVIL,ZESTRIL  Take 0.5 tablets (10 mg total) by mouth daily.     metFORMIN 850 MG tablet  Commonly known as:  GLUCOPHAGE  THEN TAKE 1 TABLET twice A DAY.     omeprazole 40 MG capsule  Commonly known as:  PRILOSEC  Take 1 capsule (40 mg total) by mouth daily.     pravastatin 20 MG tablet  Commonly known as:  PRAVACHOL  Take 2 tablets (40 mg total) by mouth daily.     sertraline 100 MG tablet  Commonly known as:  ZOLOFT  Take 200 mg by mouth daily.     tadalafil 20 MG tablet  Commonly known as:  CIALIS  Take 1 tablet (20 mg total) by mouth every other day as needed. For E. D.     VITAMIN D (CHOLECALCIFEROL) PO    Take 5,000 Units by mouth.           Objective:   Physical Exam BP 128/68 mmHg  Pulse 99  Temp(Src) 97.9 F (36.6 C) (Oral)  Ht 5\' 11"  (1.803 m)  Wt 248 lb (112.492 kg)  BMI 34.60 kg/m2  SpO2 96% General:   Well developed, well nourished .  Nontoxic appearing, slightly weak.Marland Kitchen  HEENT:  Normocephalic . Face symmetric, atraumatic Left TM normal Right TM obscured by wax Throat symmetric, tonsil large but no red , no discharge Nose congested, sinuses not TTP Lungs:  CTA B Normal respiratory effort, no intercostal retractions, no accessory muscle use. Heart: RRR,  no murmur.   Muscle skeletal: no pretibial edema bilaterally  Skin: Not pale. Not jaundice Neurologic:  alert & oriented X3.  Speech normal, gait appropriate for age and unassisted Psych--  Cognition and judgment appear intact.  Cooperative with normal attention span and concentration.  Behavior appropriate. No anxious or depressed appearing.       Assessment & Plan:   Acute sinusitis 6 days history of upper respiratory symptoms, by history & physical most likely he has sinusitis. Plan: see instructions

## 2014-12-25 ENCOUNTER — Telehealth: Payer: Self-pay | Admitting: Internal Medicine

## 2014-12-25 MED ORDER — FLUCONAZOLE 200 MG PO TABS: 200.0000 mg | ORAL_TABLET | Freq: Every day | ORAL | Status: DC

## 2014-12-25 NOTE — Telephone Encounter (Signed)
Please ask patient if he has classic symptoms of thrush (cotton like lesions in the mouth) ; if he does then prescribed Diflucan 200 mg one by mouth daily #7 no refills. If they are not sure about the thrush or if he does not respond to treatment: Needs office visit

## 2014-12-25 NOTE — Telephone Encounter (Signed)
Please advise 

## 2014-12-25 NOTE — Telephone Encounter (Signed)
Patient's wife states he does have white lesions, mouth is sore when brushing teeth.  She feels sure it is thrush.  Diflucan sent to [facility] per wife preference.  She states she will call back to schedule an office visit if patient does not respond to the Diflucan.

## 2014-12-25 NOTE — Telephone Encounter (Signed)
Caller name: Coree Brame  Relation to pt: spouse  Call back number: [phone]   Reason for call:  Spouse states due to amoxicillin-clavulanate pt has yeast in mouth in need of diflucan. Please advise

## 2014-12-27 ENCOUNTER — Emergency Department (HOSPITAL_BASED_OUTPATIENT_CLINIC_OR_DEPARTMENT_OTHER): Payer: Medicare Other

## 2014-12-27 ENCOUNTER — Encounter (HOSPITAL_BASED_OUTPATIENT_CLINIC_OR_DEPARTMENT_OTHER): Payer: Self-pay

## 2014-12-27 ENCOUNTER — Encounter: Payer: Self-pay | Admitting: Medical

## 2014-12-27 ENCOUNTER — Ambulatory Visit (INDEPENDENT_AMBULATORY_CARE_PROVIDER_SITE_OTHER): Payer: Medicare Other | Admitting: Medical

## 2014-12-27 ENCOUNTER — Emergency Department (HOSPITAL_BASED_OUTPATIENT_CLINIC_OR_DEPARTMENT_OTHER)
Admission: EM | Admit: 2014-12-27 | Discharge: 2014-12-27 | Disposition: A | Discharge status: Other Acute Inpatient Hospital | Payer: Medicare Other | Attending: Emergency Medicine | Admitting: Emergency Medicine

## 2014-12-27 VITALS — BP 159/89 | HR 128 | Temp 103.1°F | Ht 71.0 in | Wt 241.6 lb

## 2014-12-27 DIAGNOSIS — B950 Streptococcus, group A, as the cause of diseases classified elsewhere: Secondary | ICD-10-CM | POA: Diagnosis not present

## 2014-12-27 DIAGNOSIS — R5081 Fever presenting with conditions classified elsewhere: Secondary | ICD-10-CM | POA: Diagnosis not present

## 2014-12-27 DIAGNOSIS — T8383XA Hemorrhage of genitourinary prosthetic devices, implants and grafts, initial encounter: Secondary | ICD-10-CM | POA: Diagnosis not present

## 2014-12-27 DIAGNOSIS — Z9981 Dependence on supplemental oxygen: Secondary | ICD-10-CM | POA: Diagnosis not present

## 2014-12-27 DIAGNOSIS — C9500 Acute leukemia of unspecified cell type not having achieved remission: Secondary | ICD-10-CM

## 2014-12-27 DIAGNOSIS — R0602 Shortness of breath: Secondary | ICD-10-CM

## 2014-12-27 DIAGNOSIS — J690 Pneumonitis due to inhalation of food and vomit: Secondary | ICD-10-CM | POA: Diagnosis present

## 2014-12-27 DIAGNOSIS — R7881 Bacteremia: Secondary | ICD-10-CM | POA: Diagnosis not present

## 2014-12-27 DIAGNOSIS — R1312 Dysphagia, oropharyngeal phase: Secondary | ICD-10-CM | POA: Diagnosis not present

## 2014-12-27 DIAGNOSIS — J9601 Acute respiratory failure with hypoxia: Secondary | ICD-10-CM | POA: Insufficient documentation

## 2014-12-27 DIAGNOSIS — Z86018 Personal history of other benign neoplasm: Secondary | ICD-10-CM | POA: Diagnosis not present

## 2014-12-27 DIAGNOSIS — F419 Anxiety disorder, unspecified: Secondary | ICD-10-CM | POA: Diagnosis present

## 2014-12-27 DIAGNOSIS — D696 Thrombocytopenia, unspecified: Secondary | ICD-10-CM | POA: Diagnosis not present

## 2014-12-27 DIAGNOSIS — K219 Gastro-esophageal reflux disease without esophagitis: Secondary | ICD-10-CM | POA: Diagnosis present

## 2014-12-27 DIAGNOSIS — D72829 Elevated white blood cell count, unspecified: Secondary | ICD-10-CM | POA: Diagnosis not present

## 2014-12-27 DIAGNOSIS — R590 Localized enlarged lymph nodes: Secondary | ICD-10-CM | POA: Diagnosis not present

## 2014-12-27 DIAGNOSIS — R1319 Other dysphagia: Secondary | ICD-10-CM | POA: Diagnosis not present

## 2014-12-27 DIAGNOSIS — G4700 Insomnia, unspecified: Secondary | ICD-10-CM | POA: Insufficient documentation

## 2014-12-27 DIAGNOSIS — R911 Solitary pulmonary nodule: Secondary | ICD-10-CM | POA: Diagnosis not present

## 2014-12-27 DIAGNOSIS — D649 Anemia, unspecified: Secondary | ICD-10-CM | POA: Diagnosis not present

## 2014-12-27 DIAGNOSIS — E785 Hyperlipidemia, unspecified: Secondary | ICD-10-CM | POA: Diagnosis not present

## 2014-12-27 DIAGNOSIS — I4581 Long QT syndrome: Secondary | ICD-10-CM | POA: Diagnosis not present

## 2014-12-27 DIAGNOSIS — Z79899 Other long term (current) drug therapy: Secondary | ICD-10-CM | POA: Diagnosis not present

## 2014-12-27 DIAGNOSIS — Z4682 Encounter for fitting and adjustment of non-vascular catheter: Secondary | ICD-10-CM | POA: Diagnosis not present

## 2014-12-27 DIAGNOSIS — H7490 Unspecified disorder of middle ear and mastoid, unspecified ear: Secondary | ICD-10-CM | POA: Diagnosis not present

## 2014-12-27 DIAGNOSIS — I10 Essential (primary) hypertension: Secondary | ICD-10-CM | POA: Diagnosis present

## 2014-12-27 DIAGNOSIS — R633 Feeding difficulties: Secondary | ICD-10-CM | POA: Diagnosis not present

## 2014-12-27 DIAGNOSIS — Z7982 Long term (current) use of aspirin: Secondary | ICD-10-CM | POA: Insufficient documentation

## 2014-12-27 DIAGNOSIS — E119 Type 2 diabetes mellitus without complications: Secondary | ICD-10-CM | POA: Insufficient documentation

## 2014-12-27 DIAGNOSIS — Z87891 Personal history of nicotine dependence: Secondary | ICD-10-CM | POA: Diagnosis not present

## 2014-12-27 DIAGNOSIS — M1990 Unspecified osteoarthritis, unspecified site: Secondary | ICD-10-CM | POA: Insufficient documentation

## 2014-12-27 DIAGNOSIS — R509 Fever, unspecified: Secondary | ICD-10-CM | POA: Diagnosis not present

## 2014-12-27 DIAGNOSIS — F329 Major depressive disorder, single episode, unspecified: Secondary | ICD-10-CM | POA: Diagnosis present

## 2014-12-27 DIAGNOSIS — C9590 Leukemia, unspecified not having achieved remission: Secondary | ICD-10-CM | POA: Diagnosis not present

## 2014-12-27 DIAGNOSIS — R9431 Abnormal electrocardiogram [ECG] [EKG]: Secondary | ICD-10-CM | POA: Diagnosis not present

## 2014-12-27 DIAGNOSIS — C9201 Acute myeloblastic leukemia, in remission: Secondary | ICD-10-CM | POA: Insufficient documentation

## 2014-12-27 DIAGNOSIS — I5040 Unspecified combined systolic (congestive) and diastolic (congestive) heart failure: Secondary | ICD-10-CM | POA: Diagnosis not present

## 2014-12-27 DIAGNOSIS — F4310 Post-traumatic stress disorder, unspecified: Secondary | ICD-10-CM | POA: Insufficient documentation

## 2014-12-27 DIAGNOSIS — E871 Hypo-osmolality and hyponatremia: Secondary | ICD-10-CM | POA: Diagnosis not present

## 2014-12-27 DIAGNOSIS — R918 Other nonspecific abnormal finding of lung field: Secondary | ICD-10-CM | POA: Diagnosis not present

## 2014-12-27 DIAGNOSIS — Z6832 Body mass index (BMI) 32.0-32.9, adult: Secondary | ICD-10-CM | POA: Diagnosis not present

## 2014-12-27 DIAGNOSIS — I493 Ventricular premature depolarization: Secondary | ICD-10-CM | POA: Diagnosis not present

## 2014-12-27 DIAGNOSIS — I270 Primary pulmonary hypertension: Secondary | ICD-10-CM | POA: Diagnosis not present

## 2014-12-27 DIAGNOSIS — G4730 Sleep apnea, unspecified: Secondary | ICD-10-CM | POA: Insufficient documentation

## 2014-12-27 DIAGNOSIS — N521 Erectile dysfunction due to diseases classified elsewhere: Secondary | ICD-10-CM | POA: Diagnosis present

## 2014-12-27 DIAGNOSIS — Z9911 Dependence on respirator [ventilator] status: Secondary | ICD-10-CM | POA: Diagnosis not present

## 2014-12-27 DIAGNOSIS — G4733 Obstructive sleep apnea (adult) (pediatric): Secondary | ICD-10-CM | POA: Diagnosis not present

## 2014-12-27 DIAGNOSIS — D709 Neutropenia, unspecified: Secondary | ICD-10-CM | POA: Diagnosis not present

## 2014-12-27 DIAGNOSIS — C9250 Acute myelomonocytic leukemia, not having achieved remission: Secondary | ICD-10-CM | POA: Diagnosis not present

## 2014-12-27 DIAGNOSIS — J9690 Respiratory failure, unspecified, unspecified whether with hypoxia or hypercapnia: Secondary | ICD-10-CM | POA: Diagnosis not present

## 2014-12-27 DIAGNOSIS — I444 Left anterior fascicular block: Secondary | ICD-10-CM | POA: Diagnosis not present

## 2014-12-27 DIAGNOSIS — I358 Other nonrheumatic aortic valve disorders: Secondary | ICD-10-CM | POA: Diagnosis not present

## 2014-12-27 DIAGNOSIS — E875 Hyperkalemia: Secondary | ICD-10-CM | POA: Diagnosis not present

## 2014-12-27 DIAGNOSIS — J810 Acute pulmonary edema: Secondary | ICD-10-CM | POA: Diagnosis not present

## 2014-12-27 DIAGNOSIS — J811 Chronic pulmonary edema: Secondary | ICD-10-CM | POA: Diagnosis not present

## 2014-12-27 DIAGNOSIS — J168 Pneumonia due to other specified infectious organisms: Secondary | ICD-10-CM | POA: Diagnosis not present

## 2014-12-27 DIAGNOSIS — D61810 Antineoplastic chemotherapy induced pancytopenia: Secondary | ICD-10-CM | POA: Diagnosis not present

## 2014-12-27 DIAGNOSIS — J384 Edema of larynx: Secondary | ICD-10-CM | POA: Diagnosis not present

## 2014-12-27 DIAGNOSIS — R531 Weakness: Secondary | ICD-10-CM | POA: Insufficient documentation

## 2014-12-27 DIAGNOSIS — Z789 Other specified health status: Secondary | ICD-10-CM | POA: Diagnosis not present

## 2014-12-27 DIAGNOSIS — R1310 Dysphagia, unspecified: Secondary | ICD-10-CM | POA: Diagnosis not present

## 2014-12-27 DIAGNOSIS — J392 Other diseases of pharynx: Secondary | ICD-10-CM | POA: Diagnosis not present

## 2014-12-27 DIAGNOSIS — R000 Tachycardia, unspecified: Secondary | ICD-10-CM | POA: Insufficient documentation

## 2014-12-27 DIAGNOSIS — I272 Other secondary pulmonary hypertension: Secondary | ICD-10-CM | POA: Diagnosis present

## 2014-12-27 DIAGNOSIS — T451X5A Adverse effect of antineoplastic and immunosuppressive drugs, initial encounter: Secondary | ICD-10-CM | POA: Diagnosis not present

## 2014-12-27 DIAGNOSIS — R748 Abnormal levels of other serum enzymes: Secondary | ICD-10-CM | POA: Diagnosis not present

## 2014-12-27 DIAGNOSIS — J90 Pleural effusion, not elsewhere classified: Secondary | ICD-10-CM | POA: Diagnosis not present

## 2014-12-27 DIAGNOSIS — Z452 Encounter for adjustment and management of vascular access device: Secondary | ICD-10-CM | POA: Diagnosis not present

## 2014-12-27 DIAGNOSIS — E870 Hyperosmolality and hypernatremia: Secondary | ICD-10-CM | POA: Diagnosis not present

## 2014-12-27 DIAGNOSIS — E669 Obesity, unspecified: Secondary | ICD-10-CM | POA: Diagnosis present

## 2014-12-27 DIAGNOSIS — R4182 Altered mental status, unspecified: Secondary | ICD-10-CM | POA: Diagnosis not present

## 2014-12-27 DIAGNOSIS — J020 Streptococcal pharyngitis: Secondary | ICD-10-CM | POA: Diagnosis not present

## 2014-12-27 DIAGNOSIS — B449 Aspergillosis, unspecified: Secondary | ICD-10-CM | POA: Diagnosis not present

## 2014-12-27 DIAGNOSIS — Z791 Long term (current) use of non-steroidal anti-inflammatories (NSAID): Secondary | ICD-10-CM | POA: Insufficient documentation

## 2014-12-27 DIAGNOSIS — G9340 Encephalopathy, unspecified: Secondary | ICD-10-CM | POA: Diagnosis not present

## 2014-12-27 DIAGNOSIS — J189 Pneumonia, unspecified organism: Secondary | ICD-10-CM | POA: Diagnosis not present

## 2014-12-27 DIAGNOSIS — J029 Acute pharyngitis, unspecified: Secondary | ICD-10-CM | POA: Diagnosis not present

## 2014-12-27 DIAGNOSIS — Z0389 Encounter for observation for other suspected diseases and conditions ruled out: Secondary | ICD-10-CM | POA: Diagnosis not present

## 2014-12-27 DIAGNOSIS — C9200 Acute myeloblastic leukemia, not having achieved remission: Secondary | ICD-10-CM | POA: Diagnosis not present

## 2014-12-27 DIAGNOSIS — D61818 Other pancytopenia: Secondary | ICD-10-CM | POA: Diagnosis not present

## 2014-12-27 DIAGNOSIS — I454 Nonspecific intraventricular block: Secondary | ICD-10-CM | POA: Diagnosis not present

## 2014-12-27 DIAGNOSIS — E8770 Fluid overload, unspecified: Secondary | ICD-10-CM | POA: Diagnosis not present

## 2014-12-27 LAB — CBC WITH DIFFERENTIAL/PLATELET
BAND NEUTROPHILS: 0 % (ref 0–10)
Basophils Absolute: 0 10*3/uL (ref 0.0–0.1)
Basophils Relative: 0 % (ref 0–1)
Blasts: 85 %
Eosinophils Absolute: 0 10*3/uL (ref 0.0–0.7)
Eosinophils Relative: 0 % (ref 0–5)
HEMATOCRIT: 27.8 % — AB (ref 39.0–52.0)
HEMOGLOBIN: 9.4 g/dL — AB (ref 13.0–17.0)
Lymphocytes Relative: 5 % — ABNORMAL LOW (ref 12–46)
Lymphs Abs: 7.4 10*3/uL — ABNORMAL HIGH (ref 0.7–4.0)
MCH: 30 pg (ref 26.0–34.0)
MCHC: 33.8 g/dL (ref 30.0–36.0)
MCV: 88.8 fL (ref 78.0–100.0)
MONO ABS: 0 10*3/uL — AB (ref 0.1–1.0)
MONOS PCT: 0 % — AB (ref 3–12)
MYELOCYTES: 0 %
Metamyelocytes Relative: 0 %
NEUTROS ABS: 14.8 10*3/uL — AB (ref 1.7–7.7)
Neutrophils Relative %: 10 % — ABNORMAL LOW (ref 43–77)
Other: 0 %
Platelets: 49 10*3/uL — ABNORMAL LOW (ref 150–400)
Promyelocytes Absolute: 0 %
RBC: 3.13 MIL/uL — ABNORMAL LOW (ref 4.22–5.81)
RDW: 17 % — AB (ref 11.5–15.5)
SMEAR REVIEW: DECREASED
WBC: 147.7 10*3/uL — AB (ref 4.0–10.5)
nRBC: 0 /100 WBC

## 2014-12-27 LAB — COMPREHENSIVE METABOLIC PANEL
ALK PHOS: 86 U/L (ref 38–126)
ALT: 42 U/L (ref 17–63)
AST: 48 U/L — ABNORMAL HIGH (ref 15–41)
Albumin: 3 g/dL — ABNORMAL LOW (ref 3.5–5.0)
Anion gap: 15 (ref 5–15)
BUN: 15 mg/dL (ref 6–20)
CHLORIDE: 93 mmol/L — AB (ref 101–111)
CO2: 24 mmol/L (ref 22–32)
Calcium: 9.4 mg/dL (ref 8.9–10.3)
Creatinine, Ser: 1.02 mg/dL (ref 0.61–1.24)
GFR calc Af Amer: >60 mL/min (ref >60)
GLUCOSE: 208 mg/dL — AB (ref 65–99)
POTASSIUM: 3.7 mmol/L (ref 3.5–5.1)
Sodium: 132 mmol/L — ABNORMAL LOW (ref 135–145)
TOTAL PROTEIN: 8.1 g/dL (ref 6.5–8.1)
Total Bilirubin: 0.8 mg/dL (ref 0.3–1.2)

## 2014-12-27 LAB — I-STAT VENOUS BLOOD GAS, ED
ACID-BASE DEFICIT: 3 mmol/L — AB (ref 0.0–2.0)
Bicarbonate: 20.6 mEq/L (ref 20.0–24.0)
O2 Saturation: 61 %
PO2 VEN: 35 mmHg (ref 30.0–45.0)
TCO2: 22 mmol/L (ref 0–100)
pCO2, Ven: 35.4 mmHg — ABNORMAL LOW (ref 45.0–50.0)
pH, Ven: 7.381 — ABNORMAL HIGH (ref 7.250–7.300)

## 2014-12-27 LAB — URINALYSIS, ROUTINE W REFLEX MICROSCOPIC
Glucose, UA: NEGATIVE mg/dL
KETONES UR: 40 mg/dL — AB
Leukocytes, UA: NEGATIVE
Nitrite: NEGATIVE
PROTEIN: 30 mg/dL — AB
Specific Gravity, Urine: 1.025 (ref 1.005–1.030)
UROBILINOGEN UA: 1 mg/dL (ref 0.0–1.0)
pH: 5.5 (ref 5.0–8.0)

## 2014-12-27 LAB — I-STAT ARTERIAL BLOOD GAS, ED
Acid-base deficit: 5 mmol/L — ABNORMAL HIGH (ref 0.0–2.0)
Bicarbonate: 20.4 mEq/L (ref 20.0–24.0)
O2 Saturation: 99 %
TCO2: 22 mmol/L (ref 0–100)
pCO2 arterial: 40.2 mmHg (ref 35.0–45.0)
pH, Arterial: 7.317 — ABNORMAL LOW (ref 7.350–7.450)
pO2, Arterial: 156 mmHg — ABNORMAL HIGH (ref 80.0–100.0)

## 2014-12-27 LAB — URINE MICROSCOPIC-ADD ON

## 2014-12-27 LAB — LACTATE DEHYDROGENASE: LDH: 771 U/L — ABNORMAL HIGH (ref 98–192)

## 2014-12-27 LAB — I-STAT CG4 LACTIC ACID, ED: Lactic Acid, Venous: 1.67 mmol/L (ref 0.5–2.0)

## 2014-12-27 LAB — BRAIN NATRIURETIC PEPTIDE: B NATRIURETIC PEPTIDE 5: 227 pg/mL — AB (ref 0.0–100.0)

## 2014-12-27 LAB — URIC ACID: Uric Acid, Serum: 5.5 mg/dL (ref 4.4–7.6)

## 2014-12-27 LAB — TROPONIN I: TROPONIN I: 1.67 ng/mL — AB (ref <0.031)

## 2014-12-27 LAB — PATHOLOGIST SMEAR REVIEW

## 2014-12-27 MED ORDER — ALLOPURINOL 300 MG PO TABS
300.0000 mg | ORAL_TABLET | Freq: Two times a day (BID) | ORAL | Status: DC
  Filled 2014-12-27: qty 1

## 2014-12-27 MED ORDER — ALLOPURINOL 300 MG PO TABS: 300.0000 mg | ORAL_TABLET | Freq: Every day | ORAL | Status: DC

## 2014-12-27 MED ORDER — SODIUM CHLORIDE 0.9 % IV BOLUS (SEPSIS)
1000.0000 mL | INTRAVENOUS | Status: DC
  Administered 2014-12-27: 1000 mL via INTRAVENOUS

## 2014-12-27 MED ORDER — MIDAZOLAM HCL 5 MG/5ML IJ SOLN
INTRAMUSCULAR | Status: AC
  Filled 2014-12-27: qty 5

## 2014-12-27 MED ORDER — ETOMIDATE 2 MG/ML IV SOLN
INTRAVENOUS | Status: AC
  Administered 2014-12-27: 15 mg via INTRAVENOUS
  Filled 2014-12-27: qty 20

## 2014-12-27 MED ORDER — HYDROXYUREA 500 MG PO CAPS: 2000.0000 mg | ORAL_CAPSULE | Freq: Once | ORAL | Status: DC

## 2014-12-27 MED ORDER — PIPERACILLIN-TAZOBACTAM 3.375 G IVPB: 3.3750 g | Freq: Three times a day (TID) | INTRAVENOUS | Status: DC

## 2014-12-27 MED ORDER — DEXAMETHASONE SODIUM PHOSPHATE 10 MG/ML IJ SOLN
10.0000 mg | Freq: Once | INTRAMUSCULAR | Status: AC
  Administered 2014-12-27: 10 mg via INTRAVENOUS

## 2014-12-27 MED ORDER — MIDAZOLAM HCL 5 MG/5ML IJ SOLN
5.0000 mg | Freq: Once | INTRAMUSCULAR | Status: AC
  Administered 2014-12-27: 2 mg via INTRAVENOUS

## 2014-12-27 MED ORDER — MIDAZOLAM HCL 2 MG/2ML IJ SOLN
2.0000 mg | Freq: Once | INTRAMUSCULAR | Status: AC
  Administered 2014-12-27: 2 mg via INTRAVENOUS

## 2014-12-27 MED ORDER — SODIUM CHLORIDE 0.9 % IV SOLN
1.0000 mg/h | INTRAVENOUS | Status: DC
  Filled 2014-12-27: qty 10

## 2014-12-27 MED ORDER — FENTANYL CITRATE (PF) 100 MCG/2ML IJ SOLN
100.0000 ug | Freq: Once | INTRAMUSCULAR | Status: AC
  Administered 2014-12-27: 100 ug via INTRAVENOUS

## 2014-12-27 MED ORDER — ALLOPURINOL SODIUM 500 MG IV SOLR
300.0000 mg | Freq: Once | INTRAVENOUS | Status: DC
  Filled 2014-12-27: qty 300

## 2014-12-27 MED ORDER — ROCURONIUM BROMIDE 50 MG/5ML IV SOLN
INTRAVENOUS | Status: AC
  Filled 2014-12-27: qty 2

## 2014-12-27 MED ORDER — BLISTEX MEDICATED EX OINT
TOPICAL_OINTMENT | CUTANEOUS | Status: DC | PRN
  Administered 2014-12-27: 12:00:00 via TOPICAL

## 2014-12-27 MED ORDER — BLISTEX MEDICATED EX OINT
TOPICAL_OINTMENT | CUTANEOUS | Status: AC
  Filled 2014-12-27: qty 10

## 2014-12-27 MED ORDER — SUCCINYLCHOLINE CHLORIDE 20 MG/ML IJ SOLN
INTRAMUSCULAR | Status: AC
  Administered 2014-12-27: 200 mg via INTRAVENOUS
  Filled 2014-12-27: qty 1

## 2014-12-27 MED ORDER — HYDROXYUREA 100 MG/ML ORAL SUSPENSION: 2000.0000 mg | Freq: Every day | ORAL | Status: DC

## 2014-12-27 MED ORDER — DEXAMETHASONE SODIUM PHOSPHATE 10 MG/ML IJ SOLN
INTRAMUSCULAR | Status: AC
  Filled 2014-12-27: qty 1

## 2014-12-27 MED ORDER — FENTANYL CITRATE (PF) 100 MCG/2ML IJ SOLN
INTRAMUSCULAR | Status: AC
  Filled 2014-12-27: qty 2

## 2014-12-27 MED ORDER — LIDOCAINE HCL (CARDIAC) 20 MG/ML IV SOLN
INTRAVENOUS | Status: AC
  Filled 2014-12-27: qty 5

## 2014-12-27 MED ORDER — VANCOMYCIN HCL IN DEXTROSE 1-5 GM/200ML-% IV SOLN
INTRAVENOUS | Status: AC
  Administered 2014-12-27: 1 g
  Filled 2014-12-27: qty 200

## 2014-12-27 MED ORDER — PIPERACILLIN-TAZOBACTAM 3.375 G IVPB 30 MIN
3.3750 g | Freq: Once | INTRAVENOUS | Status: AC
  Administered 2014-12-27: 3.375 g via INTRAVENOUS
  Filled 2014-12-27 (×2): qty 50

## 2014-12-27 MED ORDER — IBUPROFEN 400 MG PO TABS
600.0000 mg | ORAL_TABLET | Freq: Once | ORAL | Status: AC
  Administered 2014-12-27: 600 mg via ORAL
  Filled 2014-12-27 (×2): qty 1

## 2014-12-27 MED ORDER — SODIUM CHLORIDE 0.9 % IV SOLN
2000.0000 mg | Freq: Once | INTRAVENOUS | Status: AC
  Filled 2014-12-27: qty 2000

## 2014-12-27 MED ORDER — FENTANYL CITRATE (PF) 100 MCG/2ML IJ SOLN
INTRAMUSCULAR | Status: AC
  Administered 2014-12-27: 100 ug via INTRAVENOUS
  Filled 2014-12-27: qty 2

## 2014-12-27 MED ORDER — SODIUM CHLORIDE 0.9 % IV BOLUS (SEPSIS): 500.0000 mL | INTRAVENOUS | Status: DC

## 2014-12-27 MED ORDER — VANCOMYCIN HCL IN DEXTROSE 750-5 MG/150ML-% IV SOLN
750.0000 mg | Freq: Two times a day (BID) | INTRAVENOUS | Status: DC
  Filled 2014-12-27: qty 150

## 2014-12-27 MED ORDER — SODIUM CHLORIDE 0.9 % IV SOLN
INTRAVENOUS | Status: DC
Start: 2014-12-27 — End: 2014-12-27
  Administered 2014-12-27: 10:00:00 via INTRAVENOUS

## 2014-12-27 MED ORDER — MIDAZOLAM HCL 2 MG/2ML IJ SOLN
INTRAMUSCULAR | Status: AC
  Administered 2014-12-27: 2 mg via INTRAVENOUS
  Filled 2014-12-27: qty 2

## 2014-12-27 NOTE — ED Notes (Signed)
Pt also reports recent oral pain, poor appetite.  CBG upstairs 238.  Had a small dose of Boost this am c am meds.

## 2014-12-27 NOTE — Progress Notes (Signed)
Subjective:    Patient ID: William Stokes, male    DOB: May 05, 1946, 69 y.o.   MRN: [id number]  HPI  Shortness of breath, fever and low O2%.  Pt in with some recent sinus infection about 1 wk ago or so. Pt finished antibiotic on Monday. Last 2 days he feels a lot  worse. Feels lethargic. Breathing shallow and rapid and shallow.  Today some soar throat and swollen on both side of neck. Hard to swallow per family.  Hx of smoking years ago but his his 99 sat is usually normal.    Review of Systems  Constitutional: Positive for fever, chills and fatigue.  HENT: Positive for sinus pressure and sore throat.        Recenlty about 10 days ago.  Pain swallowing.  Respiratory: Negative for cough.   Gastrointestinal: Negative for abdominal pain, diarrhea, constipation and abdominal distention.  Neurological: Positive for weakness. Negative for dizziness, seizures, numbness and headaches.       So much so that he can't ambulate safely.  Psychiatric/Behavioral: Negative for behavioral problems and confusion.       Objective:   Physical Exam   General  Mental Status - Alert. General Appearance - Well groomed. Not in acute distress.  Skin Rashes- No Rashes.  HEENT Head- Normal. Ear Auditory Canal - Left- Normal. Right - Normal.Tympanic Membrane- Left- Normal. Right- Normal. Eye Sclera/Conjunctiva- Left- Normal. Right- Normal. Nose & Sinuses Nasal Mucosa- Left-  Not boggy or Congested. Right-  Not  boggy or Congested. Mouth & Throat Lips: Upper Lip- Normal: no dryness, cracking, pallor, cyanosis, or vesicular eruption. Lower Lip-Normal: no dryness, cracking, pallor, cyanosis or vesicular eruption. Buccal Mucosa- Bilateral- No Aphthous ulcers. Oropharynx- No Discharge. Palate appears swollen. Tonsils: Characteristics- Bilateral- Not well seen.  Neck Neck- Supple. No Masses. Swollen submandibular nodes both side.   Chest and Lung Exam Auscultation: Breath Sounds:- even and clear  but rapid shallow breathing.  Cardiovascular Auscultation:Rythm- Regular, rate and rhythm. Murmurs & Other Heart Sounds:Ausculatation of the heart reveal- No Murmurs.  Lymphatic Head & Neck General Head & Neck Lymphatics: Bilateral: Description- Swollen submandibular nodes both side  Past Medical History  Diagnosis Date  . Osteoarthritis     left knee  . Hyperlipidemia     was on Crestor but has been off for a couple of months  . Glaucoma   . Primary male hypogonadism   . Adenomatous colon polyp   . LAFB (left anterior fascicular block)     per baseline EKG 2009  . Anxiety   . PTSD (post-traumatic stress disorder)     went to [place], has chronic diff. sleeping  . Erectile dysfunction   . Hypertension 3/11    takes Lisinopril daily  . Sleep apnea, obstructive     uses CPAP and last sleep study in epic from 2011  . Eczema     legs and hands  . GERD (gastroesophageal reflux disease)     takes OMeprazole daily  . Diabetes mellitus 2/09    takes Januvia-started 2wks ago  . Cataracts, bilateral     immature  . Insomnia   . Sleep apnea     History   Social History  . Marital Status: Married    Spouse Name: N/A  . Number of Children: 2  . Years of Education: N/A   Occupational History  . Retired from [employer]   . RETIRED    Social History Main Topics  . Smoking status: Former Smoker --  2.00 packs/day for 22 years    Types: Cigarettes    Quit date: 08/17/1991  . Smokeless tobacco: Never Used  . Alcohol Use: No  . Drug Use: No  . Sexual Activity: Yes   Other Topics Concern  . Not on file   Social History Narrative   Born in [place], Parents from [place]   In [place] x years             Past Surgical History  Procedure Laterality Date  . Knee arthroscopy  1999    meniscal torn L, Dr.Dalldorf  . Colonoscopy    . Quadriceps [place]  [place]    [place] leg  . Hernia repair    . Total knee arthroplasty  03/28/2012    LEFT TOTAL KNEE ARTHROPLASTY;   Surgeon: Hessie Dibble, MD;  Location: [place];  Service: Orthopedics;  Laterality: Left;  with revision tibia  . Total knee arthroplasty  08/03/2012    Procedure: TOTAL KNEE ARTHROPLASTY;  Surgeon: Hessie Dibble, MD;  Location: [place];  Service: Orthopedics;  Laterality: Right;    Family History  Problem Relation Age of Onset  . Heart disease Mother     MI age onset 103s?  . Prostate cancer Father     dx age 37s  . Colon cancer Neg Hx   . Diabetes Mother     No Known Allergies  Current Outpatient Prescriptions on File Prior to Visit  Medication Sig Dispense Refill  . aspirin 81 MG tablet Take 81 mg by mouth daily.    Marland Kitchen buPROPion (WELLBUTRIN SR) 150 MG 12 hr tablet Take 150 mg by mouth 2 (two) times daily.    . fluconazole (DIFLUCAN) 200 MG tablet Take 1 tablet (200 mg total) by mouth daily. 7 tablet 0  . glucose blood (FREESTYLE LITE) test strip Check once daily as directed. 100 each 5  . Lancets (FREESTYLE) lancets Check once daily. Dx: 250.00 100 each 5  . lisinopril (PRINIVIL,ZESTRIL) 20 MG tablet Take 0.5 tablets (10 mg total) by mouth daily. 90 tablet 3  . metFORMIN (GLUCOPHAGE) 850 MG tablet THEN TAKE 1 TABLET twice A DAY.    . pravastatin (PRAVACHOL) 20 MG tablet Take 2 tablets (40 mg total) by mouth daily. 90 tablet 3  . sertraline (ZOLOFT) 100 MG tablet Take 200 mg by mouth daily.    Marland Kitchen VITAMIN D, CHOLECALCIFEROL, PO Take 5,000 Units by mouth.    Marland Kitchen amoxicillin-clavulanate (AUGMENTIN) 875-125 MG per tablet Take 1 tablet by mouth 2 (two) times daily. (Patient not taking: Reported on 12/27/2014) 20 tablet 0  . ibuprofen (ADVIL,MOTRIN) 800 MG tablet Take 800 mg by mouth 2 (two) times daily.    Marland Kitchen omeprazole (PRILOSEC) 40 MG capsule Take 1 capsule (40 mg total) by mouth daily. 90 capsule 3  . tadalafil (CIALIS) 20 MG tablet Take 1 tablet (20 mg total) by mouth every other day as needed. For E. D. (Patient not taking: Reported on 12/16/2014) 10 tablet 3   No current  facility-administered medications on file prior to visit.    BP 159/89 mmHg  Pulse 128  Temp(Src) 103.1 F (39.5 C) (Oral)  Ht 5\' 11"  (1.803 m)  Wt 241 lb 9.6 oz (109.589 kg)  BMI 33.71 kg/m2  SpO2 89%       Assessment & Plan:

## 2014-12-27 NOTE — ED Notes (Signed)
ET tube advanced to 27 @ the lips. Resp rate down to 19 and O2 sat at 90.

## 2014-12-27 NOTE — ED Notes (Signed)
Successful intubation with 7.5 et tube, initially 23 at the lips.

## 2014-12-27 NOTE — Patient Instructions (Signed)
I have talked with charge nurse and explained high fever, low 02%, swollen neck and rapid worsening condition over past 2 days.   Pt given 2L O2 in office. Pt sent down with family in wheelchair. Accompanied by lpn.

## 2014-12-27 NOTE — Assessment & Plan Note (Signed)
With low 02 sat% and some recent acute swelling in submandibular region and palate area. In light of clinical presentation and vitals decided to expidite evaluation and get pt down to ED for evaluation quickly.

## 2014-12-27 NOTE — ED Notes (Signed)
Dr. Ralene Bathe notified of trop level. No orders at this time.

## 2014-12-27 NOTE — ED Notes (Signed)
Hydrea 2000 mg PO given.  Allopurinol 300 mg PO given. Per outpatient pharmacy as ED did not have medication in formulary.

## 2014-12-27 NOTE — ED Notes (Signed)
Pt reports feels a little better, sats 95% 3.5 L.  Pt feels cooler, temp 98.7.  Wife and son at [facility].  Limit visitors to family, Mask worn with direct pt care.  2nd bag of Vanc hung primary line L AC.  New IV started in R [facility].

## 2014-12-27 NOTE — Progress Notes (Signed)
Pre visit review using our clinic review tool, if applicable. No additional management support is needed unless otherwise documented below in the visit note. 

## 2014-12-27 NOTE — ED Notes (Signed)
Pt O2 sat dropped to 86%. Pt found to be mouth breathing only. Pt encouraged to breath through his nose. O2 sat improved to 91% but pt unable to maintain nasal inhalation. Crystal, RT called to bedside. Pt placed on venti mask @ 50%. O2 sat now at 99%. Pt tolerating mask.

## 2014-12-27 NOTE — Progress Notes (Addendum)
ANTIBIOTIC CONSULT NOTE - INITIAL  Pharmacy Consult for Vancomycin + Zosyn Indication: rule out sepsis  No Known Allergies  Patient Measurements: Height: 5\' 11"  (180.3 cm) Weight: 241 lb (109.317 kg) IBW/kg (Calculated) : 75.3  Vital Signs: Temp: 102.7 F (39.3 C) (05/13 0956) Temp Source: Oral (05/13 0904) BP: 135/79 mmHg (05/13 1015) Pulse Rate: 110 (05/13 1015) Intake/Output from previous day:   Intake/Output from this shift:    Labs: No results for input(s): WBC, HGB, PLT, LABCREA, CREATININE in the last 72 hours. CrCl cannot be calculated (Patient has no serum creatinine result on file.). No results for input(s): VANCOTROUGH, VANCOPEAK, VANCORANDOM, GENTTROUGH, GENTPEAK, GENTRANDOM, TOBRATROUGH, TOBRAPEAK, TOBRARND, AMIKACINPEAK, AMIKACINTROU, AMIKACIN in the last 72 hours.   Microbiology: No results found for this or any previous visit (from the past 720 hour(s)).  Medical History: Past Medical History  Diagnosis Date  . Osteoarthritis     left knee  . Hyperlipidemia     was on Crestor but has been off for a couple of months  . Glaucoma   . Primary male hypogonadism   . Adenomatous colon polyp   . LAFB (left anterior fascicular block)     per baseline EKG 2009  . Anxiety   . PTSD (post-traumatic stress disorder)     went to [place], has chronic diff. sleeping  . Erectile dysfunction   . Hypertension 3/11    takes Lisinopril daily  . Sleep apnea, obstructive     uses CPAP and last sleep study in epic from 2011  . Eczema     legs and hands  . GERD (gastroesophageal reflux disease)     takes OMeprazole daily  . Diabetes mellitus 2/09    takes Januvia-started 2wks ago  . Cataracts, bilateral     immature  . Insomnia   . Sleep apnea     Assessment: 73 YOM recently treated for a sinus infection with Augmentin who presented to the ED on 5/13 with SOB, weakness, fever. Pharmacy consulted to start Vancomycin + Zosyn for empiric r/o sepsis coverage.    Goal of Therapy:  Vancomycin trough level 15-20 mcg/ml  Plan:  1. Vancomycin 2g IV x 1 dose to load (as already ordered) 2. Zosyn 3.375g IV x 1 dose to load (as already ordered) 3. Will f/u with BMET results for additional Vanc/Zosyn doses 4. Will continue to follow renal function, culture results, LOT, and antibiotic de-escalation plans   Alycia Rossetti, PharmD, BCPS Clinical Pharmacist Pager: [phone] 12/27/2014 10:40 AM    --------------------------------------------------------------------------- Addendum:  Labs resulted to reflect SCr 1.02, CrCl~70-75 ml/min (normalized). Will start maintenance doses.  Plan 1. Vancomycin 750 mg IV every 12 hours 2. Zosyn 3.375g IV every 8 hours (infused over 4 hours) 3. Will continue to follow renal function, culture results, LOT, and antibiotic de-escalation plans   Alycia Rossetti, PharmD, BCPS Clinical Pharmacist Pager: [phone] 12/27/2014 11:57 AM

## 2014-12-27 NOTE — ED Notes (Signed)
Pt arrives via wheelchair, pt's family reports fever high this am, took tylenol 3x 325mg  at approximately 0700 this am. Fever 102.3 now. Sats 91% on 2L.  Pt has swollen glands, weak, slow to answer.   Pt took his am meds except glucophage.  No nausea/ vomiting. Recent Diarhea while on Augmenten

## 2014-12-27 NOTE — ED Notes (Signed)
Pt suddenly began c/o not being able to breathe well. Pt c/o feeling like something is stuck in  His throat. Pt encouraged to cough and oral suctioning provided with no emesis produced. Pt moved to trauma room for intubation.

## 2014-12-27 NOTE — ED Provider Notes (Signed)
CSN: [id number]     Arrival date & time 12/27/14  0930 History   First MD Initiated Contact with Patient 12/27/14 [phone]     Chief Complaint  Patient presents with  . Fever    SOB  arrived from [place] on 2L o2     Patient is a 69 y.o. male presenting with fever. The history is provided by the patient and the spouse. No language interpreter was used.  Fever  Mr. Laser presents for evaluation of fever, SOB.  He was treated for a sinus infection two weeks ago with Augmentin.  At that time he had a lot of nasal congestion.  For the last 3-4 days he has experienced decreased appetite, progressive generalized weakness, SOB/DOE, sore throat, and dental pain.  His antibiotics were completed four days ago.  He had some diarrhea associated with the antibiotics but this has resolved.  Today he took tylenol for fever at 0730 with no improvement.  Sxs are severe, constant, worsening.    Past Medical History  Diagnosis Date  . Osteoarthritis     left knee  . Hyperlipidemia     was on Crestor but has been off for a couple of months  . Glaucoma   . Primary male hypogonadism   . Adenomatous colon polyp   . LAFB (left anterior fascicular block)     per baseline EKG 2009  . Anxiety   . PTSD (post-traumatic stress disorder)     went to [place], has chronic diff. sleeping  . Erectile dysfunction   . Hypertension 3/11    takes Lisinopril daily  . Sleep apnea, obstructive     uses CPAP and last sleep study in epic from 2011  . Eczema     legs and hands  . GERD (gastroesophageal reflux disease)     takes OMeprazole daily  . Diabetes mellitus 2/09    takes Januvia-started 2wks ago  . Cataracts, bilateral     immature  . Insomnia   . Sleep apnea    Past Surgical History  Procedure Laterality Date  . Knee arthroscopy  1999    meniscal torn L, Dr.Dalldorf  . Colonoscopy    . Quadriceps tendon [place]  [place]    left leg  . Hernia repair    . Total knee arthroplasty  03/28/2012     LEFT TOTAL KNEE ARTHROPLASTY;  Surgeon: Hessie Dibble, MD;  Location: [place];  Service: Orthopedics;  Laterality: Left;  with revision tibia  . Total knee arthroplasty  08/03/2012    Procedure: TOTAL KNEE ARTHROPLASTY;  Surgeon: Hessie Dibble, MD;  Location: [place] [place];  Service: Orthopedics;  Laterality: Right;   Family History  Problem Relation Age of Onset  . Heart disease Mother     MI age onset 73s?  . Prostate cancer Father     dx age 41s  . Colon cancer Neg Hx   . Diabetes Mother    History  Substance Use Topics  . Smoking status: Former Smoker -- 2.00 packs/day for 22 years    Types: Cigarettes    Quit date: 08/17/1991  . Smokeless tobacco: Never Used  . Alcohol Use: No    Review of Systems  Constitutional: Positive for fever.  All other systems reviewed and are negative.     Allergies  Review of patient's allergies indicates no known allergies.  Home Medications   Prior to Admission medications   Medication Sig Start Date End Date Taking? Authorizing Provider  amoxicillin-clavulanate (AUGMENTIN) 875-125 MG per tablet Take 1 tablet by mouth 2 (two) times daily. Patient not taking: Reported on 12/27/2014 12/16/14   Colon Branch, MD  aspirin 81 MG tablet Take 81 mg by mouth daily.    Historical Provider, MD  buPROPion (WELLBUTRIN SR) 150 MG 12 hr tablet Take 150 mg by mouth 2 (two) times daily.    Historical Provider, MD  fluconazole (DIFLUCAN) 200 MG tablet Take 1 tablet (200 mg total) by mouth daily. 12/25/14   Colon Branch, MD  glucose blood (FREESTYLE LITE) test strip Check once daily as directed. 01/17/13   Colon Branch, MD  ibuprofen (ADVIL,MOTRIN) 800 MG tablet Take 800 mg by mouth 2 (two) times daily.    Historical Provider, MD  Lancets (FREESTYLE) lancets Check once daily. Dx: 250.00 01/17/13   Colon Branch, MD  lisinopril (PRINIVIL,ZESTRIL) 20 MG tablet Take 0.5 tablets (10 mg total) by mouth daily. 06/27/13   Colon Branch, MD  metFORMIN (GLUCOPHAGE) 850 MG tablet THEN  TAKE 1 TABLET twice A DAY. 12/25/13   Colon Branch, MD  omeprazole (PRILOSEC) 40 MG capsule Take 1 capsule (40 mg total) by mouth daily. 06/27/13 12/16/14  Colon Branch, MD  pravastatin (PRAVACHOL) 20 MG tablet Take 2 tablets (40 mg total) by mouth daily. 06/27/13   Colon Branch, MD  sertraline (ZOLOFT) 100 MG tablet Take 200 mg by mouth daily.    Historical Provider, MD  tadalafil (CIALIS) 20 MG tablet Take 1 tablet (20 mg total) by mouth every other day as needed. For E. D. Patient not taking: Reported on 12/16/2014 11/20/13   Colon Branch, MD  VITAMIN D, CHOLECALCIFEROL, PO Take 5,000 Units by mouth.    Historical Provider, MD   BP 145/85 mmHg  Pulse 117  Temp(Src) 102.7 F (39.3 C)  Resp 31  SpO2 92% Physical Exam  Constitutional: He is oriented to person, place, and time. He appears well-developed and well-nourished. He appears distressed.  Ill appearing  HENT:  Head: Normocephalic.  Right TM obscurred by cerumen, left TM clear.  Dry mucous membranes.  Marked cervical LAD.  Poor dentition with extensive decay and upper incisors with marked gingival edema and erythema.   Eyes: EOM are normal. Pupils are equal, round, and reactive to light.  Neck: Neck supple.  Cardiovascular:  Tachycardic, no murmur  Pulmonary/Chest:  Tachypnea, decreased air movement in bilateral bases  Abdominal: Soft. There is no tenderness. There is no rebound and no guarding.  Musculoskeletal: He exhibits no edema or tenderness.  Neurological: He is alert and oriented to person, place, and time.  Generalized weakness  Skin: Skin is warm and dry. There is pallor.  Psychiatric: He has a normal mood and affect. His behavior is normal.  Nursing note and vitals reviewed.   ED Course  Procedures (including critical care time) CRITICAL CARE Performed by: Quintella Reichert   Total critical care time: 60 minutes  Critical care time was exclusive of separately billable procedures and treating other patients.  Critical care  was necessary to treat or prevent imminent or life-threatening deterioration.  Critical care was time spent personally by me on the following activities: development of treatment plan with patient and/or surrogate as well as nursing, discussions with consultants, evaluation of patient's response to treatment, examination of patient, obtaining history from patient or surrogate, ordering and performing treatments and interventions, ordering and review of laboratory studies, ordering and review of radiographic studies, pulse oximetry and re-evaluation  of patient's condition.  INTUBATION Performed by: Quintella Reichert  Required items: required blood products, implants, devices, and special equipment available Patient identity confirmed: provided demographic data and hospital-assigned identification number Time out: Immediately prior to procedure a "time out" was called to verify the correct patient, procedure, equipment, support staff and site/side marked as required.  Indications: respiratory failure  Intubation method: Glidescope Laryngoscopy   Preoxygenation: BVM  Sedatives: Etomidate Paralytic: Succinylcholine  Tube Size: 7-5 cuffed  Post-procedure assessment: chest rise and ETCO2 monitor Breath sounds: equal and absent over the epigastrium Tube secured with: ETT holder Chest x-ray interpreted by radiologist and me.  Chest x-ray findings: recommend advancing ETT  Pt with marked edema of upper airway with violaceous changes to mucosa and mucosal bleeding.  No cord edema.         Labs Review Labs Reviewed  COMPREHENSIVE METABOLIC PANEL - Abnormal; Notable for the following:    Sodium 132 (*)    Chloride 93 (*)    Glucose, Bld 208 (*)    Albumin 3.0 (*)    AST 48 (*)    All other components within normal limits  CBC WITH DIFFERENTIAL/PLATELET - Abnormal; Notable for the following:    WBC 147.7 (*)    RBC 3.13 (*)    Hemoglobin 9.4 (*)    HCT 27.8 (*)    RDW 17.0 (*)     Platelets 49 (*)    All other components within normal limits  TROPONIN I - Abnormal; Notable for the following:    Troponin I 1.67 (*)    All other components within normal limits  BRAIN NATRIURETIC PEPTIDE - Abnormal; Notable for the following:    B Natriuretic Peptide 227.0 (*)    All other components within normal limits  I-STAT VENOUS BLOOD GAS, ED - Abnormal; Notable for the following:    pH, Ven 7.381 (*)    pCO2, Ven 35.4 (*)    Acid-base deficit 3.0 (*)    All other components within normal limits  CULTURE, BLOOD (ROUTINE X 2)  CULTURE, BLOOD (ROUTINE X 2)  URINE CULTURE  URINALYSIS, ROUTINE W REFLEX MICROSCOPIC  PATHOLOGIST SMEAR REVIEW  BLOOD GAS, VENOUS  I-STAT CG4 LACTIC ACID, ED  I-STAT CG4 LACTIC ACID, ED    Imaging Review Dg Chest 2 View  12/27/2014   CLINICAL DATA:  Shortness of breath.  Fever for 4 days.  EXAM: CHEST - 2 VIEW  COMPARISON:  Two-view chest x-ray 08/12/2013 and 03/22/2012.  FINDINGS: The heart size is normal. Lung volumes are low. There is mild increase in a diffuse interstitial pattern. No focal airspace consolidation is evident. Exaggerated kyphosis of the thoracic spine is again noted.  IMPRESSION: 1. Cardiomegaly with increased diffuse interstitial pattern compatible with mild edema and congestive heart failure. 2. No focal airspace consolidation to suggest pneumonia.   Electronically Signed   By: San Morelle M.D.   On: 12/27/2014 10:38   Dg Chest Port 1 View  12/27/2014   CLINICAL DATA:  Intubation.  EXAM: PORTABLE CHEST - 1 VIEW  COMPARISON:  None.  FINDINGS: The heart size is exaggerated by lordotic view and decreased lung volumes. And endotracheal tube is now in place. This terminates 6.7 cm above the carina and above the level clavicles. Interstitial edema is increasing. No focal airspace consolidation is evident. There is no pneumothorax.  IMPRESSION: 1. The endotracheal tube terminates 6.7 cm above the carina and could be advanced for  more optimal positioning. 2. Increasing interstitial edema.  3. Decreasing lung volumes. These results were called by telephone at the time of interpretation on 12/27/2014 at 2:31 pm to Dr. Quintella Reichert , who verbally acknowledged these results.   Electronically Signed   By: San Morelle M.D.   On: 12/27/2014 14:31     EKG Interpretation   Date/Time:  Friday Dec 27 2014 09:43:54 EDT Ventricular Rate:  121 PR Interval:  136 QRS Duration: 104 QT Interval:  330 QTC Calculation: 468 R Axis:   -59 Text Interpretation:  Sinus tachycardia Left anterior fascicular block  Abnormal ECG Confirmed by Hazle Coca [phone]) on 12/27/2014 10:16:58 AM      MDM   Final diagnoses:  Acute leukemia  Febrile illness, acute    Pt here for evaluation of fever, progressive weakness.  Pt ill appearing on exam with tachpnea, pallor.  Sepsis protocol initiated due to gingival erythema/induration, cervical LAD - concern for serious odontogenic infection.  On laboratory analysis there is marked leukocytosis concern for acute leukemia.  Discussed the patient with Dr. Marin Olp with Onocology, who reviewed the peripheral smear.  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    1210 - began discussions with [facility] regarding transfer.    Discussed with Dr. Florene Glen with Hematology Oncology at [facility], recommends transfer for emergent plamaphoresis.  Discussed with Dr. Toney Rakes in the Emergency Department regarding the patient and he agrees to accept the patient in transfer.   Patient and family updated of lab findings and critical nature of illness.   Discussed with Dr. Marin Olp regarding treatment options that can be initiated in the ED prior to transfer - recommends allopurinol and hydrea.  Initially IV forms were ordered, but these are not available in the ED.  Oral versions were given in the ED prior to transfer: allopurinol 300 mg po once and hydrea 2,000mg  po once.     Pt developed stridor just prior to transfer with increased WOB, hypoxia, diaphoresis.  Pt given decadron 10 mg IV once due to concern for airway edema.  Pt intubated emergently for respiratory failure.  Updated accepting physician of change in patient status.        Quintella Reichert, MD 12/27/14 [phone]

## 2014-12-27 NOTE — ED Notes (Signed)
Pt leaving via [facility] now.

## 2014-12-28 LAB — URINE CULTURE
Colony Count: NO GROWTH
Culture: NO GROWTH

## 2014-12-30 LAB — CULTURE, BLOOD (ROUTINE X 2)

## 2015-01-01 ENCOUNTER — Telehealth: Payer: Self-pay | Admitting: Internal Medicine

## 2015-01-01 NOTE — Telephone Encounter (Signed)
Caller name: William Stokes Relationship to patient: wife Can be reached: [phone]  Reason for call: Returning call for Dr. Larose Kells. Calling to update you on pt condition. He was here 12/27/14 and sent to ED from the office.

## 2015-01-01 NOTE — Telephone Encounter (Signed)
Wife stated to notify Dr. Larose Kells. Forwarding in case Percell Miller called her.

## 2015-01-01 NOTE — Telephone Encounter (Signed)
Pt was seen by Percell Miller on 12/27/2014, not by Dr. Larose Kells.

## 2015-01-16 LAB — HEPATIC FUNCTION PANEL
ALT: 97 U/L — AB (ref 10–40)
AST: 41 U/L — AB (ref 14–40)
Alkaline Phosphatase: 79 U/L (ref 25–125)
BILIRUBIN, TOTAL: 0.6 mg/dL

## 2015-01-16 LAB — BASIC METABOLIC PANEL
BUN: 9 mg/dL (ref 4–21)
Creatinine: 0.8 mg/dL (ref <=1.3)
Glucose: 81 mg/dL
Potassium: 4.3 mmol/L (ref 3.4–5.3)
SODIUM: 132 mmol/L — AB (ref 137–147)

## 2015-01-16 LAB — CBC AND DIFFERENTIAL
HCT: 25 % — AB (ref 41–53)
HEMOGLOBIN: 8.5 g/dL — AB (ref 13.5–17.5)
NEUTROS ABS: 0 /uL
Platelets: 267 10*3/uL (ref 150–399)
WBC: 2.9 10*3/mL

## 2015-01-16 LAB — POCT INR: INR: 1.2 — AB (ref <=1.1)

## 2015-01-17 DIAGNOSIS — D709 Neutropenia, unspecified: Secondary | ICD-10-CM | POA: Diagnosis not present

## 2015-01-17 DIAGNOSIS — R4182 Altered mental status, unspecified: Secondary | ICD-10-CM | POA: Diagnosis not present

## 2015-01-17 DIAGNOSIS — I10 Essential (primary) hypertension: Secondary | ICD-10-CM | POA: Diagnosis present

## 2015-01-17 DIAGNOSIS — C9250 Acute myelomonocytic leukemia, not having achieved remission: Secondary | ICD-10-CM | POA: Diagnosis not present

## 2015-01-17 DIAGNOSIS — Z87891 Personal history of nicotine dependence: Secondary | ICD-10-CM | POA: Diagnosis not present

## 2015-01-17 DIAGNOSIS — Z79899 Other long term (current) drug therapy: Secondary | ICD-10-CM | POA: Diagnosis not present

## 2015-01-17 DIAGNOSIS — R0602 Shortness of breath: Secondary | ICD-10-CM | POA: Diagnosis not present

## 2015-01-17 DIAGNOSIS — I447 Left bundle-branch block, unspecified: Secondary | ICD-10-CM | POA: Diagnosis not present

## 2015-01-17 DIAGNOSIS — G4733 Obstructive sleep apnea (adult) (pediatric): Secondary | ICD-10-CM | POA: Diagnosis present

## 2015-01-17 DIAGNOSIS — R5381 Other malaise: Secondary | ICD-10-CM | POA: Diagnosis not present

## 2015-01-17 DIAGNOSIS — R5081 Fever presenting with conditions classified elsewhere: Secondary | ICD-10-CM | POA: Diagnosis present

## 2015-01-17 DIAGNOSIS — R000 Tachycardia, unspecified: Secondary | ICD-10-CM | POA: Diagnosis not present

## 2015-01-17 DIAGNOSIS — R1319 Other dysphagia: Secondary | ICD-10-CM | POA: Diagnosis not present

## 2015-01-17 DIAGNOSIS — M1990 Unspecified osteoarthritis, unspecified site: Secondary | ICD-10-CM | POA: Diagnosis present

## 2015-01-17 DIAGNOSIS — R1310 Dysphagia, unspecified: Secondary | ICD-10-CM | POA: Diagnosis not present

## 2015-01-17 DIAGNOSIS — Z1623 Resistance to quinolones and fluoroquinolones: Secondary | ICD-10-CM | POA: Diagnosis present

## 2015-01-17 DIAGNOSIS — Z794 Long term (current) use of insulin: Secondary | ICD-10-CM | POA: Diagnosis not present

## 2015-01-17 DIAGNOSIS — C9200 Acute myeloblastic leukemia, not having achieved remission: Secondary | ICD-10-CM | POA: Diagnosis not present

## 2015-01-17 DIAGNOSIS — N521 Erectile dysfunction due to diseases classified elsewhere: Secondary | ICD-10-CM | POA: Diagnosis present

## 2015-01-17 DIAGNOSIS — I4581 Long QT syndrome: Secondary | ICD-10-CM | POA: Diagnosis not present

## 2015-01-17 DIAGNOSIS — R633 Feeding difficulties: Secondary | ICD-10-CM | POA: Diagnosis not present

## 2015-01-17 DIAGNOSIS — C9300 Acute monoblastic/monocytic leukemia, not having achieved remission: Secondary | ICD-10-CM | POA: Diagnosis not present

## 2015-01-17 DIAGNOSIS — G9389 Other specified disorders of brain: Secondary | ICD-10-CM | POA: Diagnosis not present

## 2015-01-17 DIAGNOSIS — N3944 Nocturnal enuresis: Secondary | ICD-10-CM | POA: Diagnosis not present

## 2015-01-17 DIAGNOSIS — E119 Type 2 diabetes mellitus without complications: Secondary | ICD-10-CM | POA: Diagnosis not present

## 2015-01-17 DIAGNOSIS — N390 Urinary tract infection, site not specified: Secondary | ICD-10-CM | POA: Diagnosis not present

## 2015-01-17 DIAGNOSIS — B9620 Unspecified Escherichia coli [E. coli] as the cause of diseases classified elsewhere: Secondary | ICD-10-CM | POA: Diagnosis not present

## 2015-01-17 DIAGNOSIS — F419 Anxiety disorder, unspecified: Secondary | ICD-10-CM | POA: Diagnosis present

## 2015-01-17 DIAGNOSIS — I5040 Unspecified combined systolic (congestive) and diastolic (congestive) heart failure: Secondary | ICD-10-CM | POA: Diagnosis not present

## 2015-01-17 DIAGNOSIS — Z7409 Other reduced mobility: Secondary | ICD-10-CM | POA: Diagnosis not present

## 2015-01-17 DIAGNOSIS — F4310 Post-traumatic stress disorder, unspecified: Secondary | ICD-10-CM | POA: Diagnosis present

## 2015-01-17 DIAGNOSIS — K219 Gastro-esophageal reflux disease without esophagitis: Secondary | ICD-10-CM | POA: Diagnosis present

## 2015-01-17 DIAGNOSIS — I5042 Chronic combined systolic (congestive) and diastolic (congestive) heart failure: Secondary | ICD-10-CM | POA: Diagnosis present

## 2015-01-17 DIAGNOSIS — G723 Periodic paralysis: Secondary | ICD-10-CM | POA: Insufficient documentation

## 2015-01-21 DIAGNOSIS — Z7409 Other reduced mobility: Secondary | ICD-10-CM | POA: Insufficient documentation

## 2015-01-27 DIAGNOSIS — R0602 Shortness of breath: Secondary | ICD-10-CM | POA: Diagnosis not present

## 2015-01-27 DIAGNOSIS — N529 Male erectile dysfunction, unspecified: Secondary | ICD-10-CM | POA: Diagnosis present

## 2015-01-27 DIAGNOSIS — I4891 Unspecified atrial fibrillation: Secondary | ICD-10-CM | POA: Diagnosis present

## 2015-01-27 DIAGNOSIS — C9200 Acute myeloblastic leukemia, not having achieved remission: Secondary | ICD-10-CM | POA: Diagnosis present

## 2015-01-27 DIAGNOSIS — R509 Fever, unspecified: Secondary | ICD-10-CM | POA: Diagnosis not present

## 2015-01-27 DIAGNOSIS — F418 Other specified anxiety disorders: Secondary | ICD-10-CM | POA: Diagnosis present

## 2015-01-27 DIAGNOSIS — E119 Type 2 diabetes mellitus without complications: Secondary | ICD-10-CM | POA: Diagnosis present

## 2015-01-27 DIAGNOSIS — D61810 Antineoplastic chemotherapy induced pancytopenia: Secondary | ICD-10-CM | POA: Diagnosis not present

## 2015-01-27 DIAGNOSIS — N390 Urinary tract infection, site not specified: Secondary | ICD-10-CM | POA: Diagnosis not present

## 2015-01-27 DIAGNOSIS — D509 Iron deficiency anemia, unspecified: Secondary | ICD-10-CM | POA: Diagnosis not present

## 2015-01-27 DIAGNOSIS — R4922 Hyponasality: Secondary | ICD-10-CM | POA: Diagnosis present

## 2015-01-27 DIAGNOSIS — D696 Thrombocytopenia, unspecified: Secondary | ICD-10-CM | POA: Diagnosis not present

## 2015-01-27 DIAGNOSIS — I10 Essential (primary) hypertension: Secondary | ICD-10-CM | POA: Diagnosis present

## 2015-01-27 DIAGNOSIS — R21 Rash and other nonspecific skin eruption: Secondary | ICD-10-CM | POA: Diagnosis present

## 2015-01-27 DIAGNOSIS — A419 Sepsis, unspecified organism: Secondary | ICD-10-CM | POA: Diagnosis not present

## 2015-01-27 DIAGNOSIS — N3080 Other cystitis without hematuria: Secondary | ICD-10-CM | POA: Diagnosis present

## 2015-01-27 DIAGNOSIS — I5040 Unspecified combined systolic (congestive) and diastolic (congestive) heart failure: Secondary | ICD-10-CM | POA: Diagnosis present

## 2015-01-27 DIAGNOSIS — D709 Neutropenia, unspecified: Secondary | ICD-10-CM | POA: Diagnosis present

## 2015-01-27 DIAGNOSIS — R6521 Severe sepsis with septic shock: Secondary | ICD-10-CM | POA: Diagnosis not present

## 2015-01-27 DIAGNOSIS — E871 Hypo-osmolality and hyponatremia: Secondary | ICD-10-CM | POA: Diagnosis present

## 2015-01-27 DIAGNOSIS — E785 Hyperlipidemia, unspecified: Secondary | ICD-10-CM | POA: Diagnosis present

## 2015-01-27 DIAGNOSIS — R918 Other nonspecific abnormal finding of lung field: Secondary | ICD-10-CM | POA: Diagnosis not present

## 2015-01-27 DIAGNOSIS — R4182 Altered mental status, unspecified: Secondary | ICD-10-CM | POA: Diagnosis not present

## 2015-01-27 DIAGNOSIS — R002 Palpitations: Secondary | ICD-10-CM | POA: Diagnosis not present

## 2015-01-27 DIAGNOSIS — J9601 Acute respiratory failure with hypoxia: Secondary | ICD-10-CM | POA: Diagnosis present

## 2015-01-27 DIAGNOSIS — J189 Pneumonia, unspecified organism: Secondary | ICD-10-CM | POA: Diagnosis present

## 2015-01-27 DIAGNOSIS — F4310 Post-traumatic stress disorder, unspecified: Secondary | ICD-10-CM | POA: Diagnosis present

## 2015-01-27 DIAGNOSIS — A499 Bacterial infection, unspecified: Secondary | ICD-10-CM | POA: Diagnosis not present

## 2015-01-27 DIAGNOSIS — I444 Left anterior fascicular block: Secondary | ICD-10-CM | POA: Diagnosis not present

## 2015-01-27 DIAGNOSIS — G4733 Obstructive sleep apnea (adult) (pediatric): Secondary | ICD-10-CM | POA: Diagnosis present

## 2015-01-27 DIAGNOSIS — A4151 Sepsis due to Escherichia coli [E. coli]: Secondary | ICD-10-CM | POA: Diagnosis not present

## 2015-01-27 DIAGNOSIS — Z888 Allergy status to other drugs, medicaments and biological substances status: Secondary | ICD-10-CM | POA: Diagnosis not present

## 2015-01-27 DIAGNOSIS — Z1612 Extended spectrum beta lactamase (ESBL) resistance: Secondary | ICD-10-CM | POA: Diagnosis not present

## 2015-01-27 DIAGNOSIS — Z87891 Personal history of nicotine dependence: Secondary | ICD-10-CM | POA: Diagnosis not present

## 2015-01-27 DIAGNOSIS — G9340 Encephalopathy, unspecified: Secondary | ICD-10-CM | POA: Diagnosis not present

## 2015-01-27 DIAGNOSIS — B9629 Other Escherichia coli [E. coli] as the cause of diseases classified elsewhere: Secondary | ICD-10-CM | POA: Diagnosis present

## 2015-01-27 DIAGNOSIS — K219 Gastro-esophageal reflux disease without esophagitis: Secondary | ICD-10-CM | POA: Diagnosis present

## 2015-01-27 DIAGNOSIS — I517 Cardiomegaly: Secondary | ICD-10-CM | POA: Diagnosis not present

## 2015-01-27 DIAGNOSIS — Z452 Encounter for adjustment and management of vascular access device: Secondary | ICD-10-CM | POA: Diagnosis not present

## 2015-01-27 DIAGNOSIS — N2881 Hypertrophy of kidney: Secondary | ICD-10-CM | POA: Diagnosis not present

## 2015-01-27 LAB — CBC AND DIFFERENTIAL
HCT: 26 % — AB (ref 41–53)
HEMOGLOBIN: 8.5 g/dL — AB (ref 13.5–17.5)
Platelets: 347 10*3/uL (ref 150–399)
WBC: 6.3 10^3/mL

## 2015-01-27 LAB — TSH: TSH: 0.68 u[IU]/mL (ref 0.41–5.90)

## 2015-01-27 LAB — POCT INR: INR: 1.2 — AB (ref 0.9–1.1)

## 2015-01-27 LAB — BASIC METABOLIC PANEL
BUN: 13 mg/dL (ref 4–21)
CREATININE: 1 mg/dL (ref 0.6–1.3)
POTASSIUM: 4.3 mmol/L (ref 3.4–5.3)
Sodium: 124 mmol/L — AB (ref 137–147)

## 2015-01-27 LAB — HEPATIC FUNCTION PANEL
ALT: 63 U/L — AB (ref 10–40)
AST: 27 U/L (ref 14–40)

## 2015-02-04 LAB — BASIC METABOLIC PANEL
BUN: 17 mg/dL (ref 4–21)
Creatinine: 0.8 mg/dL (ref 0.6–1.3)
GLUCOSE: 155 mg/dL
Potassium: 3.4 mmol/L (ref 3.4–5.3)
Sodium: 125 mmol/L — AB (ref 137–147)

## 2015-02-04 LAB — CBC AND DIFFERENTIAL
HCT: 22 % — AB (ref 41–53)
Hemoglobin: 7.3 g/dL — AB (ref 13.5–17.5)
Neutrophils Absolute: 11 /uL
Platelets: 513 10*3/uL — AB (ref 150–399)
WBC: 12.1 10^3/mL

## 2015-02-04 LAB — HEPATIC FUNCTION PANEL
ALT: 46 U/L — AB (ref 10–40)
AST: 35 U/L (ref 14–40)
Alkaline Phosphatase: 118 U/L (ref 25–125)
Bilirubin, Total: 0.5 mg/dL

## 2015-02-05 LAB — CBC AND DIFFERENTIAL
HCT: 30 % — AB (ref 41–53)
HEMOGLOBIN: 9.9 g/dL — AB (ref 13.5–17.5)
Neutrophils Absolute: 9 /uL
Platelets: 552 10*3/uL — AB (ref 150–399)
WBC: 13 10*3/mL

## 2015-02-05 LAB — BASIC METABOLIC PANEL
BUN: 16 mg/dL (ref 4–21)
Creatinine: 0.8 mg/dL (ref 0.6–1.3)
GLUCOSE: 99 mg/dL
Potassium: 3.8 mmol/L (ref 3.4–5.3)
Sodium: 131 mmol/L — AB (ref 137–147)

## 2015-02-05 LAB — HEPATIC FUNCTION PANEL
ALT: 72 U/L — AB (ref 10–40)
AST: 52 U/L — AB (ref 14–40)
Alkaline Phosphatase: 134 U/L — AB (ref 25–125)
BILIRUBIN, TOTAL: 0.5 mg/dL

## 2015-02-06 LAB — HEPATIC FUNCTION PANEL
ALT: 66 U/L — AB (ref 10–40)
AST: 33 U/L (ref 14–40)
Alkaline Phosphatase: 127 U/L — AB (ref 25–125)
Bilirubin, Total: 0.5 mg/dL

## 2015-02-06 LAB — CBC AND DIFFERENTIAL
HCT: 30 % — AB (ref 41–53)
HEMOGLOBIN: 9.9 g/dL — AB (ref 13.5–17.5)
Platelets: 595 10*3/uL — AB (ref 150–399)
WBC: 13.8 10^3/mL

## 2015-02-06 LAB — BASIC METABOLIC PANEL
BUN: 16 mg/dL (ref 4–21)
Creatinine: 0.7 mg/dL (ref 0.6–1.3)
Glucose: 122 mg/dL
POTASSIUM: 3.8 mmol/L (ref 3.4–5.3)
Sodium: 133 mmol/L — AB (ref 137–147)

## 2015-02-07 LAB — BASIC METABOLIC PANEL
BUN: 15 mg/dL (ref 4–21)
Creatinine: 0.7 mg/dL (ref 0.6–1.3)
Glucose: 176 mg/dL
POTASSIUM: 3.6 mmol/L (ref 3.4–5.3)
SODIUM: 133 mmol/L — AB (ref 137–147)

## 2015-02-07 LAB — CBC AND DIFFERENTIAL
HCT: 29 % — AB (ref 41–53)
Hemoglobin: 9.8 g/dL — AB (ref 13.5–17.5)
PLATELETS: 639 10*3/uL — AB (ref 150–399)
WBC: 13.3 10^3/mL

## 2015-02-07 LAB — HEPATIC FUNCTION PANEL
ALT: 57 U/L — AB (ref 10–40)
AST: 28 U/L (ref 14–40)
Alkaline Phosphatase: 109 U/L (ref 25–125)
Bilirubin, Total: 0.3 mg/dL

## 2015-02-11 DIAGNOSIS — C9200 Acute myeloblastic leukemia, not having achieved remission: Secondary | ICD-10-CM | POA: Diagnosis not present

## 2015-02-11 DIAGNOSIS — F4310 Post-traumatic stress disorder, unspecified: Secondary | ICD-10-CM | POA: Diagnosis not present

## 2015-02-11 DIAGNOSIS — E119 Type 2 diabetes mellitus without complications: Secondary | ICD-10-CM | POA: Diagnosis not present

## 2015-02-11 DIAGNOSIS — R1310 Dysphagia, unspecified: Secondary | ICD-10-CM | POA: Diagnosis not present

## 2015-02-11 DIAGNOSIS — I10 Essential (primary) hypertension: Secondary | ICD-10-CM | POA: Diagnosis not present

## 2015-02-11 DIAGNOSIS — I5040 Unspecified combined systolic (congestive) and diastolic (congestive) heart failure: Secondary | ICD-10-CM | POA: Diagnosis not present

## 2015-02-12 ENCOUNTER — Ambulatory Visit (INDEPENDENT_AMBULATORY_CARE_PROVIDER_SITE_OTHER): Payer: Medicare Other | Admitting: Internal Medicine

## 2015-02-12 ENCOUNTER — Encounter: Payer: Self-pay | Admitting: Internal Medicine

## 2015-02-12 ENCOUNTER — Telehealth: Payer: Self-pay

## 2015-02-12 VITALS — BP 120/68 | HR 103 | Temp 97.8°F | Ht 71.0 in | Wt 215.5 lb

## 2015-02-12 DIAGNOSIS — E119 Type 2 diabetes mellitus without complications: Secondary | ICD-10-CM | POA: Diagnosis not present

## 2015-02-12 DIAGNOSIS — C9200 Acute myeloblastic leukemia, not having achieved remission: Secondary | ICD-10-CM | POA: Diagnosis not present

## 2015-02-12 MED ORDER — GLUCOSE BLOOD VI STRP: ORAL_STRIP | Status: DC

## 2015-02-12 MED ORDER — SERTRALINE HCL 100 MG PO TABS: 200.0000 mg | ORAL_TABLET | Freq: Every day | ORAL | Status: DC

## 2015-02-12 MED ORDER — METFORMIN HCL 850 MG PO TABS: 850.0000 mg | ORAL_TABLET | Freq: Two times a day (BID) | ORAL | Status: DC

## 2015-02-12 MED ORDER — BUPROPION HCL ER (SR) 150 MG PO TB12: 150.0000 mg | ORAL_TABLET | Freq: Two times a day (BID) | ORAL | Status: DC

## 2015-02-12 MED ORDER — PRAVASTATIN SODIUM 20 MG PO TABS: 40.0000 mg | ORAL_TABLET | Freq: Every day | ORAL | Status: DC

## 2015-02-12 NOTE — Patient Instructions (Signed)
    Check your blood sugar  once or twice a day  Check your blood sugar  at different times of the day  GOALS: Fasting before a meal 70- 130 2 hours after a meal less than 180 At bedtime 90-150 Call if consistently not at goal

## 2015-02-12 NOTE — Progress Notes (Signed)
Pre visit review using our clinic review tool, if applicable. No additional management support is needed unless otherwise documented below in the visit note. 

## 2015-02-12 NOTE — Progress Notes (Signed)
Subjective:    Patient ID: William Stokes, male    DOB: July 07, 1946, 69 y.o.   MRN: [id number]  DOS:  02/12/2015 Type of visit - description : Hospital follow-up, here with his wife and son Interval history:  Admitted 0 12/27/2014 The patient was extremely ill, he went downstairs to the ER  was emergently  intubated, the diagnosis was acute myelogenous leukemia, he was transferred to [facility]. He was emergently treated, had 3 ICU stays related to respiratory failure, fluid overload and eventually pneumonia. After he was stabalized, he received aggressive chemotherapy, had 2 bone marrow bx, the last came back very good and he has been declared in remission.  Last labs: 02/07/2012: Sodium 133, creatinine 0.7, AST 28, ALT 57 which is elevated but decreasing compared to previous values. White count 13.8, hemoglobin 9.8, platelets 639  Review of Systems   Currently doing well. Appetite is coming back No fever chills Still weak doing home physical therapy No nausea, vomiting, diarrhea No cough or sputum production Mood is currently okay, he did have some depression in the last few weeks   Past Medical History  Diagnosis Date  . Osteoarthritis     left knee  . Hyperlipidemia     was on Crestor but has been off for a couple of months  . Glaucoma   . Primary male hypogonadism   . Adenomatous colon polyp   . LAFB (left anterior fascicular block)     per baseline EKG 2009  . Anxiety   . PTSD (post-traumatic stress disorder)     went to [place], has chronic diff. sleeping  . Erectile dysfunction   . Hypertension 3/11    takes Lisinopril daily  . Sleep apnea, obstructive     uses CPAP and last sleep study in epic from 2011  . Eczema     legs and hands  . GERD (gastroesophageal reflux disease)     takes OMeprazole daily  . Diabetes mellitus 2/09    takes Januvia-started 2wks ago  . Cataracts, bilateral     immature  . Insomnia   . Sleep apnea     Past Surgical  History  Procedure Laterality Date  . Knee arthroscopy  1999    meniscal torn L, Dr.Dalldorf  . Colonoscopy    . Quadriceps [place]  [place]    [place]  . Hernia repair    . Total knee arthroplasty  03/28/2012    LEFT TOTAL KNEE ARTHROPLASTY;  Surgeon: Hessie Dibble, MD;  Location: [place];  Service: Orthopedics;  Laterality: Left;  with revision tibia  . Total knee arthroplasty  08/03/2012    Procedure: TOTAL KNEE ARTHROPLASTY;  Surgeon: Hessie Dibble, MD;  Location: [place];  Service: Orthopedics;  Laterality: Right;    History   Social History  . Marital Status: Married    Spouse Name: N/A  . Number of Children: 2  . Years of Education: N/A   Occupational History  . Retired from [employer]   . RETIRED    Social History Main Topics  . Smoking status: Former Smoker -- 2.00 packs/day for 22 years    Types: Cigarettes    Quit date: 08/17/1991  . Smokeless tobacco: Never Used  . Alcohol Use: No  . Drug Use: No  . Sexual Activity: Yes   Other Topics Concern  . Not on file   Social History Narrative   Born in [place], Parents from [place]   In  [place] x years                 Medication List       This list is accurate as of: 02/12/15 11:59 PM.  Always use your most recent med list.               allopurinol 300 MG tablet  Commonly known as:  ZYLOPRIM  Take 1 tablet (300 mg total) by mouth daily.     buPROPion 150 MG 12 hr tablet  Commonly known as:  WELLBUTRIN SR  Take 1 tablet (150 mg total) by mouth 2 (two) times daily.     freestyle lancets  Check once daily. Dx: 250.00     glucose blood test strip  Commonly known as:  FREESTYLE LITE  Check blood sugar twice daily.     ibuprofen 800 MG tablet  Commonly known as:  ADVIL,MOTRIN  Take 800 mg by mouth 2 (two) times daily.     metFORMIN 850 MG tablet  Commonly known as:  GLUCOPHAGE  Take 1 tablet (850 mg total) by mouth 2 (two) times daily with a meal.     omeprazole 40 MG capsule    Commonly known as:  PRILOSEC  Take 1 capsule (40 mg total) by mouth daily.     pravastatin 20 MG tablet  Commonly known as:  PRAVACHOL  Take 2 tablets (40 mg total) by mouth daily.     sertraline 100 MG tablet  Commonly known as:  ZOLOFT  Take 2 tablets (200 mg total) by mouth daily.     tadalafil 20 MG tablet  Commonly known as:  CIALIS  Take 1 tablet (20 mg total) by mouth every other day as needed. For E. D.           Objective:   Physical Exam BP 120/68 mmHg  Pulse 103  Temp(Src) 97.8 F (36.6 C) (Oral)  Ht 5\' 11"  (1.803 m)  Wt 215 lb 8 oz (97.75 kg)  BMI 30.07 kg/m2  SpO2 96% General:   Well developed, well nourished, sitting in a motorized scooter, in no distress  HEENT:  Normocephalic . Face symmetric, atraumatic Lungs:  CTA B Normal respiratory effort, no intercostal retractions, no accessory muscle use. Heart: RRR,  no murmur.  No pretibial edema bilaterally  Skin: Not pale. Not jaundice Neurologic:  alert & oriented X3.  Speech normal, gait appropriate for age and unassisted Psych--  Cognition and judgment appear intact.  Cooperative with normal attention span and concentration.  Behavior appropriate. No anxious or depressed appearing. she seems to be in good spirits         Assessment & Plan:

## 2015-02-12 NOTE — Telephone Encounter (Addendum)
Letter printed and given to Pt at hospital F/U appt today.

## 2015-02-12 NOTE — Telephone Encounter (Signed)
-----   Message from Colon Branch, MD sent at 02/12/2015 12:20 PM EDT ----- To whom it may concern Mr. William Stokes no was admitted to [facility] on 12/27/2014 for 41 days, the  diagnoses was acute leukemia. He was very ill, 3 stays in the ICU, received chemotherapy, eventually improve and was sent home after 41 days. We will enclose information regards this admission.

## 2015-02-13 ENCOUNTER — Telehealth: Payer: Self-pay | Admitting: Internal Medicine

## 2015-02-13 NOTE — Telephone Encounter (Signed)
LMOM for Christine giving verbal orders for PT as instructed. Informed her to return call if she has any further questions.

## 2015-02-13 NOTE — Assessment & Plan Note (Signed)
acute myelogenous leukemia Status post admission for 41 days, status post chemotherapy, currently on remission. Plan is to get a Port-A-Cath this week because he needs a booster chemotherapy 3 weeks from today. Follow-up at [facility] hematology. Will also see cardiology at [facility] due to concerns off effect of chemotherapy of his heart. Needs a letter for the [place] ---> provided

## 2015-02-13 NOTE — Telephone Encounter (Signed)
Caller name: Altha Harm, PT with [facility] Can be reached: [phone]  Reason for call: Please call to give verbal orders for: PT following hospital discharge. 3 x week for 2 weeks, 2x week for 7 weeks OT evaluation approval

## 2015-02-13 NOTE — Assessment & Plan Note (Signed)
No recent CBGs, will refill his strips. Per chart review, lately CBGs have been around 120-170. He is on metformin, creatinine satisfactory. Plan: Continue with metformin, monitor ambulatory CBGs, follow-up in 3 months

## 2015-02-14 DIAGNOSIS — Z7409 Other reduced mobility: Secondary | ICD-10-CM | POA: Diagnosis not present

## 2015-02-14 DIAGNOSIS — Z79899 Other long term (current) drug therapy: Secondary | ICD-10-CM | POA: Diagnosis not present

## 2015-02-14 DIAGNOSIS — R5381 Other malaise: Secondary | ICD-10-CM | POA: Diagnosis not present

## 2015-02-14 DIAGNOSIS — Z452 Encounter for adjustment and management of vascular access device: Secondary | ICD-10-CM | POA: Diagnosis not present

## 2015-02-14 DIAGNOSIS — Z7982 Long term (current) use of aspirin: Secondary | ICD-10-CM | POA: Diagnosis not present

## 2015-02-14 DIAGNOSIS — C9201 Acute myeloblastic leukemia, in remission: Secondary | ICD-10-CM | POA: Diagnosis not present

## 2015-02-14 DIAGNOSIS — C9200 Acute myeloblastic leukemia, not having achieved remission: Secondary | ICD-10-CM | POA: Diagnosis not present

## 2015-02-14 DIAGNOSIS — I10 Essential (primary) hypertension: Secondary | ICD-10-CM | POA: Diagnosis not present

## 2015-02-14 DIAGNOSIS — E119 Type 2 diabetes mellitus without complications: Secondary | ICD-10-CM | POA: Diagnosis not present

## 2015-02-14 LAB — HEPATIC FUNCTION PANEL
ALT: 32 U/L (ref 10–40)
AST: 20 U/L (ref 14–40)
Alkaline Phosphatase: 109 U/L (ref 25–125)
Bilirubin, Total: 0.5 mg/dL

## 2015-02-14 LAB — BASIC METABOLIC PANEL
BUN: 12 mg/dL (ref 4–21)
Creatinine: 0.8 mg/dL (ref 0.6–1.3)
Glucose: 117 mg/dL
POTASSIUM: 3.7 mmol/L (ref 3.4–5.3)
Sodium: 136 mmol/L — AB (ref 137–147)

## 2015-02-14 LAB — CBC AND DIFFERENTIAL
HCT: 30 % — AB (ref 41–53)
Hemoglobin: 9.9 g/dL — AB (ref 13.5–17.5)
Platelets: 584 10*3/uL — AB (ref 150–399)
WBC: 10.2 10^3/mL

## 2015-02-14 LAB — PROTIME-INR: Protime: 27.7 seconds — AB (ref 10.0–13.8)

## 2015-02-14 LAB — POCT INR: INR: 1.1 (ref 0.9–1.1)

## 2015-02-17 DIAGNOSIS — R1310 Dysphagia, unspecified: Secondary | ICD-10-CM | POA: Diagnosis not present

## 2015-02-17 DIAGNOSIS — I5040 Unspecified combined systolic (congestive) and diastolic (congestive) heart failure: Secondary | ICD-10-CM | POA: Diagnosis not present

## 2015-02-17 DIAGNOSIS — I10 Essential (primary) hypertension: Secondary | ICD-10-CM | POA: Diagnosis not present

## 2015-02-17 DIAGNOSIS — E119 Type 2 diabetes mellitus without complications: Secondary | ICD-10-CM | POA: Diagnosis not present

## 2015-02-17 DIAGNOSIS — F4310 Post-traumatic stress disorder, unspecified: Secondary | ICD-10-CM | POA: Diagnosis not present

## 2015-02-17 DIAGNOSIS — C9200 Acute myeloblastic leukemia, not having achieved remission: Secondary | ICD-10-CM | POA: Diagnosis not present

## 2015-02-19 DIAGNOSIS — E119 Type 2 diabetes mellitus without complications: Secondary | ICD-10-CM | POA: Diagnosis not present

## 2015-02-19 DIAGNOSIS — R1310 Dysphagia, unspecified: Secondary | ICD-10-CM | POA: Diagnosis not present

## 2015-02-19 DIAGNOSIS — I10 Essential (primary) hypertension: Secondary | ICD-10-CM | POA: Diagnosis not present

## 2015-02-19 DIAGNOSIS — I5040 Unspecified combined systolic (congestive) and diastolic (congestive) heart failure: Secondary | ICD-10-CM | POA: Diagnosis not present

## 2015-02-19 DIAGNOSIS — F4310 Post-traumatic stress disorder, unspecified: Secondary | ICD-10-CM | POA: Diagnosis not present

## 2015-02-19 DIAGNOSIS — C9200 Acute myeloblastic leukemia, not having achieved remission: Secondary | ICD-10-CM | POA: Diagnosis not present

## 2015-02-19 NOTE — Telephone Encounter (Signed)
°  Caller name: Altha Harm, PT with [facility]  Can be reached: [phone]   Requesting OT to evaluate patient. Please call with verbal order.

## 2015-02-19 NOTE — Telephone Encounter (Signed)
Spoke with Altha Harm, verbal orders given for OT to evaluate Pt.

## 2015-02-21 ENCOUNTER — Ambulatory Visit: Payer: Medicare Other | Admitting: Pulmonary Disease

## 2015-02-21 DIAGNOSIS — I10 Essential (primary) hypertension: Secondary | ICD-10-CM | POA: Diagnosis not present

## 2015-02-21 DIAGNOSIS — I5040 Unspecified combined systolic (congestive) and diastolic (congestive) heart failure: Secondary | ICD-10-CM | POA: Diagnosis not present

## 2015-02-21 DIAGNOSIS — R1310 Dysphagia, unspecified: Secondary | ICD-10-CM | POA: Diagnosis not present

## 2015-02-21 DIAGNOSIS — C9200 Acute myeloblastic leukemia, not having achieved remission: Secondary | ICD-10-CM | POA: Diagnosis not present

## 2015-02-21 DIAGNOSIS — F4310 Post-traumatic stress disorder, unspecified: Secondary | ICD-10-CM | POA: Diagnosis not present

## 2015-02-21 DIAGNOSIS — E119 Type 2 diabetes mellitus without complications: Secondary | ICD-10-CM | POA: Diagnosis not present

## 2015-02-24 DIAGNOSIS — R1310 Dysphagia, unspecified: Secondary | ICD-10-CM | POA: Diagnosis not present

## 2015-02-24 DIAGNOSIS — E119 Type 2 diabetes mellitus without complications: Secondary | ICD-10-CM | POA: Diagnosis not present

## 2015-02-24 DIAGNOSIS — C9200 Acute myeloblastic leukemia, not having achieved remission: Secondary | ICD-10-CM | POA: Diagnosis not present

## 2015-02-24 DIAGNOSIS — F4310 Post-traumatic stress disorder, unspecified: Secondary | ICD-10-CM | POA: Diagnosis not present

## 2015-02-24 DIAGNOSIS — I10 Essential (primary) hypertension: Secondary | ICD-10-CM | POA: Diagnosis not present

## 2015-02-24 DIAGNOSIS — I5040 Unspecified combined systolic (congestive) and diastolic (congestive) heart failure: Secondary | ICD-10-CM | POA: Diagnosis not present

## 2015-02-26 DIAGNOSIS — E119 Type 2 diabetes mellitus without complications: Secondary | ICD-10-CM | POA: Diagnosis not present

## 2015-02-26 DIAGNOSIS — I5040 Unspecified combined systolic (congestive) and diastolic (congestive) heart failure: Secondary | ICD-10-CM | POA: Diagnosis not present

## 2015-02-26 DIAGNOSIS — I10 Essential (primary) hypertension: Secondary | ICD-10-CM | POA: Diagnosis not present

## 2015-02-26 DIAGNOSIS — F4310 Post-traumatic stress disorder, unspecified: Secondary | ICD-10-CM | POA: Diagnosis not present

## 2015-02-26 DIAGNOSIS — C9200 Acute myeloblastic leukemia, not having achieved remission: Secondary | ICD-10-CM | POA: Diagnosis not present

## 2015-02-26 DIAGNOSIS — R1310 Dysphagia, unspecified: Secondary | ICD-10-CM | POA: Diagnosis not present

## 2015-02-28 DIAGNOSIS — C9201 Acute myeloblastic leukemia, in remission: Secondary | ICD-10-CM | POA: Diagnosis not present

## 2015-02-28 DIAGNOSIS — Z79899 Other long term (current) drug therapy: Secondary | ICD-10-CM | POA: Diagnosis not present

## 2015-02-28 DIAGNOSIS — F4310 Post-traumatic stress disorder, unspecified: Secondary | ICD-10-CM | POA: Diagnosis not present

## 2015-02-28 DIAGNOSIS — I5040 Unspecified combined systolic (congestive) and diastolic (congestive) heart failure: Secondary | ICD-10-CM | POA: Diagnosis not present

## 2015-02-28 DIAGNOSIS — E119 Type 2 diabetes mellitus without complications: Secondary | ICD-10-CM | POA: Diagnosis not present

## 2015-02-28 DIAGNOSIS — E785 Hyperlipidemia, unspecified: Secondary | ICD-10-CM | POA: Diagnosis not present

## 2015-02-28 DIAGNOSIS — Z9221 Personal history of antineoplastic chemotherapy: Secondary | ICD-10-CM | POA: Diagnosis not present

## 2015-02-28 DIAGNOSIS — I10 Essential (primary) hypertension: Secondary | ICD-10-CM | POA: Diagnosis not present

## 2015-02-28 DIAGNOSIS — R1310 Dysphagia, unspecified: Secondary | ICD-10-CM | POA: Diagnosis not present

## 2015-02-28 DIAGNOSIS — C9200 Acute myeloblastic leukemia, not having achieved remission: Secondary | ICD-10-CM | POA: Diagnosis not present

## 2015-02-28 LAB — BASIC METABOLIC PANEL
BUN: 15 mg/dL (ref 4–21)
CREATININE: 0.9 mg/dL (ref 0.6–1.3)
GLUCOSE: 94 mg/dL
POTASSIUM: 3.6 mmol/L (ref 3.4–5.3)
Sodium: 136 mmol/L — AB (ref 137–147)

## 2015-02-28 LAB — CBC AND DIFFERENTIAL
HCT: 31 % — AB (ref 41–53)
HEMOGLOBIN: 10.1 g/dL — AB (ref 13.5–17.5)
Platelets: 456 10*3/uL — AB (ref 150–399)
WBC: 12.7 10^3/mL

## 2015-02-28 LAB — HEPATIC FUNCTION PANEL
ALT: 16 U/L (ref 10–40)
AST: 19 U/L (ref 14–40)
Alkaline Phosphatase: 99 U/L (ref 25–125)
Bilirubin, Total: 0.5 mg/dL

## 2015-03-04 ENCOUNTER — Telehealth: Payer: Self-pay | Admitting: Internal Medicine

## 2015-03-04 DIAGNOSIS — R1310 Dysphagia, unspecified: Secondary | ICD-10-CM | POA: Diagnosis not present

## 2015-03-04 DIAGNOSIS — C9200 Acute myeloblastic leukemia, not having achieved remission: Secondary | ICD-10-CM | POA: Diagnosis not present

## 2015-03-04 DIAGNOSIS — F4310 Post-traumatic stress disorder, unspecified: Secondary | ICD-10-CM | POA: Diagnosis not present

## 2015-03-04 DIAGNOSIS — E119 Type 2 diabetes mellitus without complications: Secondary | ICD-10-CM | POA: Diagnosis not present

## 2015-03-04 DIAGNOSIS — I10 Essential (primary) hypertension: Secondary | ICD-10-CM | POA: Diagnosis not present

## 2015-03-04 DIAGNOSIS — I5040 Unspecified combined systolic (congestive) and diastolic (congestive) heart failure: Secondary | ICD-10-CM | POA: Diagnosis not present

## 2015-03-04 NOTE — Telephone Encounter (Signed)
Please advise 

## 2015-03-04 NOTE — Telephone Encounter (Signed)
Spoke with William Stokes, informed her of Dr. Larose Kells recommendations. William Stokes verbalized understanding.

## 2015-03-04 NOTE — Telephone Encounter (Signed)
Caller name: Kennard  Relation to pt: Physical Therapy  Call back number: [phone]    Reason for call:  PT wanted to inform MD of medication interaction sertraline (ZOLOFT) 100 MG tablet and ibuprofen (ADVIL,MOTRIN) 800 MG tablet. PT requesting a follow up call

## 2015-03-04 NOTE — Telephone Encounter (Signed)
They can interact, recommend to continue with sertraline and minimize the use of Advil to one time a day. He can take Tylenol as an alternative. Call if sertraline is not working for him or if he has any problems with bleeding.

## 2015-03-05 ENCOUNTER — Telehealth: Payer: Self-pay | Admitting: Internal Medicine

## 2015-03-05 NOTE — Telephone Encounter (Signed)
pre visit letter mailed 03/04/15

## 2015-03-06 DIAGNOSIS — C9200 Acute myeloblastic leukemia, not having achieved remission: Secondary | ICD-10-CM | POA: Diagnosis not present

## 2015-03-06 DIAGNOSIS — I5040 Unspecified combined systolic (congestive) and diastolic (congestive) heart failure: Secondary | ICD-10-CM | POA: Diagnosis not present

## 2015-03-06 DIAGNOSIS — E119 Type 2 diabetes mellitus without complications: Secondary | ICD-10-CM | POA: Diagnosis not present

## 2015-03-06 DIAGNOSIS — R1310 Dysphagia, unspecified: Secondary | ICD-10-CM | POA: Diagnosis not present

## 2015-03-06 DIAGNOSIS — F4310 Post-traumatic stress disorder, unspecified: Secondary | ICD-10-CM | POA: Diagnosis not present

## 2015-03-06 DIAGNOSIS — I10 Essential (primary) hypertension: Secondary | ICD-10-CM | POA: Diagnosis not present

## 2015-03-11 DIAGNOSIS — Z5111 Encounter for antineoplastic chemotherapy: Secondary | ICD-10-CM | POA: Diagnosis not present

## 2015-03-11 DIAGNOSIS — R1110 Vomiting, unspecified: Secondary | ICD-10-CM | POA: Diagnosis not present

## 2015-03-11 DIAGNOSIS — F4310 Post-traumatic stress disorder, unspecified: Secondary | ICD-10-CM | POA: Diagnosis present

## 2015-03-11 DIAGNOSIS — D649 Anemia, unspecified: Secondary | ICD-10-CM | POA: Diagnosis not present

## 2015-03-11 DIAGNOSIS — G4733 Obstructive sleep apnea (adult) (pediatric): Secondary | ICD-10-CM | POA: Diagnosis present

## 2015-03-11 DIAGNOSIS — Z791 Long term (current) use of non-steroidal anti-inflammatories (NSAID): Secondary | ICD-10-CM | POA: Diagnosis not present

## 2015-03-11 DIAGNOSIS — F419 Anxiety disorder, unspecified: Secondary | ICD-10-CM | POA: Diagnosis present

## 2015-03-11 DIAGNOSIS — L270 Generalized skin eruption due to drugs and medicaments taken internally: Secondary | ICD-10-CM | POA: Diagnosis present

## 2015-03-11 DIAGNOSIS — K219 Gastro-esophageal reflux disease without esophagitis: Secondary | ICD-10-CM | POA: Diagnosis present

## 2015-03-11 DIAGNOSIS — Z8249 Family history of ischemic heart disease and other diseases of the circulatory system: Secondary | ICD-10-CM | POA: Diagnosis not present

## 2015-03-11 DIAGNOSIS — E119 Type 2 diabetes mellitus without complications: Secondary | ICD-10-CM | POA: Diagnosis present

## 2015-03-11 DIAGNOSIS — I10 Essential (primary) hypertension: Secondary | ICD-10-CM | POA: Diagnosis present

## 2015-03-11 DIAGNOSIS — Z87891 Personal history of nicotine dependence: Secondary | ICD-10-CM | POA: Diagnosis not present

## 2015-03-11 DIAGNOSIS — Z7982 Long term (current) use of aspirin: Secondary | ICD-10-CM | POA: Diagnosis not present

## 2015-03-11 DIAGNOSIS — T451X5A Adverse effect of antineoplastic and immunosuppressive drugs, initial encounter: Secondary | ICD-10-CM | POA: Diagnosis present

## 2015-03-11 DIAGNOSIS — C9201 Acute myeloblastic leukemia, in remission: Secondary | ICD-10-CM | POA: Diagnosis present

## 2015-03-11 DIAGNOSIS — D6481 Anemia due to antineoplastic chemotherapy: Secondary | ICD-10-CM | POA: Diagnosis present

## 2015-03-11 DIAGNOSIS — E785 Hyperlipidemia, unspecified: Secondary | ICD-10-CM | POA: Diagnosis present

## 2015-03-11 DIAGNOSIS — I4891 Unspecified atrial fibrillation: Secondary | ICD-10-CM | POA: Diagnosis present

## 2015-03-11 LAB — BASIC METABOLIC PANEL
BUN: 17 mg/dL (ref 4–21)
Creatinine: 1 mg/dL (ref 0.6–1.3)
Glucose: 170 mg/dL
Potassium: 3.6 mmol/L (ref 3.4–5.3)
SODIUM: 139 mmol/L (ref 137–147)

## 2015-03-11 LAB — CBC AND DIFFERENTIAL
HEMATOCRIT: 32 % — AB (ref 41–53)
Hemoglobin: 10.3 g/dL — AB (ref 13.5–17.5)
PLATELETS: 333 10*3/uL (ref 150–399)
WBC: 9.5 10^3/mL

## 2015-03-11 LAB — HEPATIC FUNCTION PANEL
ALK PHOS: 75 U/L (ref 25–125)
ALT: 13 U/L (ref 10–40)
AST: 18 U/L (ref 14–40)
Bilirubin, Total: 0.3 mg/dL

## 2015-03-18 DIAGNOSIS — C9201 Acute myeloblastic leukemia, in remission: Secondary | ICD-10-CM | POA: Diagnosis not present

## 2015-03-20 ENCOUNTER — Telehealth: Payer: Self-pay | Admitting: Internal Medicine

## 2015-03-20 DIAGNOSIS — F4310 Post-traumatic stress disorder, unspecified: Secondary | ICD-10-CM | POA: Diagnosis not present

## 2015-03-20 DIAGNOSIS — I10 Essential (primary) hypertension: Secondary | ICD-10-CM | POA: Diagnosis not present

## 2015-03-20 DIAGNOSIS — C9200 Acute myeloblastic leukemia, not having achieved remission: Secondary | ICD-10-CM | POA: Diagnosis not present

## 2015-03-20 DIAGNOSIS — I5040 Unspecified combined systolic (congestive) and diastolic (congestive) heart failure: Secondary | ICD-10-CM | POA: Diagnosis not present

## 2015-03-20 DIAGNOSIS — R1310 Dysphagia, unspecified: Secondary | ICD-10-CM | POA: Diagnosis not present

## 2015-03-20 DIAGNOSIS — E119 Type 2 diabetes mellitus without complications: Secondary | ICD-10-CM | POA: Diagnosis not present

## 2015-03-20 NOTE — Telephone Encounter (Signed)
Caller name:[facility] Relation to YK:ZLDJ nurse Call back [phone] Pharmacy:  Reason for call: : Need verbal order to resume physical therapy 2x 1 week and 1x 1 week.

## 2015-03-20 NOTE — Telephone Encounter (Signed)
Spoke with William Stokes, verbal orders given to resume PT.

## 2015-03-21 DIAGNOSIS — Z95828 Presence of other vascular implants and grafts: Secondary | ICD-10-CM | POA: Diagnosis not present

## 2015-03-21 DIAGNOSIS — I10 Essential (primary) hypertension: Secondary | ICD-10-CM | POA: Diagnosis not present

## 2015-03-21 DIAGNOSIS — I4891 Unspecified atrial fibrillation: Secondary | ICD-10-CM | POA: Diagnosis not present

## 2015-03-21 DIAGNOSIS — E119 Type 2 diabetes mellitus without complications: Secondary | ICD-10-CM | POA: Diagnosis not present

## 2015-03-21 DIAGNOSIS — C9201 Acute myeloblastic leukemia, in remission: Secondary | ICD-10-CM | POA: Diagnosis not present

## 2015-03-21 DIAGNOSIS — F4310 Post-traumatic stress disorder, unspecified: Secondary | ICD-10-CM | POA: Diagnosis not present

## 2015-03-21 DIAGNOSIS — Z9221 Personal history of antineoplastic chemotherapy: Secondary | ICD-10-CM | POA: Diagnosis not present

## 2015-03-21 DIAGNOSIS — Z79899 Other long term (current) drug therapy: Secondary | ICD-10-CM | POA: Diagnosis not present

## 2015-03-21 DIAGNOSIS — I5040 Unspecified combined systolic (congestive) and diastolic (congestive) heart failure: Secondary | ICD-10-CM | POA: Diagnosis not present

## 2015-03-21 DIAGNOSIS — E785 Hyperlipidemia, unspecified: Secondary | ICD-10-CM | POA: Diagnosis not present

## 2015-03-24 DIAGNOSIS — R1310 Dysphagia, unspecified: Secondary | ICD-10-CM | POA: Diagnosis not present

## 2015-03-24 DIAGNOSIS — C9200 Acute myeloblastic leukemia, not having achieved remission: Secondary | ICD-10-CM | POA: Diagnosis not present

## 2015-03-24 DIAGNOSIS — I10 Essential (primary) hypertension: Secondary | ICD-10-CM | POA: Diagnosis not present

## 2015-03-24 DIAGNOSIS — F4310 Post-traumatic stress disorder, unspecified: Secondary | ICD-10-CM | POA: Diagnosis not present

## 2015-03-24 DIAGNOSIS — E119 Type 2 diabetes mellitus without complications: Secondary | ICD-10-CM | POA: Diagnosis not present

## 2015-03-24 DIAGNOSIS — I5040 Unspecified combined systolic (congestive) and diastolic (congestive) heart failure: Secondary | ICD-10-CM | POA: Diagnosis not present

## 2015-03-25 ENCOUNTER — Encounter: Payer: Medicare Other | Admitting: Internal Medicine

## 2015-03-25 DIAGNOSIS — I5040 Unspecified combined systolic (congestive) and diastolic (congestive) heart failure: Secondary | ICD-10-CM | POA: Diagnosis not present

## 2015-03-25 DIAGNOSIS — M545 Low back pain: Secondary | ICD-10-CM | POA: Diagnosis not present

## 2015-03-25 DIAGNOSIS — E785 Hyperlipidemia, unspecified: Secondary | ICD-10-CM | POA: Diagnosis not present

## 2015-03-25 DIAGNOSIS — C9201 Acute myeloblastic leukemia, in remission: Secondary | ICD-10-CM | POA: Diagnosis not present

## 2015-03-25 DIAGNOSIS — K5900 Constipation, unspecified: Secondary | ICD-10-CM | POA: Diagnosis not present

## 2015-03-25 DIAGNOSIS — Z79899 Other long term (current) drug therapy: Secondary | ICD-10-CM | POA: Diagnosis not present

## 2015-03-25 DIAGNOSIS — F419 Anxiety disorder, unspecified: Secondary | ICD-10-CM | POA: Diagnosis not present

## 2015-03-25 DIAGNOSIS — I10 Essential (primary) hypertension: Secondary | ICD-10-CM | POA: Diagnosis not present

## 2015-03-25 DIAGNOSIS — Z7982 Long term (current) use of aspirin: Secondary | ICD-10-CM | POA: Diagnosis not present

## 2015-03-25 DIAGNOSIS — Z9221 Personal history of antineoplastic chemotherapy: Secondary | ICD-10-CM | POA: Diagnosis not present

## 2015-03-25 DIAGNOSIS — E119 Type 2 diabetes mellitus without complications: Secondary | ICD-10-CM | POA: Diagnosis not present

## 2015-03-25 LAB — CBC AND DIFFERENTIAL
HCT: 26 % — AB (ref 41–53)
Hemoglobin: 8.4 g/dL — AB (ref 13.5–17.5)
Platelets: 3 10*3/uL — AB (ref 150–399)
WBC: 2.9 10*3/mL

## 2015-03-25 LAB — HEPATIC FUNCTION PANEL
ALT: 13 U/L (ref 10–40)
AST: 19 U/L (ref 14–40)
Alkaline Phosphatase: 83 U/L (ref 25–125)
Bilirubin, Total: 0.8 mg/dL

## 2015-03-25 LAB — BASIC METABOLIC PANEL
BUN: 20 mg/dL (ref 4–21)
CREATININE: 1.1 mg/dL (ref 0.6–1.3)
GLUCOSE: 139 mg/dL
POTASSIUM: 3.8 mmol/L (ref 3.4–5.3)
SODIUM: 135 mmol/L — AB (ref 137–147)

## 2015-03-26 ENCOUNTER — Other Ambulatory Visit: Payer: Self-pay

## 2015-03-26 DIAGNOSIS — I10 Essential (primary) hypertension: Secondary | ICD-10-CM | POA: Diagnosis not present

## 2015-03-26 DIAGNOSIS — I5040 Unspecified combined systolic (congestive) and diastolic (congestive) heart failure: Secondary | ICD-10-CM | POA: Diagnosis not present

## 2015-03-26 DIAGNOSIS — C9200 Acute myeloblastic leukemia, not having achieved remission: Secondary | ICD-10-CM | POA: Diagnosis not present

## 2015-03-26 DIAGNOSIS — R1310 Dysphagia, unspecified: Secondary | ICD-10-CM | POA: Diagnosis not present

## 2015-03-26 DIAGNOSIS — E119 Type 2 diabetes mellitus without complications: Secondary | ICD-10-CM | POA: Diagnosis not present

## 2015-03-26 DIAGNOSIS — F4310 Post-traumatic stress disorder, unspecified: Secondary | ICD-10-CM | POA: Diagnosis not present

## 2015-03-28 DIAGNOSIS — Z79899 Other long term (current) drug therapy: Secondary | ICD-10-CM | POA: Diagnosis not present

## 2015-03-28 DIAGNOSIS — Z7982 Long term (current) use of aspirin: Secondary | ICD-10-CM | POA: Diagnosis not present

## 2015-03-28 DIAGNOSIS — I5040 Unspecified combined systolic (congestive) and diastolic (congestive) heart failure: Secondary | ICD-10-CM | POA: Diagnosis not present

## 2015-03-28 DIAGNOSIS — I10 Essential (primary) hypertension: Secondary | ICD-10-CM | POA: Diagnosis not present

## 2015-03-28 DIAGNOSIS — E785 Hyperlipidemia, unspecified: Secondary | ICD-10-CM | POA: Diagnosis not present

## 2015-03-28 DIAGNOSIS — E119 Type 2 diabetes mellitus without complications: Secondary | ICD-10-CM | POA: Diagnosis not present

## 2015-03-28 DIAGNOSIS — C9201 Acute myeloblastic leukemia, in remission: Secondary | ICD-10-CM | POA: Diagnosis not present

## 2015-03-28 DIAGNOSIS — Z79891 Long term (current) use of opiate analgesic: Secondary | ICD-10-CM | POA: Diagnosis not present

## 2015-03-28 DIAGNOSIS — F4310 Post-traumatic stress disorder, unspecified: Secondary | ICD-10-CM | POA: Diagnosis not present

## 2015-03-28 DIAGNOSIS — M545 Low back pain: Secondary | ICD-10-CM | POA: Diagnosis not present

## 2015-03-28 LAB — CBC AND DIFFERENTIAL
HEMATOCRIT: 28 % — AB (ref 41–53)
HEMOGLOBIN: 9 g/dL — AB (ref 13.5–17.5)
Platelets: 46 10*3/uL — AB (ref 150–399)
WBC: 30.9 10^3/mL

## 2015-03-31 DIAGNOSIS — C9200 Acute myeloblastic leukemia, not having achieved remission: Secondary | ICD-10-CM | POA: Diagnosis not present

## 2015-03-31 DIAGNOSIS — F4310 Post-traumatic stress disorder, unspecified: Secondary | ICD-10-CM | POA: Diagnosis not present

## 2015-03-31 DIAGNOSIS — I5040 Unspecified combined systolic (congestive) and diastolic (congestive) heart failure: Secondary | ICD-10-CM | POA: Diagnosis not present

## 2015-03-31 DIAGNOSIS — E119 Type 2 diabetes mellitus without complications: Secondary | ICD-10-CM | POA: Diagnosis not present

## 2015-03-31 DIAGNOSIS — I10 Essential (primary) hypertension: Secondary | ICD-10-CM | POA: Diagnosis not present

## 2015-03-31 DIAGNOSIS — R1310 Dysphagia, unspecified: Secondary | ICD-10-CM | POA: Diagnosis not present

## 2015-04-01 DIAGNOSIS — Z7982 Long term (current) use of aspirin: Secondary | ICD-10-CM | POA: Diagnosis not present

## 2015-04-01 DIAGNOSIS — I10 Essential (primary) hypertension: Secondary | ICD-10-CM | POA: Diagnosis not present

## 2015-04-01 DIAGNOSIS — Z87891 Personal history of nicotine dependence: Secondary | ICD-10-CM | POA: Diagnosis not present

## 2015-04-01 DIAGNOSIS — E119 Type 2 diabetes mellitus without complications: Secondary | ICD-10-CM | POA: Diagnosis not present

## 2015-04-01 DIAGNOSIS — C9201 Acute myeloblastic leukemia, in remission: Secondary | ICD-10-CM | POA: Diagnosis not present

## 2015-04-01 LAB — BASIC METABOLIC PANEL
BUN: 14 mg/dL (ref 4–21)
CREATININE: 0.9 mg/dL (ref 0.6–1.3)
Glucose: 125 mg/dL
Potassium: 3.9 mmol/L (ref 3.4–5.3)
Sodium: 138 mmol/L (ref 137–147)

## 2015-04-01 LAB — HEPATIC FUNCTION PANEL
ALK PHOS: 87 U/L (ref 25–125)
ALT: 15 U/L (ref 10–40)
AST: 20 U/L (ref 14–40)
Bilirubin, Total: 0.4 mg/dL

## 2015-04-01 LAB — CBC AND DIFFERENTIAL
HEMATOCRIT: 29 % — AB (ref 41–53)
Hemoglobin: 9.5 g/dL — AB (ref 13.5–17.5)
Platelets: 197 10*3/uL (ref 150–399)
WBC: 39.9 10*3/mL

## 2015-04-02 DIAGNOSIS — C9200 Acute myeloblastic leukemia, not having achieved remission: Secondary | ICD-10-CM | POA: Diagnosis not present

## 2015-04-02 DIAGNOSIS — R1310 Dysphagia, unspecified: Secondary | ICD-10-CM | POA: Diagnosis not present

## 2015-04-02 DIAGNOSIS — I10 Essential (primary) hypertension: Secondary | ICD-10-CM | POA: Diagnosis not present

## 2015-04-02 DIAGNOSIS — E119 Type 2 diabetes mellitus without complications: Secondary | ICD-10-CM | POA: Diagnosis not present

## 2015-04-02 DIAGNOSIS — F4310 Post-traumatic stress disorder, unspecified: Secondary | ICD-10-CM | POA: Diagnosis not present

## 2015-04-02 DIAGNOSIS — I5040 Unspecified combined systolic (congestive) and diastolic (congestive) heart failure: Secondary | ICD-10-CM | POA: Diagnosis not present

## 2015-04-09 ENCOUNTER — Telehealth: Payer: Self-pay

## 2015-04-09 ENCOUNTER — Telehealth: Payer: Self-pay | Admitting: Internal Medicine

## 2015-04-09 DIAGNOSIS — R1310 Dysphagia, unspecified: Secondary | ICD-10-CM | POA: Diagnosis not present

## 2015-04-09 DIAGNOSIS — C9200 Acute myeloblastic leukemia, not having achieved remission: Secondary | ICD-10-CM | POA: Diagnosis not present

## 2015-04-09 DIAGNOSIS — F4310 Post-traumatic stress disorder, unspecified: Secondary | ICD-10-CM | POA: Diagnosis not present

## 2015-04-09 DIAGNOSIS — I5040 Unspecified combined systolic (congestive) and diastolic (congestive) heart failure: Secondary | ICD-10-CM | POA: Diagnosis not present

## 2015-04-09 DIAGNOSIS — I10 Essential (primary) hypertension: Secondary | ICD-10-CM | POA: Diagnosis not present

## 2015-04-09 DIAGNOSIS — E119 Type 2 diabetes mellitus without complications: Secondary | ICD-10-CM | POA: Diagnosis not present

## 2015-04-09 NOTE — Telephone Encounter (Signed)
Medication Profile received and faxed back to [facility] at [phone] informing them that Pt is currently under care of oncology at [facility].

## 2015-04-09 NOTE — Telephone Encounter (Signed)
Received fax confirmation on 04/09/2015 at 0843. Medication profile sent for scanning to Pt's chart.

## 2015-04-09 NOTE — Telephone Encounter (Signed)
Caller name:[facility] Relation to PP:[id number] therapist Call back number:[phone] Pharmacy:  Reason for call: pt has been discharged from physical therapy with all goals met.

## 2015-04-09 NOTE — Telephone Encounter (Signed)
Just an FYI

## 2015-04-11 DIAGNOSIS — I5042 Chronic combined systolic (congestive) and diastolic (congestive) heart failure: Secondary | ICD-10-CM | POA: Diagnosis not present

## 2015-04-11 DIAGNOSIS — Z5111 Encounter for antineoplastic chemotherapy: Secondary | ICD-10-CM | POA: Diagnosis not present

## 2015-04-11 DIAGNOSIS — I10 Essential (primary) hypertension: Secondary | ICD-10-CM | POA: Diagnosis not present

## 2015-04-11 DIAGNOSIS — Z7982 Long term (current) use of aspirin: Secondary | ICD-10-CM | POA: Diagnosis not present

## 2015-04-11 DIAGNOSIS — M109 Gout, unspecified: Secondary | ICD-10-CM | POA: Diagnosis not present

## 2015-04-11 DIAGNOSIS — E119 Type 2 diabetes mellitus without complications: Secondary | ICD-10-CM | POA: Diagnosis not present

## 2015-04-11 DIAGNOSIS — M79675 Pain in left toe(s): Secondary | ICD-10-CM | POA: Diagnosis not present

## 2015-04-11 DIAGNOSIS — F4310 Post-traumatic stress disorder, unspecified: Secondary | ICD-10-CM | POA: Diagnosis not present

## 2015-04-11 DIAGNOSIS — I4891 Unspecified atrial fibrillation: Secondary | ICD-10-CM | POA: Diagnosis not present

## 2015-04-11 DIAGNOSIS — E785 Hyperlipidemia, unspecified: Secondary | ICD-10-CM | POA: Diagnosis not present

## 2015-04-11 DIAGNOSIS — I5040 Unspecified combined systolic (congestive) and diastolic (congestive) heart failure: Secondary | ICD-10-CM | POA: Diagnosis not present

## 2015-04-11 DIAGNOSIS — K5900 Constipation, unspecified: Secondary | ICD-10-CM | POA: Diagnosis not present

## 2015-04-11 DIAGNOSIS — C9201 Acute myeloblastic leukemia, in remission: Secondary | ICD-10-CM | POA: Diagnosis not present

## 2015-04-11 DIAGNOSIS — Z95828 Presence of other vascular implants and grafts: Secondary | ICD-10-CM | POA: Diagnosis not present

## 2015-04-11 LAB — CBC AND DIFFERENTIAL
HCT: 30 % — AB (ref 41–53)
Hemoglobin: 9.7 g/dL — AB (ref 13.5–17.5)
Platelets: 957 10*3/uL — AB (ref 150–399)
WBC: 14.6 10*3/mL

## 2015-04-11 LAB — BASIC METABOLIC PANEL
BUN: 21 mg/dL (ref 4–21)
CREATININE: 1 mg/dL (ref 0.6–1.3)
GLUCOSE: 142 mg/dL
POTASSIUM: 4.5 mmol/L (ref 3.4–5.3)
SODIUM: 140 mmol/L (ref 137–147)

## 2015-04-11 LAB — HEPATIC FUNCTION PANEL
ALT: 19 U/L (ref 10–40)
AST: 18 U/L (ref 14–40)
Alkaline Phosphatase: 77 U/L (ref 25–125)
Bilirubin, Total: 0.2 mg/dL

## 2015-04-22 DIAGNOSIS — Z5111 Encounter for antineoplastic chemotherapy: Secondary | ICD-10-CM | POA: Diagnosis not present

## 2015-04-22 DIAGNOSIS — F4310 Post-traumatic stress disorder, unspecified: Secondary | ICD-10-CM | POA: Diagnosis not present

## 2015-04-22 DIAGNOSIS — Z96659 Presence of unspecified artificial knee joint: Secondary | ICD-10-CM | POA: Diagnosis present

## 2015-04-22 DIAGNOSIS — I5040 Unspecified combined systolic (congestive) and diastolic (congestive) heart failure: Secondary | ICD-10-CM | POA: Diagnosis not present

## 2015-04-22 DIAGNOSIS — Z79899 Other long term (current) drug therapy: Secondary | ICD-10-CM | POA: Diagnosis not present

## 2015-04-22 DIAGNOSIS — M545 Low back pain: Secondary | ICD-10-CM | POA: Diagnosis not present

## 2015-04-22 DIAGNOSIS — C9201 Acute myeloblastic leukemia, in remission: Secondary | ICD-10-CM | POA: Diagnosis not present

## 2015-04-22 DIAGNOSIS — K219 Gastro-esophageal reflux disease without esophagitis: Secondary | ICD-10-CM | POA: Diagnosis present

## 2015-04-22 DIAGNOSIS — Z87891 Personal history of nicotine dependence: Secondary | ICD-10-CM | POA: Diagnosis not present

## 2015-04-22 DIAGNOSIS — I10 Essential (primary) hypertension: Secondary | ICD-10-CM | POA: Diagnosis not present

## 2015-04-22 DIAGNOSIS — D630 Anemia in neoplastic disease: Secondary | ICD-10-CM | POA: Diagnosis not present

## 2015-04-22 DIAGNOSIS — T451X5A Adverse effect of antineoplastic and immunosuppressive drugs, initial encounter: Secondary | ICD-10-CM | POA: Diagnosis present

## 2015-04-22 DIAGNOSIS — D6481 Anemia due to antineoplastic chemotherapy: Secondary | ICD-10-CM | POA: Diagnosis present

## 2015-04-22 DIAGNOSIS — M1990 Unspecified osteoarthritis, unspecified site: Secondary | ICD-10-CM | POA: Diagnosis present

## 2015-04-22 DIAGNOSIS — E119 Type 2 diabetes mellitus without complications: Secondary | ICD-10-CM | POA: Diagnosis not present

## 2015-04-22 DIAGNOSIS — Z7902 Long term (current) use of antithrombotics/antiplatelets: Secondary | ICD-10-CM | POA: Diagnosis not present

## 2015-04-22 DIAGNOSIS — R410 Disorientation, unspecified: Secondary | ICD-10-CM | POA: Diagnosis not present

## 2015-04-22 DIAGNOSIS — Z79891 Long term (current) use of opiate analgesic: Secondary | ICD-10-CM | POA: Diagnosis not present

## 2015-04-22 DIAGNOSIS — C9200 Acute myeloblastic leukemia, not having achieved remission: Secondary | ICD-10-CM | POA: Diagnosis not present

## 2015-04-22 DIAGNOSIS — G4733 Obstructive sleep apnea (adult) (pediatric): Secondary | ICD-10-CM | POA: Diagnosis present

## 2015-04-22 DIAGNOSIS — Z888 Allergy status to other drugs, medicaments and biological substances status: Secondary | ICD-10-CM | POA: Diagnosis not present

## 2015-04-22 DIAGNOSIS — F419 Anxiety disorder, unspecified: Secondary | ICD-10-CM | POA: Diagnosis present

## 2015-04-22 DIAGNOSIS — R509 Fever, unspecified: Secondary | ICD-10-CM | POA: Diagnosis not present

## 2015-04-22 DIAGNOSIS — Z7982 Long term (current) use of aspirin: Secondary | ICD-10-CM | POA: Diagnosis not present

## 2015-04-22 DIAGNOSIS — Z794 Long term (current) use of insulin: Secondary | ICD-10-CM | POA: Diagnosis not present

## 2015-04-22 DIAGNOSIS — Z792 Long term (current) use of antibiotics: Secondary | ICD-10-CM | POA: Diagnosis not present

## 2015-04-22 LAB — HEPATIC FUNCTION PANEL
ALT: 15 U/L (ref 10–40)
AST: 17 U/L (ref 14–40)
Alkaline Phosphatase: 69 U/L (ref 25–125)
BILIRUBIN, TOTAL: 0.5 mg/dL

## 2015-04-22 LAB — CBC AND DIFFERENTIAL
HEMATOCRIT: 32 % — AB (ref 41–53)
HEMOGLOBIN: 10.7 g/dL — AB (ref 13.5–17.5)
Platelets: 365 10*3/uL (ref 150–399)
WBC: 10.2 10*3/mL

## 2015-04-22 LAB — BASIC METABOLIC PANEL
BUN: 26 mg/dL — AB (ref 4–21)
Creatinine: 1 mg/dL (ref 0.6–1.3)
GLUCOSE: 78 mg/dL
Potassium: 4.5 mmol/L (ref 3.4–5.3)
Sodium: 139 mmol/L (ref 137–147)

## 2015-05-02 DIAGNOSIS — E785 Hyperlipidemia, unspecified: Secondary | ICD-10-CM | POA: Diagnosis not present

## 2015-05-02 DIAGNOSIS — I10 Essential (primary) hypertension: Secondary | ICD-10-CM | POA: Diagnosis not present

## 2015-05-02 DIAGNOSIS — M109 Gout, unspecified: Secondary | ICD-10-CM | POA: Diagnosis not present

## 2015-05-02 DIAGNOSIS — M1A9XX0 Chronic gout, unspecified, without tophus (tophi): Secondary | ICD-10-CM | POA: Diagnosis not present

## 2015-05-02 DIAGNOSIS — E119 Type 2 diabetes mellitus without complications: Secondary | ICD-10-CM | POA: Diagnosis not present

## 2015-05-02 DIAGNOSIS — C9201 Acute myeloblastic leukemia, in remission: Secondary | ICD-10-CM | POA: Diagnosis not present

## 2015-05-02 DIAGNOSIS — I4891 Unspecified atrial fibrillation: Secondary | ICD-10-CM | POA: Diagnosis not present

## 2015-05-02 DIAGNOSIS — F4310 Post-traumatic stress disorder, unspecified: Secondary | ICD-10-CM | POA: Diagnosis not present

## 2015-05-02 DIAGNOSIS — R319 Hematuria, unspecified: Secondary | ICD-10-CM | POA: Diagnosis not present

## 2015-05-02 DIAGNOSIS — I5040 Unspecified combined systolic (congestive) and diastolic (congestive) heart failure: Secondary | ICD-10-CM | POA: Diagnosis not present

## 2015-05-02 DIAGNOSIS — Z79899 Other long term (current) drug therapy: Secondary | ICD-10-CM | POA: Diagnosis not present

## 2015-05-02 LAB — CBC AND DIFFERENTIAL
HCT: 32 % — AB (ref 41–53)
Hemoglobin: 10.6 g/dL — AB (ref 13.5–17.5)
WBC: 6 10^3/mL

## 2015-05-02 LAB — HEPATIC FUNCTION PANEL
ALT: 20 U/L (ref 10–40)
AST: 24 U/L (ref 14–40)
Alkaline Phosphatase: 77 U/L (ref 25–125)
BILIRUBIN, TOTAL: 0.3 mg/dL

## 2015-05-02 LAB — BASIC METABOLIC PANEL
BUN: 27 mg/dL — AB (ref 4–21)
CREATININE: 1 mg/dL (ref 0.6–1.3)
GLUCOSE: 205 mg/dL
POTASSIUM: 4 mmol/L (ref 3.4–5.3)
SODIUM: 135 mmol/L — AB (ref 137–147)

## 2015-05-06 DIAGNOSIS — E119 Type 2 diabetes mellitus without complications: Secondary | ICD-10-CM | POA: Diagnosis not present

## 2015-05-06 DIAGNOSIS — C9201 Acute myeloblastic leukemia, in remission: Secondary | ICD-10-CM | POA: Diagnosis not present

## 2015-05-06 DIAGNOSIS — R319 Hematuria, unspecified: Secondary | ICD-10-CM | POA: Diagnosis not present

## 2015-05-06 DIAGNOSIS — Z79899 Other long term (current) drug therapy: Secondary | ICD-10-CM | POA: Diagnosis not present

## 2015-05-06 DIAGNOSIS — F4312 Post-traumatic stress disorder, chronic: Secondary | ICD-10-CM | POA: Diagnosis not present

## 2015-05-06 DIAGNOSIS — E785 Hyperlipidemia, unspecified: Secondary | ICD-10-CM | POA: Diagnosis not present

## 2015-05-06 DIAGNOSIS — I10 Essential (primary) hypertension: Secondary | ICD-10-CM | POA: Diagnosis not present

## 2015-05-06 DIAGNOSIS — Z87898 Personal history of other specified conditions: Secondary | ICD-10-CM | POA: Diagnosis not present

## 2015-05-06 LAB — CBC AND DIFFERENTIAL
HCT: 33 % — AB (ref 41–53)
HEMOGLOBIN: 11 g/dL — AB (ref 13.5–17.5)
Platelets: 123 10*3/uL — AB (ref 150–399)
WBC: 7.3 10^3/mL

## 2015-05-06 LAB — BASIC METABOLIC PANEL
BUN: 24 mg/dL — AB (ref 4–21)
CREATININE: 1 mg/dL (ref 0.6–1.3)
GLUCOSE: 110 mg/dL
POTASSIUM: 4.4 mmol/L (ref 3.4–5.3)
SODIUM: 137 mmol/L (ref 137–147)

## 2015-05-06 LAB — HEPATIC FUNCTION PANEL
ALT: 24 U/L (ref 10–40)
AST: 21 U/L (ref 14–40)
Alkaline Phosphatase: 90 U/L (ref 25–125)
Bilirubin, Total: 0.5 mg/dL

## 2015-05-14 DIAGNOSIS — C9201 Acute myeloblastic leukemia, in remission: Secondary | ICD-10-CM | POA: Diagnosis not present

## 2015-05-14 DIAGNOSIS — I5181 Takotsubo syndrome: Secondary | ICD-10-CM | POA: Diagnosis not present

## 2015-05-20 DIAGNOSIS — C9201 Acute myeloblastic leukemia, in remission: Secondary | ICD-10-CM | POA: Diagnosis not present

## 2015-05-21 ENCOUNTER — Encounter: Payer: Self-pay | Admitting: Internal Medicine

## 2015-05-21 ENCOUNTER — Ambulatory Visit (INDEPENDENT_AMBULATORY_CARE_PROVIDER_SITE_OTHER): Payer: Medicare Other | Admitting: Internal Medicine

## 2015-05-21 VITALS — BP 126/78 | HR 92 | Temp 98.6°F | Ht 71.0 in | Wt 233.4 lb

## 2015-05-21 DIAGNOSIS — E785 Hyperlipidemia, unspecified: Secondary | ICD-10-CM

## 2015-05-21 DIAGNOSIS — Z23 Encounter for immunization: Secondary | ICD-10-CM | POA: Diagnosis not present

## 2015-05-21 DIAGNOSIS — C9201 Acute myeloblastic leukemia, in remission: Secondary | ICD-10-CM | POA: Diagnosis not present

## 2015-05-21 DIAGNOSIS — E119 Type 2 diabetes mellitus without complications: Secondary | ICD-10-CM | POA: Diagnosis not present

## 2015-05-21 DIAGNOSIS — I429 Cardiomyopathy, unspecified: Secondary | ICD-10-CM

## 2015-05-21 DIAGNOSIS — Z09 Encounter for follow-up examination after completed treatment for conditions other than malignant neoplasm: Secondary | ICD-10-CM

## 2015-05-21 LAB — LIPID PANEL
CHOLESTEROL: 269 mg/dL — AB (ref 0–200)
HDL: 48.4 mg/dL (ref >39.00)
LDL CALC: 185 mg/dL — AB (ref 0–99)
NonHDL: 220.36
TRIGLYCERIDES: 176 mg/dL — AB (ref 0.0–149.0)
Total CHOL/HDL Ratio: 6
VLDL: 35.2 mg/dL (ref 0.0–40.0)

## 2015-05-21 NOTE — Progress Notes (Signed)
Pre visit review using our clinic review tool, if applicable. No additional management support is needed unless otherwise documented below in the visit note. 

## 2015-05-21 NOTE — Assessment & Plan Note (Signed)
AML:  Closely follow-up by hematology, he seems to be doing well, next visit is couple weeks DM: Recent A1c satisfactory. Continue metformin Hyperlipidemia: Based on the last cholesterol panel, last year, Pravachol dose increase. Check a FLP. DJD: Having pain at the left knee replacement, to see orthopedic,  they will call if referral is needed Cardiomyopathy, atrial fibrillation: Induced by acute illness few months ago, although that seems to be okay now. RTC 4-5 months

## 2015-05-21 NOTE — Progress Notes (Signed)
Subjective:    Patient ID: William Stokes, male    DOB: Oct 23, 1945, 69 y.o.   MRN: [id number]  DOS:  05/21/2015 Type of visit - description :  Interval history: Hematology note from last month reviewed  Saw cardiology 05/19/2015: Felt to be a stable and recovering from cardiomyopathy and A. fib.  Labs: 05/14/2015: Potassium 5.0, creatinine 0.9, AST ALT normal 05/20/2015: Hemoglobin 12.5 HTN: BP is are within normal Diabetes: Compliance of medication, ambulatory blood sugars around 128. Hyperlipidemia: Due for labs  Review of Systems  No fever chills No chest pain, difficulty breathing. No nausea, vomiting. Saw his psychiatrist, although he was not depressed the Wellbutrin dose was increased   Past Medical History  Diagnosis Date  . Osteoarthritis     left knee  . Hyperlipidemia     was on Crestor but has been off for a couple of months  . Glaucoma   . Primary male hypogonadism   . Adenomatous colon polyp   . LAFB (left anterior fascicular block)     per baseline EKG 2009  . Anxiety   . PTSD (post-traumatic stress disorder)     went to [place], has chronic diff. sleeping  . Erectile dysfunction   . Hypertension 3/11    takes Lisinopril daily  . Sleep apnea, obstructive     uses CPAP and last sleep study in epic from 2011  . Eczema     legs and hands  . GERD (gastroesophageal reflux disease)     takes OMeprazole daily  . Diabetes mellitus 2/09    takes Januvia-started 2wks ago  . Cataracts, bilateral     immature  . Insomnia   . Sleep apnea   . 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 H/O septic shock 2016  . Respiratory failure with hypoxia ([place]) 2016    Acute  . Atrial fibrillation with RVR ([place]) 2016  . H/O transfusion of packed red blood cells 2016    given O positive red blood cells  . Type o blood, rh positive 2016    Past Surgical History  Procedure Laterality Date  . Knee arthroscopy  1999    meniscal torn L, Dr.Dalldorf  . Colonoscopy     . Quadriceps tendon repair  [place]    left leg  . Hernia repair    . Total knee arthroplasty  03/28/2012    LEFT TOTAL KNEE ARTHROPLASTY;  Surgeon: Hessie Dibble, MD;  Location: [place];  Service: Orthopedics;  Laterality: Left;  with revision tibia  . Total knee arthroplasty  08/03/2012    Procedure: TOTAL KNEE ARTHROPLASTY;  Surgeon: Hessie Dibble, MD;  Location: [place];  Service: Orthopedics;  Laterality: Right;    Social History   Social History  . Marital Status: Married    Spouse Name: N/A  . Number of Children: 2  . Years of Education: N/A   Occupational History  . Retired from [employer]   . RETIRED    Social History Main Topics  . Smoking status: Former Smoker -- 2.00 packs/day for 22 years    Types: Cigarettes    Quit date: 08/17/1991  . Smokeless tobacco: Never Used  . Alcohol Use: No  . Drug Use: No  . Sexual Activity: Yes   Other Topics Concern  . Not on file   Social History Narrative   Born in [place], Parents from [place]   In [place] x years  Medication List       This list is accurate as of: 05/21/15  9:35 PM.  Always use your most recent med list.               aspirin 81 MG tablet  Take 1 tablet (81 mg total) by mouth daily.     buPROPion 150 MG 12 hr tablet  Commonly known as:  WELLBUTRIN SR  Take 150 mg by mouth 2 (two) times daily.     COMPAZINE 10 MG tablet  Generic drug:  prochlorperazine  Take 1 tablet (10 mg total) by mouth every 6 (six) hours as needed for nausea or vomiting.     COSOPT 22.3-6.8 MG/ML ophthalmic solution  Generic drug:  dorzolamide-timolol  Place 1 drop into both eyes 2 (two) times daily.     DIFLUCAN 200 MG tablet  Generic drug:  fluconazole  Take 1 tablet (200 mg total) by mouth daily. As directed by Oncologist     freestyle lancets  Check once daily. Dx: 250.00     glucose blood test strip  Commonly known as:  FREESTYLE LITE  Check blood sugar twice daily.     ibuprofen 800  MG tablet  Commonly known as:  ADVIL,MOTRIN  Take 800 mg by mouth 2 (two) times daily.     levofloxacin 500 MG tablet  Commonly known as:  LEVAQUIN  Take 1 tablet (500 mg total) by mouth daily. As directed by Oncologist.     lidocaine-prilocaine cream  Commonly known as:  EMLA  Apply 1 application topically once. 1 hour prior to port access     lisinopril 10 MG tablet  Commonly known as:  PRINIVIL,ZESTRIL  Take 1 tablet (10 mg total) by mouth daily.     Magnesium Oxide 400 (240 MG) MG Tabs  Take 1 tablet by mouth 2 (two) times daily.     metFORMIN 850 MG tablet  Commonly known as:  GLUCOPHAGE  Take 1 tablet (850 mg total) by mouth daily with breakfast.     NEXAVAR 200 MG tablet  Generic drug:  SORAfenib  Take 2 tablets (400 mg total) by mouth 2 (two) times daily. Give on an empty stomach 1 hour before or 2 hours after meals.     omeprazole 40 MG capsule  Commonly known as:  PRILOSEC  Take 1 capsule (40 mg total) by mouth daily.     ondansetron 8 MG tablet  Commonly known as:  ZOFRAN  Take by mouth every 8 (eight) hours as needed for nausea or vomiting.     pravastatin 20 MG tablet  Commonly known as:  PRAVACHOL  Take 2 tablets (40 mg total) by mouth daily.     sertraline 100 MG tablet  Commonly known as:  ZOLOFT  Take 2 tablets (200 mg total) by mouth daily.     tadalafil 20 MG tablet  Commonly known as:  CIALIS  Take 1 tablet (20 mg total) by mouth every other day as needed. For E. D.     traMADol 50 MG tablet  Commonly known as:  ULTRAM  Take 1 tablet (50 mg total) by mouth every 6 (six) hours as needed.           Objective:   Physical Exam BP 126/78 mmHg  Pulse 92  Temp(Src) 98.6 F (37 C) (Oral)  Ht 5\' 11"  (1.803 m)  Wt 233 lb 6 oz (105.858 kg)  BMI 32.56 kg/m2  SpO2 96% General:   Well developed, well  nourished . NAD.  HEENT:  Normocephalic . Face symmetric, atraumatic Lungs:  CTA B Normal respiratory effort, no intercostal retractions, no  accessory muscle use. Heart: RRR,  no murmur.  No pretibial edema bilaterally  Skin: Not pale. Not jaundice Neurologic:  alert & oriented X3.  Speech normal, gait appropriate for age and unassisted Psych--  Cognition and judgment appear intact.  Cooperative with normal attention span and concentration.  Behavior appropriate. No anxious or depressed appearing.      Assessment & Plan:   Assessment > Acute myelomonocytic leukemia dx 12-2014  Admitted with shock, respiratory failure.  Had stress cardiomyopathy , Afib, s/p amiodarone- resolved as of 05-2015 DM 2009 Hyperlipidemia Sleep apnea on CPAP Anxiety, PTSD, insomnia -- Per Dr. Carlis Abbott at the [facility], he is seen every 3 months  DJD GERD H/o hypogonadism  Plan: AML:  Closely follow-up by hematology, he seems to be doing well, next visit is couple weeks DM: Recent A1c satisfactory. Continue metformin Hyperlipidemia: Based on the last cholesterol panel, last year, Pravachol dose increase. Check a FLP. DJD: Having pain at the left knee replacement, to see orthopedic,  they will call if referral is needed Cardiomyopathy, atrial fibrillation: Induced by acute illness few months ago, although that seems to be okay now. RTC 4-5 months

## 2015-05-21 NOTE — Patient Instructions (Signed)
Get your blood work before you leave    Next visit  for a routine checkup - 30 minutes- in 4-5 months. Please schedule an appointment at the front desk No need to come back fasting

## 2015-05-22 MED ORDER — PRAVASTATIN SODIUM 40 MG PO TABS: 60.0000 mg | ORAL_TABLET | Freq: Every day | ORAL | Status: DC

## 2015-05-22 NOTE — Addendum Note (Signed)
Addended by: Wilfrid Lund on: 05/22/2015 02:30 PM   Modules accepted: Orders, Medications

## 2015-06-03 DIAGNOSIS — E785 Hyperlipidemia, unspecified: Secondary | ICD-10-CM | POA: Diagnosis not present

## 2015-06-03 DIAGNOSIS — I10 Essential (primary) hypertension: Secondary | ICD-10-CM | POA: Diagnosis not present

## 2015-06-03 DIAGNOSIS — E119 Type 2 diabetes mellitus without complications: Secondary | ICD-10-CM | POA: Diagnosis not present

## 2015-06-03 DIAGNOSIS — C9201 Acute myeloblastic leukemia, in remission: Secondary | ICD-10-CM | POA: Diagnosis not present

## 2015-06-03 DIAGNOSIS — F4310 Post-traumatic stress disorder, unspecified: Secondary | ICD-10-CM | POA: Diagnosis not present

## 2015-06-03 DIAGNOSIS — Z7952 Long term (current) use of systemic steroids: Secondary | ICD-10-CM | POA: Diagnosis not present

## 2015-06-03 DIAGNOSIS — I5040 Unspecified combined systolic (congestive) and diastolic (congestive) heart failure: Secondary | ICD-10-CM | POA: Diagnosis not present

## 2015-06-03 DIAGNOSIS — Z79899 Other long term (current) drug therapy: Secondary | ICD-10-CM | POA: Diagnosis not present

## 2015-06-03 DIAGNOSIS — Z7984 Long term (current) use of oral hypoglycemic drugs: Secondary | ICD-10-CM | POA: Diagnosis not present

## 2015-06-03 DIAGNOSIS — I4891 Unspecified atrial fibrillation: Secondary | ICD-10-CM | POA: Diagnosis not present

## 2015-06-03 DIAGNOSIS — Z7982 Long term (current) use of aspirin: Secondary | ICD-10-CM | POA: Diagnosis not present

## 2015-07-01 DIAGNOSIS — I4891 Unspecified atrial fibrillation: Secondary | ICD-10-CM | POA: Diagnosis not present

## 2015-07-01 DIAGNOSIS — I5040 Unspecified combined systolic (congestive) and diastolic (congestive) heart failure: Secondary | ICD-10-CM | POA: Diagnosis not present

## 2015-07-01 DIAGNOSIS — M1A9XX0 Chronic gout, unspecified, without tophus (tophi): Secondary | ICD-10-CM | POA: Diagnosis not present

## 2015-07-01 DIAGNOSIS — Z7984 Long term (current) use of oral hypoglycemic drugs: Secondary | ICD-10-CM | POA: Diagnosis not present

## 2015-07-01 DIAGNOSIS — C9201 Acute myeloblastic leukemia, in remission: Secondary | ICD-10-CM | POA: Diagnosis not present

## 2015-07-01 DIAGNOSIS — E785 Hyperlipidemia, unspecified: Secondary | ICD-10-CM | POA: Diagnosis not present

## 2015-07-01 DIAGNOSIS — R197 Diarrhea, unspecified: Secondary | ICD-10-CM | POA: Diagnosis not present

## 2015-07-01 DIAGNOSIS — E119 Type 2 diabetes mellitus without complications: Secondary | ICD-10-CM | POA: Diagnosis not present

## 2015-07-01 DIAGNOSIS — Z79899 Other long term (current) drug therapy: Secondary | ICD-10-CM | POA: Diagnosis not present

## 2015-07-01 DIAGNOSIS — I10 Essential (primary) hypertension: Secondary | ICD-10-CM | POA: Diagnosis not present

## 2015-07-01 DIAGNOSIS — R21 Rash and other nonspecific skin eruption: Secondary | ICD-10-CM | POA: Diagnosis not present

## 2015-07-01 DIAGNOSIS — M545 Low back pain: Secondary | ICD-10-CM | POA: Diagnosis not present

## 2015-07-01 DIAGNOSIS — F4310 Post-traumatic stress disorder, unspecified: Secondary | ICD-10-CM | POA: Diagnosis not present

## 2015-07-01 DIAGNOSIS — Z7982 Long term (current) use of aspirin: Secondary | ICD-10-CM | POA: Diagnosis not present

## 2015-07-01 DIAGNOSIS — Z9221 Personal history of antineoplastic chemotherapy: Secondary | ICD-10-CM | POA: Diagnosis not present

## 2015-07-28 ENCOUNTER — Telehealth: Payer: Self-pay | Admitting: *Deleted

## 2015-07-28 NOTE — Telephone Encounter (Signed)
Filled out and forwarded to Dr. Paz for signature. JG//CMA 

## 2015-07-29 DIAGNOSIS — R21 Rash and other nonspecific skin eruption: Secondary | ICD-10-CM | POA: Diagnosis not present

## 2015-07-29 DIAGNOSIS — C9201 Acute myeloblastic leukemia, in remission: Secondary | ICD-10-CM | POA: Diagnosis not present

## 2015-07-29 DIAGNOSIS — R194 Change in bowel habit: Secondary | ICD-10-CM | POA: Diagnosis not present

## 2015-07-29 DIAGNOSIS — Z6834 Body mass index (BMI) 34.0-34.9, adult: Secondary | ICD-10-CM | POA: Diagnosis not present

## 2015-07-29 DIAGNOSIS — I10 Essential (primary) hypertension: Secondary | ICD-10-CM | POA: Diagnosis not present

## 2015-07-29 DIAGNOSIS — E119 Type 2 diabetes mellitus without complications: Secondary | ICD-10-CM | POA: Diagnosis not present

## 2015-07-29 DIAGNOSIS — Z7982 Long term (current) use of aspirin: Secondary | ICD-10-CM | POA: Diagnosis not present

## 2015-07-30 NOTE — Telephone Encounter (Signed)
Signed form faxed to [facility] successfully at [phone]. Sent for scanning. JG//CMA

## 2015-08-29 DIAGNOSIS — Z95828 Presence of other vascular implants and grafts: Secondary | ICD-10-CM | POA: Diagnosis not present

## 2015-08-29 DIAGNOSIS — C9201 Acute myeloblastic leukemia, in remission: Secondary | ICD-10-CM | POA: Diagnosis not present

## 2015-08-29 DIAGNOSIS — E785 Hyperlipidemia, unspecified: Secondary | ICD-10-CM | POA: Diagnosis not present

## 2015-08-29 DIAGNOSIS — E119 Type 2 diabetes mellitus without complications: Secondary | ICD-10-CM | POA: Diagnosis not present

## 2015-08-29 DIAGNOSIS — I10 Essential (primary) hypertension: Secondary | ICD-10-CM | POA: Diagnosis not present

## 2015-08-29 DIAGNOSIS — Z6835 Body mass index (BMI) 35.0-35.9, adult: Secondary | ICD-10-CM | POA: Diagnosis not present

## 2015-08-29 DIAGNOSIS — Z7982 Long term (current) use of aspirin: Secondary | ICD-10-CM | POA: Diagnosis not present

## 2015-08-29 DIAGNOSIS — E559 Vitamin D deficiency, unspecified: Secondary | ICD-10-CM | POA: Diagnosis not present

## 2015-08-29 DIAGNOSIS — Z7984 Long term (current) use of oral hypoglycemic drugs: Secondary | ICD-10-CM | POA: Diagnosis not present

## 2015-08-29 DIAGNOSIS — F4310 Post-traumatic stress disorder, unspecified: Secondary | ICD-10-CM | POA: Diagnosis not present

## 2015-09-22 ENCOUNTER — Ambulatory Visit: Payer: Medicare Other | Admitting: Internal Medicine

## 2015-09-26 DIAGNOSIS — E559 Vitamin D deficiency, unspecified: Secondary | ICD-10-CM | POA: Diagnosis not present

## 2015-09-26 DIAGNOSIS — I10 Essential (primary) hypertension: Secondary | ICD-10-CM | POA: Diagnosis not present

## 2015-09-26 DIAGNOSIS — C9201 Acute myeloblastic leukemia, in remission: Secondary | ICD-10-CM | POA: Diagnosis not present

## 2015-09-26 DIAGNOSIS — I5040 Unspecified combined systolic (congestive) and diastolic (congestive) heart failure: Secondary | ICD-10-CM | POA: Diagnosis not present

## 2015-09-26 DIAGNOSIS — E785 Hyperlipidemia, unspecified: Secondary | ICD-10-CM | POA: Diagnosis not present

## 2015-09-26 DIAGNOSIS — F4312 Post-traumatic stress disorder, chronic: Secondary | ICD-10-CM | POA: Diagnosis not present

## 2015-09-26 DIAGNOSIS — E119 Type 2 diabetes mellitus without complications: Secondary | ICD-10-CM | POA: Diagnosis not present

## 2015-10-24 DIAGNOSIS — C9201 Acute myeloblastic leukemia, in remission: Secondary | ICD-10-CM | POA: Diagnosis not present

## 2015-10-29 ENCOUNTER — Ambulatory Visit (INDEPENDENT_AMBULATORY_CARE_PROVIDER_SITE_OTHER): Payer: Medicare Other | Admitting: Internal Medicine

## 2015-10-29 VITALS — BP 118/66 | HR 82 | Temp 98.2°F | Ht 71.0 in | Wt 256.1 lb

## 2015-10-29 DIAGNOSIS — E785 Hyperlipidemia, unspecified: Secondary | ICD-10-CM

## 2015-10-29 DIAGNOSIS — E119 Type 2 diabetes mellitus without complications: Secondary | ICD-10-CM

## 2015-10-29 DIAGNOSIS — Z09 Encounter for follow-up examination after completed treatment for conditions other than malignant neoplasm: Secondary | ICD-10-CM

## 2015-10-29 DIAGNOSIS — C9201 Acute myeloblastic leukemia, in remission: Secondary | ICD-10-CM

## 2015-10-29 LAB — LIPID PANEL
CHOLESTEROL: 227 mg/dL — AB (ref 0–200)
HDL: 45.3 mg/dL (ref >39.00)
LDL Cholesterol: 151 mg/dL — ABNORMAL HIGH (ref 0–99)
NONHDL: 182.17
Total CHOL/HDL Ratio: 5
Triglycerides: 158 mg/dL — ABNORMAL HIGH (ref 0.0–149.0)
VLDL: 31.6 mg/dL (ref 0.0–40.0)

## 2015-10-29 LAB — HEMOGLOBIN A1C: HEMOGLOBIN A1C: 6.6 % — AB (ref 4.6–6.5)

## 2015-10-29 NOTE — Progress Notes (Signed)
Pre visit review using our clinic review tool, if applicable. No additional management support is needed unless otherwise documented below in the visit note. 

## 2015-10-29 NOTE — Patient Instructions (Addendum)
GO TO THE LAB :      Get the blood work     GO TO THE FRONT DESK Schedule your next appointment for a  Routine check up When?   4 months  Fasting?  Yes   Ask the Fr[facility] if you had a PREVNAR, pneumonia shot, let me know

## 2015-10-29 NOTE — Progress Notes (Signed)
Subjective:    Patient ID: William Stokes, male    DOB: [birth year], 70 y.o.   MRN: [id number]  DOS:  10/29/2015 Type of visit - description :  Routine checkup Interval history:  leukemia: Closely follow-up by oncology, labs done at [facility] reviewed. [id number] med compliance, CBGs in the 120s HTN: ambulatory BPs in the 120s.   Review of Systems  denies chest pain, difficulty breathing No nausea vomitig, diarrhea.  Past Medical History  Diagnosis Date  . Osteoarthritis     left knee  . Hyperlipidemia     was on Crestor but has been off for a couple of months  . Glaucoma   . Primary male hypogonadism   . Adenomatous colon polyp   . LAFB (left anterior fascicular block)     per baseline EKG 2009  . Anxiety   . PTSD (post-traumatic stress disorder)     went to [place], has chronic diff. sleeping  . Erectile dysfunction   . Hypertension 3/11    takes Lisinopril daily  . Sleep apnea, obstructive     uses CPAP and last sleep study in epic from 2011  . Eczema     legs and hands  . GERD (gastroesophageal reflux disease)     takes OMeprazole daily  . Diabetes mellitus 2/09    takes Januvia-started 2wks ago  . Cataracts, bilateral     immature  . Insomnia   . Sleep apnea   . 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 H/O septic shock 2016  . Respiratory failure with hypoxia ([place]) 2016    Acute  . Atrial fibrillation with RVR ([place]) 2016  . H/O transfusion of packed red blood cells [id number]    [facility]    Past Surgical History  Procedure Laterality Date  . Knee arthroscopy  1999    meniscal torn L, Dr.Dalldorf  . Colonoscopy    . Quadriceps tendon repair  [place]    [place] leg  . Hernia repair    . Total knee arthroplasty  03/28/2012    LEFT TOTAL KNEE ARTHROPLASTY;  Surgeon: Hessie Dibble, MD;  Location: [place];  Service: Orthopedics;  Laterality: Left;  with revision tibia  . Total knee arthroplasty  08/03/2012    Procedure: TOTAL  KNEE ARTHROPLASTY;  Surgeon: Hessie Dibble, MD;  Location: [place];  Service: Orthopedics;  Laterality: Right;    Social History   Social History  . Marital Status: Married    Spouse Name: N/A  . Number of Children: 2  . Years of Education: N/A   Occupational History  . Retired from [employer]   . RETIRED    Social History Main Topics  . Smoking status: Former Smoker -- 2.00 packs/day for 22 years    Types: Cigarettes    Quit date: 08/17/1991  . Smokeless tobacco: Never Used  . Alcohol Use: No  . Drug Use: No  . Sexual Activity: Yes   Other Topics Concern  . Not on file   Social History Narrative   Born in [place], Parents from [place]   In No Name x years                 Medication List       This list is accurate as of: 10/29/15 11:59 PM.  Always use your most recent med list.               aspirin 81 MG tablet  Take 1 tablet (81 mg total) by mouth daily.     buPROPion 150 MG 12 hr tablet  Commonly known as:  WELLBUTRIN SR  Take 150 mg by mouth 2 (two) times daily.     COMPAZINE 10 MG tablet  Generic drug:  prochlorperazine  Take 10 mg by mouth every 6 (six) hours as needed for nausea or vomiting. Reported on 10/29/2015     COSOPT 22.3-6.8 MG/ML ophthalmic solution  Generic drug:  dorzolamide-timolol  Place 1 drop into both eyes 2 (two) times daily.     DIFLUCAN 200 MG tablet  Generic drug:  fluconazole  Take 200 mg by mouth daily. Reported on 10/29/2015     freestyle lancets  Check once daily. Dx: 250.00     glucose blood test strip  Commonly known as:  FREESTYLE LITE  Check blood sugar twice daily.     ibuprofen 800 MG tablet  Commonly known as:  ADVIL,MOTRIN  Take 800 mg by mouth 2 (two) times daily.     levofloxacin 500 MG tablet  Commonly known as:  LEVAQUIN  Take 500 mg by mouth daily. Reported on 10/29/2015     lidocaine-prilocaine cream  Commonly known as:  EMLA  Apply 1 application topically once. 1 hour prior to port access      lisinopril 10 MG tablet  Commonly known as:  PRINIVIL,ZESTRIL  Take 1 tablet (10 mg total) by mouth daily.     Magnesium Oxide 400 (240 Mg) MG Tabs  Take 1 tablet by mouth 2 (two) times daily.     metFORMIN 850 MG tablet  Commonly known as:  GLUCOPHAGE  Take 1 tablet (850 mg total) by mouth daily with breakfast.     omeprazole 40 MG capsule  Commonly known as:  PRILOSEC  Take 1 capsule (40 mg total) by mouth daily.     ondansetron 8 MG tablet  Commonly known as:  ZOFRAN  Take by mouth every 8 (eight) hours as needed for nausea or vomiting. Reported on 10/29/2015     pravastatin 40 MG tablet  Commonly known as:  PRAVACHOL  Take 1.5 tablets (60 mg total) by mouth daily.     sertraline 100 MG tablet  Commonly known as:  ZOLOFT  Take 2 tablets (200 mg total) by mouth daily.           Objective:   Physical Exam BP 118/66 mmHg  Pulse 82  Temp(Src) 98.2 F (36.8 C) (Oral)  Ht 5\' 11"  (1.803 m)  Wt 256 lb 2 oz (116.178 kg)  BMI 35.74 kg/m2  SpO2 97% General:   Well developed, well nourished . NAD.  HEENT:  Normocephalic . Face symmetric, atraumatic Lungs:  CTA B Normal respiratory effort, no intercostal retractions, no accessory muscle use. Heart: RRR,  no murmur.  No pretibial edema bilaterally  Skin: Not pale. Not jaundice DIABETIC FEET EXAM: No lower extremity edema Normal pedal pulses bilaterally Skin normal, nails normal, no calluses Pinprick examination of the feet normal. Neurologic:  alert & oriented X3.  Speech normal, gait appropriate for age and unassisted Psych--  Cognition and judgment appear intact.  Cooperative with normal attention span and concentration.  Behavior appropriate. No anxious or depressed appearing.      Assessment & Plan:   Assessment > Acute myelomonocytic leukemia dx 12-2014  Admitted with shock, respiratory failure.  Had stress cardiomyopathy , Afib, s/p amiodarone- resolved as of 05-2015 intolerant to  Nexavar  DM  2009 Hyperlipidemia Sleep apnea on  CPAP Anxiety, PTSD, insomnia -- Per [facility] Carlis Abbott at [facility], he is seen every 3 months  DJD-- daily ibuprofen GERD H/o hypogonadism  PLAN: Medications on hold: Diflucan, Levaquin, zofran, Compazine   Labs from 10/24/2015: Uric acid 7.0, potassium 4.7, creat 1.0, calcium 9.9, LFTs normal AML-- seems to be stable DM-- cont metformin, recent labs ok, check a A1C; feet check wnl, Hyperlipidemia: based on last FLP pravachol dose increased, check labs [facility]. RTC 4 months

## 2015-10-30 NOTE — Assessment & Plan Note (Signed)
Medications on hold: Diflucan, Levaquin, zofran, Compazine   Labs from 10/24/2015: Uric acid 7.0, potassium 4.7, creat 1.0, calcium 9.9, LFTs normal AML-- seems to be stable DM-- cont metformin, recent labs ok, check a A1C; feet check wnl, Hyperlipidemia: based on last FLP pravachol dose increased, check labs [facility] had a Prevnar. RTC 4 months

## 2015-10-31 MED ORDER — ATORVASTATIN CALCIUM 40 MG PO TABS: 40.0000 mg | ORAL_TABLET | Freq: Every day | ORAL | Status: DC

## 2015-10-31 NOTE — Addendum Note (Signed)
Addended by: Damita Dunnings D on: 10/31/2015 03:05 PM   Modules accepted: Orders, Medications

## 2015-11-25 DIAGNOSIS — I4891 Unspecified atrial fibrillation: Secondary | ICD-10-CM | POA: Diagnosis not present

## 2015-11-25 DIAGNOSIS — Z7984 Long term (current) use of oral hypoglycemic drugs: Secondary | ICD-10-CM | POA: Diagnosis not present

## 2015-11-25 DIAGNOSIS — Z7982 Long term (current) use of aspirin: Secondary | ICD-10-CM | POA: Diagnosis not present

## 2015-11-25 DIAGNOSIS — I5042 Chronic combined systolic (congestive) and diastolic (congestive) heart failure: Secondary | ICD-10-CM | POA: Diagnosis not present

## 2015-11-25 DIAGNOSIS — I5040 Unspecified combined systolic (congestive) and diastolic (congestive) heart failure: Secondary | ICD-10-CM | POA: Diagnosis not present

## 2015-11-25 DIAGNOSIS — I110 Hypertensive heart disease with heart failure: Secondary | ICD-10-CM | POA: Diagnosis not present

## 2015-11-25 DIAGNOSIS — C9201 Acute myeloblastic leukemia, in remission: Secondary | ICD-10-CM | POA: Diagnosis not present

## 2015-11-25 DIAGNOSIS — F4312 Post-traumatic stress disorder, chronic: Secondary | ICD-10-CM | POA: Diagnosis not present

## 2015-11-25 DIAGNOSIS — Z79899 Other long term (current) drug therapy: Secondary | ICD-10-CM | POA: Diagnosis not present

## 2015-11-25 DIAGNOSIS — Z9221 Personal history of antineoplastic chemotherapy: Secondary | ICD-10-CM | POA: Diagnosis not present

## 2015-11-25 DIAGNOSIS — I10 Essential (primary) hypertension: Secondary | ICD-10-CM | POA: Diagnosis not present

## 2015-11-25 DIAGNOSIS — E785 Hyperlipidemia, unspecified: Secondary | ICD-10-CM | POA: Diagnosis not present

## 2015-11-25 DIAGNOSIS — E559 Vitamin D deficiency, unspecified: Secondary | ICD-10-CM | POA: Diagnosis not present

## 2015-11-25 DIAGNOSIS — E119 Type 2 diabetes mellitus without complications: Secondary | ICD-10-CM | POA: Diagnosis not present

## 2015-12-12 ENCOUNTER — Other Ambulatory Visit (INDEPENDENT_AMBULATORY_CARE_PROVIDER_SITE_OTHER): Payer: Medicare Other

## 2015-12-12 DIAGNOSIS — E785 Hyperlipidemia, unspecified: Secondary | ICD-10-CM | POA: Diagnosis not present

## 2015-12-12 LAB — LIPID PANEL
CHOL/HDL RATIO: 5
Cholesterol: 199 mg/dL (ref 0–200)
HDL: 39.5 mg/dL (ref >39.00)
LDL CALC: 140 mg/dL — AB (ref 0–99)
NonHDL: 159.94
Triglycerides: 101 mg/dL (ref 0.0–149.0)
VLDL: 20.2 mg/dL (ref 0.0–40.0)

## 2015-12-12 LAB — AST: AST: 14 U/L (ref 0–37)

## 2015-12-12 LAB — ALT: ALT: 12 U/L (ref 0–53)

## 2015-12-15 MED ORDER — ATORVASTATIN CALCIUM 80 MG PO TABS: 80.0000 mg | ORAL_TABLET | Freq: Every day | ORAL | Status: DC

## 2015-12-15 NOTE — Addendum Note (Signed)
Addended by: Dorrene German on: 12/15/2015 05:07 PM   Modules accepted: Orders

## 2015-12-30 DIAGNOSIS — E785 Hyperlipidemia, unspecified: Secondary | ICD-10-CM | POA: Diagnosis not present

## 2015-12-30 DIAGNOSIS — Z7982 Long term (current) use of aspirin: Secondary | ICD-10-CM | POA: Diagnosis not present

## 2015-12-30 DIAGNOSIS — I10 Essential (primary) hypertension: Secondary | ICD-10-CM | POA: Diagnosis not present

## 2015-12-30 DIAGNOSIS — R21 Rash and other nonspecific skin eruption: Secondary | ICD-10-CM | POA: Diagnosis not present

## 2015-12-30 DIAGNOSIS — C9201 Acute myeloblastic leukemia, in remission: Secondary | ICD-10-CM | POA: Diagnosis not present

## 2015-12-30 DIAGNOSIS — R269 Unspecified abnormalities of gait and mobility: Secondary | ICD-10-CM | POA: Diagnosis not present

## 2015-12-30 DIAGNOSIS — Z9221 Personal history of antineoplastic chemotherapy: Secondary | ICD-10-CM | POA: Diagnosis not present

## 2015-12-30 DIAGNOSIS — E559 Vitamin D deficiency, unspecified: Secondary | ICD-10-CM | POA: Diagnosis not present

## 2015-12-30 DIAGNOSIS — F4310 Post-traumatic stress disorder, unspecified: Secondary | ICD-10-CM | POA: Diagnosis not present

## 2015-12-30 DIAGNOSIS — I4891 Unspecified atrial fibrillation: Secondary | ICD-10-CM | POA: Diagnosis not present

## 2015-12-30 DIAGNOSIS — E119 Type 2 diabetes mellitus without complications: Secondary | ICD-10-CM | POA: Diagnosis not present

## 2015-12-30 DIAGNOSIS — F4312 Post-traumatic stress disorder, chronic: Secondary | ICD-10-CM | POA: Diagnosis not present

## 2015-12-30 DIAGNOSIS — I110 Hypertensive heart disease with heart failure: Secondary | ICD-10-CM | POA: Diagnosis not present

## 2015-12-30 DIAGNOSIS — Z95828 Presence of other vascular implants and grafts: Secondary | ICD-10-CM | POA: Diagnosis not present

## 2015-12-30 DIAGNOSIS — Z79899 Other long term (current) drug therapy: Secondary | ICD-10-CM | POA: Diagnosis not present

## 2015-12-30 DIAGNOSIS — Z7984 Long term (current) use of oral hypoglycemic drugs: Secondary | ICD-10-CM | POA: Diagnosis not present

## 2015-12-30 DIAGNOSIS — I5040 Unspecified combined systolic (congestive) and diastolic (congestive) heart failure: Secondary | ICD-10-CM | POA: Diagnosis not present

## 2016-02-10 DIAGNOSIS — Z7984 Long term (current) use of oral hypoglycemic drugs: Secondary | ICD-10-CM | POA: Diagnosis not present

## 2016-02-10 DIAGNOSIS — D709 Neutropenia, unspecified: Secondary | ICD-10-CM | POA: Diagnosis not present

## 2016-02-10 DIAGNOSIS — I110 Hypertensive heart disease with heart failure: Secondary | ICD-10-CM | POA: Diagnosis not present

## 2016-02-10 DIAGNOSIS — R21 Rash and other nonspecific skin eruption: Secondary | ICD-10-CM | POA: Diagnosis not present

## 2016-02-10 DIAGNOSIS — Z9221 Personal history of antineoplastic chemotherapy: Secondary | ICD-10-CM | POA: Diagnosis not present

## 2016-02-10 DIAGNOSIS — E119 Type 2 diabetes mellitus without complications: Secondary | ICD-10-CM | POA: Diagnosis not present

## 2016-02-10 DIAGNOSIS — C9202 Acute myeloblastic leukemia, in relapse: Secondary | ICD-10-CM | POA: Diagnosis not present

## 2016-02-10 DIAGNOSIS — R269 Unspecified abnormalities of gait and mobility: Secondary | ICD-10-CM | POA: Diagnosis not present

## 2016-02-10 DIAGNOSIS — E559 Vitamin D deficiency, unspecified: Secondary | ICD-10-CM | POA: Diagnosis not present

## 2016-02-10 DIAGNOSIS — E785 Hyperlipidemia, unspecified: Secondary | ICD-10-CM | POA: Diagnosis not present

## 2016-02-10 DIAGNOSIS — F4312 Post-traumatic stress disorder, chronic: Secondary | ICD-10-CM | POA: Diagnosis not present

## 2016-02-10 DIAGNOSIS — Z79899 Other long term (current) drug therapy: Secondary | ICD-10-CM | POA: Diagnosis not present

## 2016-02-10 DIAGNOSIS — I4891 Unspecified atrial fibrillation: Secondary | ICD-10-CM | POA: Diagnosis not present

## 2016-02-10 DIAGNOSIS — Z7982 Long term (current) use of aspirin: Secondary | ICD-10-CM | POA: Diagnosis not present

## 2016-02-10 DIAGNOSIS — I10 Essential (primary) hypertension: Secondary | ICD-10-CM | POA: Diagnosis not present

## 2016-02-10 DIAGNOSIS — C9201 Acute myeloblastic leukemia, in remission: Secondary | ICD-10-CM | POA: Diagnosis not present

## 2016-02-10 DIAGNOSIS — I5040 Unspecified combined systolic (congestive) and diastolic (congestive) heart failure: Secondary | ICD-10-CM | POA: Diagnosis not present

## 2016-02-10 DIAGNOSIS — F4310 Post-traumatic stress disorder, unspecified: Secondary | ICD-10-CM | POA: Diagnosis not present

## 2016-02-13 DIAGNOSIS — I5040 Unspecified combined systolic (congestive) and diastolic (congestive) heart failure: Secondary | ICD-10-CM | POA: Diagnosis not present

## 2016-02-13 DIAGNOSIS — D2211 Melanocytic nevi of right eyelid, including canthus: Secondary | ICD-10-CM | POA: Diagnosis not present

## 2016-02-13 DIAGNOSIS — J9859 Other diseases of mediastinum, not elsewhere classified: Secondary | ICD-10-CM | POA: Diagnosis not present

## 2016-02-13 DIAGNOSIS — Z7982 Long term (current) use of aspirin: Secondary | ICD-10-CM | POA: Diagnosis not present

## 2016-02-13 DIAGNOSIS — E785 Hyperlipidemia, unspecified: Secondary | ICD-10-CM | POA: Diagnosis not present

## 2016-02-13 DIAGNOSIS — C9202 Acute myeloblastic leukemia, in relapse: Secondary | ICD-10-CM | POA: Diagnosis not present

## 2016-02-13 DIAGNOSIS — Z9221 Personal history of antineoplastic chemotherapy: Secondary | ICD-10-CM | POA: Diagnosis not present

## 2016-02-13 DIAGNOSIS — Z79899 Other long term (current) drug therapy: Secondary | ICD-10-CM | POA: Diagnosis not present

## 2016-02-13 DIAGNOSIS — H35033 Hypertensive retinopathy, bilateral: Secondary | ICD-10-CM | POA: Diagnosis not present

## 2016-02-13 DIAGNOSIS — I4891 Unspecified atrial fibrillation: Secondary | ICD-10-CM | POA: Diagnosis not present

## 2016-02-13 DIAGNOSIS — E559 Vitamin D deficiency, unspecified: Secondary | ICD-10-CM | POA: Diagnosis not present

## 2016-02-13 DIAGNOSIS — E119 Type 2 diabetes mellitus without complications: Secondary | ICD-10-CM | POA: Diagnosis not present

## 2016-02-13 DIAGNOSIS — Z7984 Long term (current) use of oral hypoglycemic drugs: Secondary | ICD-10-CM | POA: Diagnosis not present

## 2016-02-13 DIAGNOSIS — D47Z9 Other specified neoplasms of uncertain behavior of lymphoid, hematopoietic and related tissue: Secondary | ICD-10-CM | POA: Diagnosis not present

## 2016-02-13 DIAGNOSIS — I110 Hypertensive heart disease with heart failure: Secondary | ICD-10-CM | POA: Diagnosis not present

## 2016-02-13 DIAGNOSIS — Z87891 Personal history of nicotine dependence: Secondary | ICD-10-CM | POA: Diagnosis not present

## 2016-02-13 DIAGNOSIS — Z006 Encounter for examination for normal comparison and control in clinical research program: Secondary | ICD-10-CM | POA: Diagnosis not present

## 2016-02-13 DIAGNOSIS — I358 Other nonrheumatic aortic valve disorders: Secondary | ICD-10-CM | POA: Diagnosis not present

## 2016-02-13 DIAGNOSIS — I459 Conduction disorder, unspecified: Secondary | ICD-10-CM | POA: Diagnosis not present

## 2016-02-13 DIAGNOSIS — T451X5S Adverse effect of antineoplastic and immunosuppressive drugs, sequela: Secondary | ICD-10-CM | POA: Diagnosis not present

## 2016-02-20 DIAGNOSIS — R21 Rash and other nonspecific skin eruption: Secondary | ICD-10-CM | POA: Diagnosis not present

## 2016-02-20 DIAGNOSIS — Z95828 Presence of other vascular implants and grafts: Secondary | ICD-10-CM | POA: Diagnosis not present

## 2016-02-20 DIAGNOSIS — I5042 Chronic combined systolic (congestive) and diastolic (congestive) heart failure: Secondary | ICD-10-CM | POA: Diagnosis not present

## 2016-02-20 DIAGNOSIS — Z7984 Long term (current) use of oral hypoglycemic drugs: Secondary | ICD-10-CM | POA: Diagnosis not present

## 2016-02-20 DIAGNOSIS — Z79899 Other long term (current) drug therapy: Secondary | ICD-10-CM | POA: Diagnosis not present

## 2016-02-20 DIAGNOSIS — F4310 Post-traumatic stress disorder, unspecified: Secondary | ICD-10-CM | POA: Diagnosis not present

## 2016-02-20 DIAGNOSIS — E559 Vitamin D deficiency, unspecified: Secondary | ICD-10-CM | POA: Diagnosis not present

## 2016-02-20 DIAGNOSIS — E119 Type 2 diabetes mellitus without complications: Secondary | ICD-10-CM | POA: Diagnosis not present

## 2016-02-20 DIAGNOSIS — C9202 Acute myeloblastic leukemia, in relapse: Secondary | ICD-10-CM | POA: Diagnosis not present

## 2016-02-20 DIAGNOSIS — Z7982 Long term (current) use of aspirin: Secondary | ICD-10-CM | POA: Diagnosis not present

## 2016-02-20 DIAGNOSIS — I4891 Unspecified atrial fibrillation: Secondary | ICD-10-CM | POA: Diagnosis not present

## 2016-02-20 DIAGNOSIS — F4312 Post-traumatic stress disorder, chronic: Secondary | ICD-10-CM | POA: Diagnosis not present

## 2016-02-20 DIAGNOSIS — Z5111 Encounter for antineoplastic chemotherapy: Secondary | ICD-10-CM | POA: Diagnosis not present

## 2016-02-20 DIAGNOSIS — I10 Essential (primary) hypertension: Secondary | ICD-10-CM | POA: Diagnosis not present

## 2016-02-20 DIAGNOSIS — E785 Hyperlipidemia, unspecified: Secondary | ICD-10-CM | POA: Diagnosis not present

## 2016-02-20 LAB — CBC AND DIFFERENTIAL
HCT: 37 % — AB (ref 41–53)
HEMOGLOBIN: 12.6 g/dL — AB (ref 13.5–17.5)
Platelets: 161 10*3/uL (ref 150–399)
WBC: 3.7 10*3/mL

## 2016-02-20 LAB — HEPATIC FUNCTION PANEL
ALT: 18 U/L (ref 10–40)
AST: 19 U/L (ref 14–40)
Alkaline Phosphatase: 60 U/L (ref 25–125)
Bilirubin, Total: 0.4 mg/dL

## 2016-02-20 LAB — BASIC METABOLIC PANEL
BUN: 27 mg/dL — AB (ref 4–21)
CREATININE: 1.1 mg/dL (ref 0.6–1.3)
GLUCOSE: 134 mg/dL
POTASSIUM: 4.3 mmol/L (ref 3.4–5.3)
SODIUM: 138 mmol/L (ref 137–147)

## 2016-02-21 DIAGNOSIS — C9002 Multiple myeloma in relapse: Secondary | ICD-10-CM | POA: Diagnosis not present

## 2016-02-21 DIAGNOSIS — Z5111 Encounter for antineoplastic chemotherapy: Secondary | ICD-10-CM | POA: Diagnosis not present

## 2016-02-21 DIAGNOSIS — Z79899 Other long term (current) drug therapy: Secondary | ICD-10-CM | POA: Diagnosis not present

## 2016-02-22 DIAGNOSIS — Z79899 Other long term (current) drug therapy: Secondary | ICD-10-CM | POA: Diagnosis not present

## 2016-02-22 DIAGNOSIS — C9202 Acute myeloblastic leukemia, in relapse: Secondary | ICD-10-CM | POA: Diagnosis not present

## 2016-02-22 DIAGNOSIS — Z5111 Encounter for antineoplastic chemotherapy: Secondary | ICD-10-CM | POA: Diagnosis not present

## 2016-02-23 ENCOUNTER — Ambulatory Visit: Payer: TRICARE For Life (TFL) | Admitting: Internal Medicine

## 2016-02-23 DIAGNOSIS — Z5111 Encounter for antineoplastic chemotherapy: Secondary | ICD-10-CM | POA: Diagnosis not present

## 2016-02-23 DIAGNOSIS — Z79899 Other long term (current) drug therapy: Secondary | ICD-10-CM | POA: Diagnosis not present

## 2016-02-23 DIAGNOSIS — C9202 Acute myeloblastic leukemia, in relapse: Secondary | ICD-10-CM | POA: Diagnosis not present

## 2016-02-24 DIAGNOSIS — I110 Hypertensive heart disease with heart failure: Secondary | ICD-10-CM | POA: Diagnosis not present

## 2016-02-24 DIAGNOSIS — E119 Type 2 diabetes mellitus without complications: Secondary | ICD-10-CM | POA: Diagnosis not present

## 2016-02-24 DIAGNOSIS — K5900 Constipation, unspecified: Secondary | ICD-10-CM | POA: Diagnosis not present

## 2016-02-24 DIAGNOSIS — Z7984 Long term (current) use of oral hypoglycemic drugs: Secondary | ICD-10-CM | POA: Diagnosis not present

## 2016-02-24 DIAGNOSIS — I4891 Unspecified atrial fibrillation: Secondary | ICD-10-CM | POA: Diagnosis not present

## 2016-02-24 DIAGNOSIS — C9202 Acute myeloblastic leukemia, in relapse: Secondary | ICD-10-CM | POA: Diagnosis not present

## 2016-02-24 DIAGNOSIS — Z7982 Long term (current) use of aspirin: Secondary | ICD-10-CM | POA: Diagnosis not present

## 2016-02-24 DIAGNOSIS — F4310 Post-traumatic stress disorder, unspecified: Secondary | ICD-10-CM | POA: Diagnosis not present

## 2016-02-24 DIAGNOSIS — R21 Rash and other nonspecific skin eruption: Secondary | ICD-10-CM | POA: Diagnosis not present

## 2016-02-24 DIAGNOSIS — R110 Nausea: Secondary | ICD-10-CM | POA: Diagnosis not present

## 2016-02-24 DIAGNOSIS — E785 Hyperlipidemia, unspecified: Secondary | ICD-10-CM | POA: Diagnosis not present

## 2016-02-24 DIAGNOSIS — Z79899 Other long term (current) drug therapy: Secondary | ICD-10-CM | POA: Diagnosis not present

## 2016-02-24 DIAGNOSIS — I5040 Unspecified combined systolic (congestive) and diastolic (congestive) heart failure: Secondary | ICD-10-CM | POA: Diagnosis not present

## 2016-02-24 DIAGNOSIS — M109 Gout, unspecified: Secondary | ICD-10-CM | POA: Diagnosis not present

## 2016-02-25 DIAGNOSIS — Z5111 Encounter for antineoplastic chemotherapy: Secondary | ICD-10-CM | POA: Diagnosis not present

## 2016-02-25 DIAGNOSIS — C9202 Acute myeloblastic leukemia, in relapse: Secondary | ICD-10-CM | POA: Diagnosis not present

## 2016-02-26 DIAGNOSIS — Z5111 Encounter for antineoplastic chemotherapy: Secondary | ICD-10-CM | POA: Diagnosis not present

## 2016-02-26 DIAGNOSIS — C9202 Acute myeloblastic leukemia, in relapse: Secondary | ICD-10-CM | POA: Diagnosis not present

## 2016-02-27 DIAGNOSIS — Z79899 Other long term (current) drug therapy: Secondary | ICD-10-CM | POA: Diagnosis not present

## 2016-02-27 DIAGNOSIS — E559 Vitamin D deficiency, unspecified: Secondary | ICD-10-CM | POA: Diagnosis not present

## 2016-02-27 DIAGNOSIS — Z7982 Long term (current) use of aspirin: Secondary | ICD-10-CM | POA: Diagnosis not present

## 2016-02-27 DIAGNOSIS — R2681 Unsteadiness on feet: Secondary | ICD-10-CM | POA: Diagnosis not present

## 2016-02-27 DIAGNOSIS — C9202 Acute myeloblastic leukemia, in relapse: Secondary | ICD-10-CM | POA: Diagnosis not present

## 2016-02-27 DIAGNOSIS — F4310 Post-traumatic stress disorder, unspecified: Secondary | ICD-10-CM | POA: Diagnosis not present

## 2016-02-27 DIAGNOSIS — E785 Hyperlipidemia, unspecified: Secondary | ICD-10-CM | POA: Diagnosis not present

## 2016-02-27 DIAGNOSIS — I10 Essential (primary) hypertension: Secondary | ICD-10-CM | POA: Diagnosis not present

## 2016-02-27 DIAGNOSIS — I4891 Unspecified atrial fibrillation: Secondary | ICD-10-CM | POA: Diagnosis not present

## 2016-02-27 DIAGNOSIS — D709 Neutropenia, unspecified: Secondary | ICD-10-CM | POA: Diagnosis not present

## 2016-02-27 DIAGNOSIS — E119 Type 2 diabetes mellitus without complications: Secondary | ICD-10-CM | POA: Diagnosis not present

## 2016-02-27 DIAGNOSIS — K5900 Constipation, unspecified: Secondary | ICD-10-CM | POA: Diagnosis not present

## 2016-02-27 DIAGNOSIS — R21 Rash and other nonspecific skin eruption: Secondary | ICD-10-CM | POA: Diagnosis not present

## 2016-02-27 DIAGNOSIS — Z7984 Long term (current) use of oral hypoglycemic drugs: Secondary | ICD-10-CM | POA: Diagnosis not present

## 2016-02-27 DIAGNOSIS — Z5111 Encounter for antineoplastic chemotherapy: Secondary | ICD-10-CM | POA: Diagnosis not present

## 2016-02-27 DIAGNOSIS — I5040 Unspecified combined systolic (congestive) and diastolic (congestive) heart failure: Secondary | ICD-10-CM | POA: Diagnosis not present

## 2016-02-27 DIAGNOSIS — R42 Dizziness and giddiness: Secondary | ICD-10-CM | POA: Diagnosis not present

## 2016-02-28 DIAGNOSIS — Z79899 Other long term (current) drug therapy: Secondary | ICD-10-CM | POA: Diagnosis not present

## 2016-02-28 DIAGNOSIS — C9202 Acute myeloblastic leukemia, in relapse: Secondary | ICD-10-CM | POA: Diagnosis not present

## 2016-02-28 DIAGNOSIS — Z5111 Encounter for antineoplastic chemotherapy: Secondary | ICD-10-CM | POA: Diagnosis not present

## 2016-02-29 DIAGNOSIS — Z5111 Encounter for antineoplastic chemotherapy: Secondary | ICD-10-CM | POA: Diagnosis not present

## 2016-02-29 DIAGNOSIS — C9202 Acute myeloblastic leukemia, in relapse: Secondary | ICD-10-CM | POA: Diagnosis not present

## 2016-03-01 ENCOUNTER — Encounter: Payer: Self-pay | Admitting: Internal Medicine

## 2016-03-05 DIAGNOSIS — R110 Nausea: Secondary | ICD-10-CM | POA: Diagnosis not present

## 2016-03-05 DIAGNOSIS — E785 Hyperlipidemia, unspecified: Secondary | ICD-10-CM | POA: Diagnosis not present

## 2016-03-05 DIAGNOSIS — C9202 Acute myeloblastic leukemia, in relapse: Secondary | ICD-10-CM | POA: Diagnosis not present

## 2016-03-05 DIAGNOSIS — I5040 Unspecified combined systolic (congestive) and diastolic (congestive) heart failure: Secondary | ICD-10-CM | POA: Diagnosis not present

## 2016-03-05 DIAGNOSIS — Z7982 Long term (current) use of aspirin: Secondary | ICD-10-CM | POA: Diagnosis not present

## 2016-03-05 DIAGNOSIS — I10 Essential (primary) hypertension: Secondary | ICD-10-CM | POA: Diagnosis not present

## 2016-03-05 DIAGNOSIS — I110 Hypertensive heart disease with heart failure: Secondary | ICD-10-CM | POA: Diagnosis not present

## 2016-03-05 DIAGNOSIS — K5900 Constipation, unspecified: Secondary | ICD-10-CM | POA: Diagnosis not present

## 2016-03-05 DIAGNOSIS — Z8619 Personal history of other infectious and parasitic diseases: Secondary | ICD-10-CM | POA: Diagnosis not present

## 2016-03-05 DIAGNOSIS — R21 Rash and other nonspecific skin eruption: Secondary | ICD-10-CM | POA: Diagnosis not present

## 2016-03-05 DIAGNOSIS — G4709 Other insomnia: Secondary | ICD-10-CM | POA: Diagnosis not present

## 2016-03-05 DIAGNOSIS — D708 Other neutropenia: Secondary | ICD-10-CM | POA: Diagnosis not present

## 2016-03-05 DIAGNOSIS — I4891 Unspecified atrial fibrillation: Secondary | ICD-10-CM | POA: Diagnosis not present

## 2016-03-05 DIAGNOSIS — E119 Type 2 diabetes mellitus without complications: Secondary | ICD-10-CM | POA: Diagnosis not present

## 2016-03-05 DIAGNOSIS — D709 Neutropenia, unspecified: Secondary | ICD-10-CM | POA: Diagnosis not present

## 2016-03-05 DIAGNOSIS — Z9221 Personal history of antineoplastic chemotherapy: Secondary | ICD-10-CM | POA: Diagnosis not present

## 2016-03-05 DIAGNOSIS — F4310 Post-traumatic stress disorder, unspecified: Secondary | ICD-10-CM | POA: Diagnosis not present

## 2016-03-05 DIAGNOSIS — Z7984 Long term (current) use of oral hypoglycemic drugs: Secondary | ICD-10-CM | POA: Diagnosis not present

## 2016-03-05 DIAGNOSIS — R7881 Bacteremia: Secondary | ICD-10-CM | POA: Diagnosis not present

## 2016-03-05 DIAGNOSIS — M109 Gout, unspecified: Secondary | ICD-10-CM | POA: Diagnosis not present

## 2016-03-05 DIAGNOSIS — Z79899 Other long term (current) drug therapy: Secondary | ICD-10-CM | POA: Diagnosis not present

## 2016-03-05 DIAGNOSIS — D696 Thrombocytopenia, unspecified: Secondary | ICD-10-CM | POA: Diagnosis not present

## 2016-03-09 DIAGNOSIS — C9202 Acute myeloblastic leukemia, in relapse: Secondary | ICD-10-CM | POA: Diagnosis not present

## 2016-03-09 DIAGNOSIS — E119 Type 2 diabetes mellitus without complications: Secondary | ICD-10-CM | POA: Diagnosis not present

## 2016-03-09 DIAGNOSIS — I10 Essential (primary) hypertension: Secondary | ICD-10-CM | POA: Diagnosis not present

## 2016-03-09 DIAGNOSIS — Z9889 Other specified postprocedural states: Secondary | ICD-10-CM | POA: Diagnosis not present

## 2016-03-09 DIAGNOSIS — Z7982 Long term (current) use of aspirin: Secondary | ICD-10-CM | POA: Diagnosis not present

## 2016-03-09 DIAGNOSIS — Z79899 Other long term (current) drug therapy: Secondary | ICD-10-CM | POA: Diagnosis not present

## 2016-03-12 DIAGNOSIS — I10 Essential (primary) hypertension: Secondary | ICD-10-CM | POA: Diagnosis not present

## 2016-03-12 DIAGNOSIS — F4312 Post-traumatic stress disorder, chronic: Secondary | ICD-10-CM | POA: Diagnosis not present

## 2016-03-12 DIAGNOSIS — I4891 Unspecified atrial fibrillation: Secondary | ICD-10-CM | POA: Diagnosis not present

## 2016-03-12 DIAGNOSIS — I5042 Chronic combined systolic (congestive) and diastolic (congestive) heart failure: Secondary | ICD-10-CM | POA: Diagnosis not present

## 2016-03-12 DIAGNOSIS — Z7982 Long term (current) use of aspirin: Secondary | ICD-10-CM | POA: Diagnosis not present

## 2016-03-12 DIAGNOSIS — D709 Neutropenia, unspecified: Secondary | ICD-10-CM | POA: Diagnosis not present

## 2016-03-12 DIAGNOSIS — E119 Type 2 diabetes mellitus without complications: Secondary | ICD-10-CM | POA: Diagnosis not present

## 2016-03-12 DIAGNOSIS — I110 Hypertensive heart disease with heart failure: Secondary | ICD-10-CM | POA: Diagnosis not present

## 2016-03-12 DIAGNOSIS — F4310 Post-traumatic stress disorder, unspecified: Secondary | ICD-10-CM | POA: Diagnosis not present

## 2016-03-12 DIAGNOSIS — I5040 Unspecified combined systolic (congestive) and diastolic (congestive) heart failure: Secondary | ICD-10-CM | POA: Diagnosis not present

## 2016-03-12 DIAGNOSIS — E785 Hyperlipidemia, unspecified: Secondary | ICD-10-CM | POA: Diagnosis not present

## 2016-03-12 DIAGNOSIS — M109 Gout, unspecified: Secondary | ICD-10-CM | POA: Diagnosis not present

## 2016-03-12 DIAGNOSIS — C9202 Acute myeloblastic leukemia, in relapse: Secondary | ICD-10-CM | POA: Diagnosis not present

## 2016-03-12 DIAGNOSIS — Z7984 Long term (current) use of oral hypoglycemic drugs: Secondary | ICD-10-CM | POA: Diagnosis not present

## 2016-03-12 DIAGNOSIS — Z79899 Other long term (current) drug therapy: Secondary | ICD-10-CM | POA: Diagnosis not present

## 2016-03-12 DIAGNOSIS — D61818 Other pancytopenia: Secondary | ICD-10-CM | POA: Diagnosis not present

## 2016-03-16 DIAGNOSIS — C9202 Acute myeloblastic leukemia, in relapse: Secondary | ICD-10-CM | POA: Diagnosis not present

## 2016-03-16 DIAGNOSIS — I5032 Chronic diastolic (congestive) heart failure: Secondary | ICD-10-CM | POA: Diagnosis not present

## 2016-03-16 DIAGNOSIS — Z6836 Body mass index (BMI) 36.0-36.9, adult: Secondary | ICD-10-CM | POA: Diagnosis not present

## 2016-03-16 DIAGNOSIS — I10 Essential (primary) hypertension: Secondary | ICD-10-CM | POA: Diagnosis not present

## 2016-03-16 DIAGNOSIS — Z7984 Long term (current) use of oral hypoglycemic drugs: Secondary | ICD-10-CM | POA: Diagnosis not present

## 2016-03-16 DIAGNOSIS — F4312 Post-traumatic stress disorder, chronic: Secondary | ICD-10-CM | POA: Diagnosis not present

## 2016-03-16 DIAGNOSIS — I110 Hypertensive heart disease with heart failure: Secondary | ICD-10-CM | POA: Diagnosis not present

## 2016-03-16 DIAGNOSIS — I4891 Unspecified atrial fibrillation: Secondary | ICD-10-CM | POA: Diagnosis not present

## 2016-03-16 DIAGNOSIS — G4700 Insomnia, unspecified: Secondary | ICD-10-CM | POA: Diagnosis not present

## 2016-03-16 DIAGNOSIS — I5022 Chronic systolic (congestive) heart failure: Secondary | ICD-10-CM | POA: Diagnosis not present

## 2016-03-16 DIAGNOSIS — Z7982 Long term (current) use of aspirin: Secondary | ICD-10-CM | POA: Diagnosis not present

## 2016-03-16 DIAGNOSIS — I5040 Unspecified combined systolic (congestive) and diastolic (congestive) heart failure: Secondary | ICD-10-CM | POA: Diagnosis not present

## 2016-03-16 DIAGNOSIS — D709 Neutropenia, unspecified: Secondary | ICD-10-CM | POA: Diagnosis not present

## 2016-03-16 DIAGNOSIS — E119 Type 2 diabetes mellitus without complications: Secondary | ICD-10-CM | POA: Diagnosis not present

## 2016-03-16 DIAGNOSIS — E785 Hyperlipidemia, unspecified: Secondary | ICD-10-CM | POA: Diagnosis not present

## 2016-03-19 DIAGNOSIS — Z79899 Other long term (current) drug therapy: Secondary | ICD-10-CM | POA: Diagnosis not present

## 2016-03-19 DIAGNOSIS — C9202 Acute myeloblastic leukemia, in relapse: Secondary | ICD-10-CM | POA: Diagnosis not present

## 2016-03-19 DIAGNOSIS — Z5111 Encounter for antineoplastic chemotherapy: Secondary | ICD-10-CM | POA: Diagnosis not present

## 2016-03-20 DIAGNOSIS — C9202 Acute myeloblastic leukemia, in relapse: Secondary | ICD-10-CM | POA: Diagnosis not present

## 2016-03-20 DIAGNOSIS — Z79899 Other long term (current) drug therapy: Secondary | ICD-10-CM | POA: Diagnosis not present

## 2016-03-20 DIAGNOSIS — Z5111 Encounter for antineoplastic chemotherapy: Secondary | ICD-10-CM | POA: Diagnosis not present

## 2016-03-21 DIAGNOSIS — Z79899 Other long term (current) drug therapy: Secondary | ICD-10-CM | POA: Diagnosis not present

## 2016-03-21 DIAGNOSIS — C9202 Acute myeloblastic leukemia, in relapse: Secondary | ICD-10-CM | POA: Diagnosis not present

## 2016-03-21 DIAGNOSIS — Z5111 Encounter for antineoplastic chemotherapy: Secondary | ICD-10-CM | POA: Diagnosis not present

## 2016-03-22 DIAGNOSIS — C9202 Acute myeloblastic leukemia, in relapse: Secondary | ICD-10-CM | POA: Diagnosis not present

## 2016-03-22 DIAGNOSIS — Z79899 Other long term (current) drug therapy: Secondary | ICD-10-CM | POA: Diagnosis not present

## 2016-03-22 DIAGNOSIS — Z5111 Encounter for antineoplastic chemotherapy: Secondary | ICD-10-CM | POA: Diagnosis not present

## 2016-03-23 DIAGNOSIS — E119 Type 2 diabetes mellitus without complications: Secondary | ICD-10-CM | POA: Diagnosis not present

## 2016-03-23 DIAGNOSIS — Z5111 Encounter for antineoplastic chemotherapy: Secondary | ICD-10-CM | POA: Diagnosis not present

## 2016-03-23 DIAGNOSIS — Z794 Long term (current) use of insulin: Secondary | ICD-10-CM | POA: Diagnosis not present

## 2016-03-23 DIAGNOSIS — D696 Thrombocytopenia, unspecified: Secondary | ICD-10-CM | POA: Diagnosis not present

## 2016-03-23 DIAGNOSIS — Z9221 Personal history of antineoplastic chemotherapy: Secondary | ICD-10-CM | POA: Diagnosis not present

## 2016-03-23 DIAGNOSIS — Z7982 Long term (current) use of aspirin: Secondary | ICD-10-CM | POA: Diagnosis not present

## 2016-03-23 DIAGNOSIS — Z79899 Other long term (current) drug therapy: Secondary | ICD-10-CM | POA: Diagnosis not present

## 2016-03-23 DIAGNOSIS — I10 Essential (primary) hypertension: Secondary | ICD-10-CM | POA: Diagnosis not present

## 2016-03-23 DIAGNOSIS — C9202 Acute myeloblastic leukemia, in relapse: Secondary | ICD-10-CM | POA: Diagnosis not present

## 2016-03-23 DIAGNOSIS — Z888 Allergy status to other drugs, medicaments and biological substances status: Secondary | ICD-10-CM | POA: Diagnosis not present

## 2016-03-23 DIAGNOSIS — Z923 Personal history of irradiation: Secondary | ICD-10-CM | POA: Diagnosis not present

## 2016-03-24 DIAGNOSIS — Z79899 Other long term (current) drug therapy: Secondary | ICD-10-CM | POA: Diagnosis not present

## 2016-03-24 DIAGNOSIS — Z5111 Encounter for antineoplastic chemotherapy: Secondary | ICD-10-CM | POA: Diagnosis not present

## 2016-03-24 DIAGNOSIS — C9202 Acute myeloblastic leukemia, in relapse: Secondary | ICD-10-CM | POA: Diagnosis not present

## 2016-03-25 DIAGNOSIS — C9202 Acute myeloblastic leukemia, in relapse: Secondary | ICD-10-CM | POA: Diagnosis not present

## 2016-03-25 DIAGNOSIS — Z79899 Other long term (current) drug therapy: Secondary | ICD-10-CM | POA: Diagnosis not present

## 2016-03-25 DIAGNOSIS — Z5111 Encounter for antineoplastic chemotherapy: Secondary | ICD-10-CM | POA: Diagnosis not present

## 2016-03-29 IMAGING — DX DG CHEST 1V PORT
1 series · 1 of 1 positions shown · non-contrast
Comparison: None.

CLINICAL DATA: Intubation.

EXAM:
PORTABLE CHEST - 1 VIEW

[chest ap]
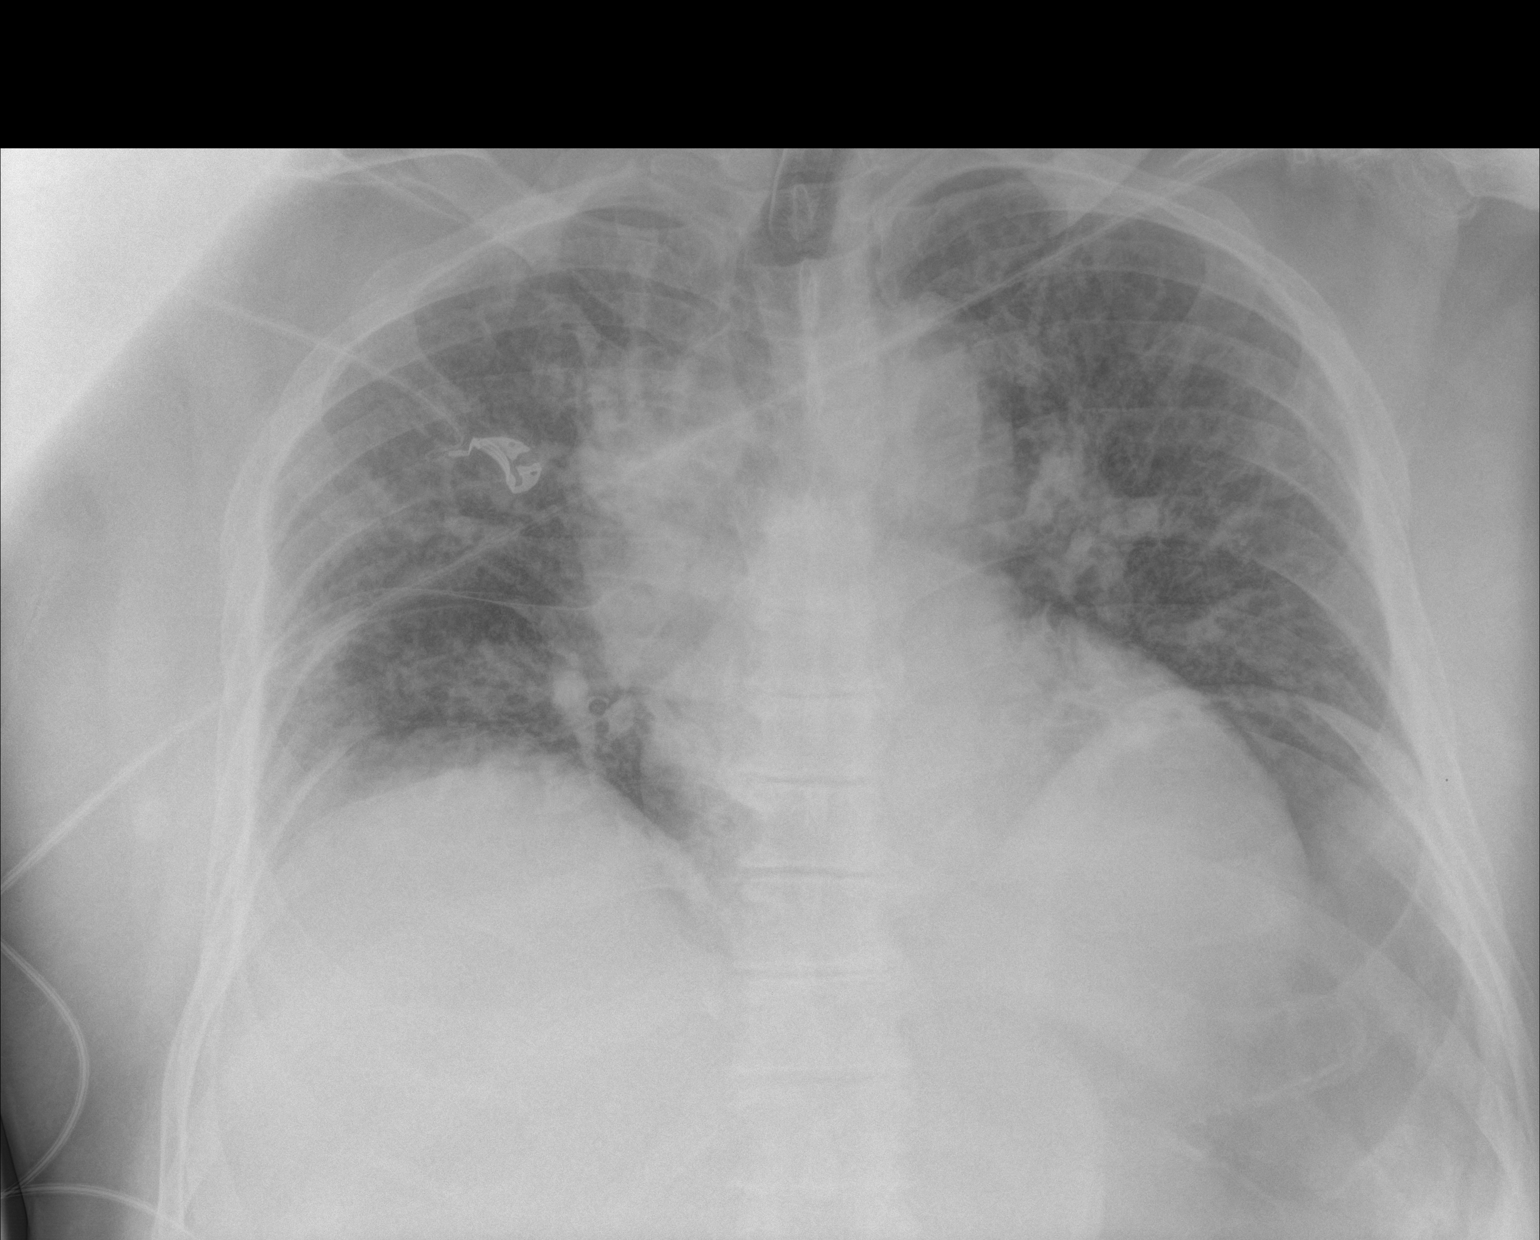

[1 of 1 positions shown; findings below may reference images not displayed]

FINDINGS: The heart size is exaggerated by lordotic view and decreased lung
volumes. And endotracheal tube is now in place. This terminates
cm above the carina and above the level clavicles. Interstitial
edema is increasing. No focal airspace consolidation is evident.
There is no pneumothorax.
IMPRESSION: 1. The endotracheal tube terminates 6.7 cm above the carina and
could be advanced for more optimal positioning.
2. Increasing interstitial edema.
3. Decreasing lung volumes.
These results were called by telephone at the time of interpretation
on 12/27/2014 at [DATE] to Dr. TAZANU MECHANE , who verbally
acknowledged these results.

## 2016-03-30 DIAGNOSIS — C9202 Acute myeloblastic leukemia, in relapse: Secondary | ICD-10-CM | POA: Diagnosis not present

## 2016-03-30 DIAGNOSIS — Z9221 Personal history of antineoplastic chemotherapy: Secondary | ICD-10-CM | POA: Diagnosis not present

## 2016-03-30 DIAGNOSIS — F4310 Post-traumatic stress disorder, unspecified: Secondary | ICD-10-CM | POA: Diagnosis not present

## 2016-03-30 DIAGNOSIS — K5900 Constipation, unspecified: Secondary | ICD-10-CM | POA: Diagnosis not present

## 2016-03-30 DIAGNOSIS — I4891 Unspecified atrial fibrillation: Secondary | ICD-10-CM | POA: Diagnosis not present

## 2016-03-30 DIAGNOSIS — I110 Hypertensive heart disease with heart failure: Secondary | ICD-10-CM | POA: Diagnosis not present

## 2016-03-30 DIAGNOSIS — G4700 Insomnia, unspecified: Secondary | ICD-10-CM | POA: Diagnosis not present

## 2016-03-30 DIAGNOSIS — E119 Type 2 diabetes mellitus without complications: Secondary | ICD-10-CM | POA: Diagnosis not present

## 2016-03-30 DIAGNOSIS — I5042 Chronic combined systolic (congestive) and diastolic (congestive) heart failure: Secondary | ICD-10-CM | POA: Diagnosis not present

## 2016-03-30 DIAGNOSIS — Z881 Allergy status to other antibiotic agents status: Secondary | ICD-10-CM | POA: Diagnosis not present

## 2016-03-30 DIAGNOSIS — Z79899 Other long term (current) drug therapy: Secondary | ICD-10-CM | POA: Diagnosis not present

## 2016-03-30 DIAGNOSIS — Z7984 Long term (current) use of oral hypoglycemic drugs: Secondary | ICD-10-CM | POA: Diagnosis not present

## 2016-03-30 DIAGNOSIS — D696 Thrombocytopenia, unspecified: Secondary | ICD-10-CM | POA: Diagnosis not present

## 2016-03-30 DIAGNOSIS — E785 Hyperlipidemia, unspecified: Secondary | ICD-10-CM | POA: Diagnosis not present

## 2016-03-30 DIAGNOSIS — Z7982 Long term (current) use of aspirin: Secondary | ICD-10-CM | POA: Diagnosis not present

## 2016-04-02 DIAGNOSIS — C9202 Acute myeloblastic leukemia, in relapse: Secondary | ICD-10-CM | POA: Diagnosis not present

## 2016-04-02 DIAGNOSIS — E559 Vitamin D deficiency, unspecified: Secondary | ICD-10-CM | POA: Diagnosis not present

## 2016-04-02 DIAGNOSIS — K5900 Constipation, unspecified: Secondary | ICD-10-CM | POA: Diagnosis not present

## 2016-04-02 DIAGNOSIS — R110 Nausea: Secondary | ICD-10-CM | POA: Diagnosis not present

## 2016-04-02 DIAGNOSIS — F4310 Post-traumatic stress disorder, unspecified: Secondary | ICD-10-CM | POA: Diagnosis not present

## 2016-04-02 DIAGNOSIS — G4700 Insomnia, unspecified: Secondary | ICD-10-CM | POA: Diagnosis not present

## 2016-04-02 DIAGNOSIS — Z7984 Long term (current) use of oral hypoglycemic drugs: Secondary | ICD-10-CM | POA: Diagnosis not present

## 2016-04-02 DIAGNOSIS — Z7982 Long term (current) use of aspirin: Secondary | ICD-10-CM | POA: Diagnosis not present

## 2016-04-02 DIAGNOSIS — E785 Hyperlipidemia, unspecified: Secondary | ICD-10-CM | POA: Diagnosis not present

## 2016-04-02 DIAGNOSIS — D61818 Other pancytopenia: Secondary | ICD-10-CM | POA: Diagnosis not present

## 2016-04-02 DIAGNOSIS — E119 Type 2 diabetes mellitus without complications: Secondary | ICD-10-CM | POA: Diagnosis not present

## 2016-04-02 DIAGNOSIS — I10 Essential (primary) hypertension: Secondary | ICD-10-CM | POA: Diagnosis not present

## 2016-04-02 DIAGNOSIS — D709 Neutropenia, unspecified: Secondary | ICD-10-CM | POA: Diagnosis not present

## 2016-04-02 DIAGNOSIS — I5040 Unspecified combined systolic (congestive) and diastolic (congestive) heart failure: Secondary | ICD-10-CM | POA: Diagnosis not present

## 2016-04-06 DIAGNOSIS — Z7982 Long term (current) use of aspirin: Secondary | ICD-10-CM | POA: Diagnosis not present

## 2016-04-06 DIAGNOSIS — E119 Type 2 diabetes mellitus without complications: Secondary | ICD-10-CM | POA: Diagnosis not present

## 2016-04-06 DIAGNOSIS — E559 Vitamin D deficiency, unspecified: Secondary | ICD-10-CM | POA: Diagnosis not present

## 2016-04-06 DIAGNOSIS — Z6836 Body mass index (BMI) 36.0-36.9, adult: Secondary | ICD-10-CM | POA: Diagnosis not present

## 2016-04-06 DIAGNOSIS — K219 Gastro-esophageal reflux disease without esophagitis: Secondary | ICD-10-CM | POA: Diagnosis not present

## 2016-04-06 DIAGNOSIS — F4310 Post-traumatic stress disorder, unspecified: Secondary | ICD-10-CM | POA: Diagnosis not present

## 2016-04-06 DIAGNOSIS — I5040 Unspecified combined systolic (congestive) and diastolic (congestive) heart failure: Secondary | ICD-10-CM | POA: Diagnosis not present

## 2016-04-06 DIAGNOSIS — R21 Rash and other nonspecific skin eruption: Secondary | ICD-10-CM | POA: Diagnosis not present

## 2016-04-06 DIAGNOSIS — E785 Hyperlipidemia, unspecified: Secondary | ICD-10-CM | POA: Diagnosis not present

## 2016-04-06 DIAGNOSIS — C9202 Acute myeloblastic leukemia, in relapse: Secondary | ICD-10-CM | POA: Diagnosis not present

## 2016-04-06 DIAGNOSIS — D61818 Other pancytopenia: Secondary | ICD-10-CM | POA: Diagnosis not present

## 2016-04-06 DIAGNOSIS — R110 Nausea: Secondary | ICD-10-CM | POA: Diagnosis not present

## 2016-04-06 DIAGNOSIS — Z7984 Long term (current) use of oral hypoglycemic drugs: Secondary | ICD-10-CM | POA: Diagnosis not present

## 2016-04-06 DIAGNOSIS — I10 Essential (primary) hypertension: Secondary | ICD-10-CM | POA: Diagnosis not present

## 2016-04-06 DIAGNOSIS — K5900 Constipation, unspecified: Secondary | ICD-10-CM | POA: Diagnosis not present

## 2016-04-09 DIAGNOSIS — E119 Type 2 diabetes mellitus without complications: Secondary | ICD-10-CM | POA: Diagnosis not present

## 2016-04-09 DIAGNOSIS — E559 Vitamin D deficiency, unspecified: Secondary | ICD-10-CM | POA: Diagnosis not present

## 2016-04-09 DIAGNOSIS — Z7984 Long term (current) use of oral hypoglycemic drugs: Secondary | ICD-10-CM | POA: Diagnosis not present

## 2016-04-09 DIAGNOSIS — Z7982 Long term (current) use of aspirin: Secondary | ICD-10-CM | POA: Diagnosis not present

## 2016-04-09 DIAGNOSIS — I5042 Chronic combined systolic (congestive) and diastolic (congestive) heart failure: Secondary | ICD-10-CM | POA: Diagnosis not present

## 2016-04-09 DIAGNOSIS — I110 Hypertensive heart disease with heart failure: Secondary | ICD-10-CM | POA: Diagnosis not present

## 2016-04-09 DIAGNOSIS — I5040 Unspecified combined systolic (congestive) and diastolic (congestive) heart failure: Secondary | ICD-10-CM | POA: Diagnosis not present

## 2016-04-09 DIAGNOSIS — R21 Rash and other nonspecific skin eruption: Secondary | ICD-10-CM | POA: Diagnosis not present

## 2016-04-09 DIAGNOSIS — G4700 Insomnia, unspecified: Secondary | ICD-10-CM | POA: Diagnosis not present

## 2016-04-09 DIAGNOSIS — K5900 Constipation, unspecified: Secondary | ICD-10-CM | POA: Diagnosis not present

## 2016-04-09 DIAGNOSIS — I10 Essential (primary) hypertension: Secondary | ICD-10-CM | POA: Diagnosis not present

## 2016-04-09 DIAGNOSIS — C9202 Acute myeloblastic leukemia, in relapse: Secondary | ICD-10-CM | POA: Diagnosis not present

## 2016-04-09 DIAGNOSIS — E785 Hyperlipidemia, unspecified: Secondary | ICD-10-CM | POA: Diagnosis not present

## 2016-04-09 DIAGNOSIS — D709 Neutropenia, unspecified: Secondary | ICD-10-CM | POA: Diagnosis not present

## 2016-04-13 DIAGNOSIS — Z7982 Long term (current) use of aspirin: Secondary | ICD-10-CM | POA: Diagnosis not present

## 2016-04-13 DIAGNOSIS — D709 Neutropenia, unspecified: Secondary | ICD-10-CM | POA: Diagnosis not present

## 2016-04-13 DIAGNOSIS — Z79899 Other long term (current) drug therapy: Secondary | ICD-10-CM | POA: Diagnosis not present

## 2016-04-13 DIAGNOSIS — I5040 Unspecified combined systolic (congestive) and diastolic (congestive) heart failure: Secondary | ICD-10-CM | POA: Diagnosis not present

## 2016-04-13 DIAGNOSIS — R21 Rash and other nonspecific skin eruption: Secondary | ICD-10-CM | POA: Diagnosis not present

## 2016-04-13 DIAGNOSIS — C9202 Acute myeloblastic leukemia, in relapse: Secondary | ICD-10-CM | POA: Diagnosis not present

## 2016-04-13 DIAGNOSIS — E785 Hyperlipidemia, unspecified: Secondary | ICD-10-CM | POA: Diagnosis not present

## 2016-04-13 DIAGNOSIS — F4310 Post-traumatic stress disorder, unspecified: Secondary | ICD-10-CM | POA: Diagnosis not present

## 2016-04-13 DIAGNOSIS — E119 Type 2 diabetes mellitus without complications: Secondary | ICD-10-CM | POA: Diagnosis not present

## 2016-04-13 DIAGNOSIS — Z7984 Long term (current) use of oral hypoglycemic drugs: Secondary | ICD-10-CM | POA: Diagnosis not present

## 2016-04-13 DIAGNOSIS — K5900 Constipation, unspecified: Secondary | ICD-10-CM | POA: Diagnosis not present

## 2016-04-13 DIAGNOSIS — I110 Hypertensive heart disease with heart failure: Secondary | ICD-10-CM | POA: Diagnosis not present

## 2016-04-13 DIAGNOSIS — T451X5A Adverse effect of antineoplastic and immunosuppressive drugs, initial encounter: Secondary | ICD-10-CM | POA: Diagnosis not present

## 2016-04-13 DIAGNOSIS — G4700 Insomnia, unspecified: Secondary | ICD-10-CM | POA: Diagnosis not present

## 2016-04-13 DIAGNOSIS — E559 Vitamin D deficiency, unspecified: Secondary | ICD-10-CM | POA: Diagnosis not present

## 2016-04-13 DIAGNOSIS — I4891 Unspecified atrial fibrillation: Secondary | ICD-10-CM | POA: Diagnosis not present

## 2016-04-20 DIAGNOSIS — Z79899 Other long term (current) drug therapy: Secondary | ICD-10-CM | POA: Diagnosis not present

## 2016-04-20 DIAGNOSIS — C9202 Acute myeloblastic leukemia, in relapse: Secondary | ICD-10-CM | POA: Diagnosis not present

## 2016-04-20 DIAGNOSIS — Z5111 Encounter for antineoplastic chemotherapy: Secondary | ICD-10-CM | POA: Diagnosis not present

## 2016-04-21 DIAGNOSIS — C9202 Acute myeloblastic leukemia, in relapse: Secondary | ICD-10-CM | POA: Diagnosis not present

## 2016-04-22 DIAGNOSIS — Z79899 Other long term (current) drug therapy: Secondary | ICD-10-CM | POA: Diagnosis not present

## 2016-04-22 DIAGNOSIS — Z5111 Encounter for antineoplastic chemotherapy: Secondary | ICD-10-CM | POA: Diagnosis not present

## 2016-04-22 DIAGNOSIS — C9202 Acute myeloblastic leukemia, in relapse: Secondary | ICD-10-CM | POA: Diagnosis not present

## 2016-04-23 DIAGNOSIS — E119 Type 2 diabetes mellitus without complications: Secondary | ICD-10-CM | POA: Diagnosis not present

## 2016-04-23 DIAGNOSIS — F4310 Post-traumatic stress disorder, unspecified: Secondary | ICD-10-CM | POA: Diagnosis not present

## 2016-04-23 DIAGNOSIS — I4891 Unspecified atrial fibrillation: Secondary | ICD-10-CM | POA: Diagnosis not present

## 2016-04-23 DIAGNOSIS — C9202 Acute myeloblastic leukemia, in relapse: Secondary | ICD-10-CM | POA: Diagnosis not present

## 2016-04-23 DIAGNOSIS — E559 Vitamin D deficiency, unspecified: Secondary | ICD-10-CM | POA: Diagnosis not present

## 2016-04-23 DIAGNOSIS — E785 Hyperlipidemia, unspecified: Secondary | ICD-10-CM | POA: Diagnosis not present

## 2016-04-23 DIAGNOSIS — D709 Neutropenia, unspecified: Secondary | ICD-10-CM | POA: Diagnosis not present

## 2016-04-23 DIAGNOSIS — Z5111 Encounter for antineoplastic chemotherapy: Secondary | ICD-10-CM | POA: Diagnosis not present

## 2016-04-23 DIAGNOSIS — I10 Essential (primary) hypertension: Secondary | ICD-10-CM | POA: Diagnosis not present

## 2016-04-23 DIAGNOSIS — I5042 Chronic combined systolic (congestive) and diastolic (congestive) heart failure: Secondary | ICD-10-CM | POA: Diagnosis not present

## 2016-04-23 DIAGNOSIS — I5040 Unspecified combined systolic (congestive) and diastolic (congestive) heart failure: Secondary | ICD-10-CM | POA: Diagnosis not present

## 2016-04-23 DIAGNOSIS — G4700 Insomnia, unspecified: Secondary | ICD-10-CM | POA: Diagnosis not present

## 2016-04-23 DIAGNOSIS — Z7982 Long term (current) use of aspirin: Secondary | ICD-10-CM | POA: Diagnosis not present

## 2016-04-23 DIAGNOSIS — Z7984 Long term (current) use of oral hypoglycemic drugs: Secondary | ICD-10-CM | POA: Diagnosis not present

## 2016-04-23 DIAGNOSIS — I110 Hypertensive heart disease with heart failure: Secondary | ICD-10-CM | POA: Diagnosis not present

## 2016-04-23 DIAGNOSIS — R2681 Unsteadiness on feet: Secondary | ICD-10-CM | POA: Diagnosis not present

## 2016-04-23 DIAGNOSIS — R21 Rash and other nonspecific skin eruption: Secondary | ICD-10-CM | POA: Diagnosis not present

## 2016-04-23 DIAGNOSIS — Z79899 Other long term (current) drug therapy: Secondary | ICD-10-CM | POA: Diagnosis not present

## 2016-04-23 DIAGNOSIS — D61818 Other pancytopenia: Secondary | ICD-10-CM | POA: Diagnosis not present

## 2016-04-23 LAB — CBC AND DIFFERENTIAL
HEMATOCRIT: 28 % — AB (ref 41–53)
Hemoglobin: 9.5 g/dL — AB (ref 13.5–17.5)
NEUTROS ABS: 0 /uL
Platelets: 101 10*3/uL — AB (ref 150–399)
WBC: 0.9 10^3/mL

## 2016-04-23 LAB — HEPATIC FUNCTION PANEL
ALK PHOS: 66 U/L (ref 25–125)
ALT: 13 U/L (ref 10–40)
AST: 16 U/L (ref 14–40)
Bilirubin, Total: 0.6 mg/dL

## 2016-04-23 LAB — BASIC METABOLIC PANEL
BUN: 20 mg/dL (ref 4–21)
Creatinine: 1.2 mg/dL (ref 0.6–1.3)
Glucose: 145 mg/dL
Potassium: 4.2 mmol/L (ref 3.4–5.3)
SODIUM: 133 mmol/L — AB (ref 137–147)

## 2016-04-24 DIAGNOSIS — Z5111 Encounter for antineoplastic chemotherapy: Secondary | ICD-10-CM | POA: Diagnosis not present

## 2016-04-24 DIAGNOSIS — C9202 Acute myeloblastic leukemia, in relapse: Secondary | ICD-10-CM | POA: Diagnosis not present

## 2016-04-24 DIAGNOSIS — Z79899 Other long term (current) drug therapy: Secondary | ICD-10-CM | POA: Diagnosis not present

## 2016-04-25 DIAGNOSIS — C9202 Acute myeloblastic leukemia, in relapse: Secondary | ICD-10-CM | POA: Diagnosis not present

## 2016-04-25 DIAGNOSIS — Z79899 Other long term (current) drug therapy: Secondary | ICD-10-CM | POA: Diagnosis not present

## 2016-04-25 DIAGNOSIS — Z5111 Encounter for antineoplastic chemotherapy: Secondary | ICD-10-CM | POA: Diagnosis not present

## 2016-04-26 DIAGNOSIS — C9202 Acute myeloblastic leukemia, in relapse: Secondary | ICD-10-CM | POA: Diagnosis not present

## 2016-04-26 DIAGNOSIS — Z5111 Encounter for antineoplastic chemotherapy: Secondary | ICD-10-CM | POA: Diagnosis not present

## 2016-04-30 DIAGNOSIS — C9202 Acute myeloblastic leukemia, in relapse: Secondary | ICD-10-CM | POA: Diagnosis not present

## 2016-05-04 DIAGNOSIS — D709 Neutropenia, unspecified: Secondary | ICD-10-CM | POA: Diagnosis not present

## 2016-05-04 DIAGNOSIS — I110 Hypertensive heart disease with heart failure: Secondary | ICD-10-CM | POA: Diagnosis not present

## 2016-05-04 DIAGNOSIS — Z79899 Other long term (current) drug therapy: Secondary | ICD-10-CM | POA: Diagnosis not present

## 2016-05-04 DIAGNOSIS — M109 Gout, unspecified: Secondary | ICD-10-CM | POA: Diagnosis not present

## 2016-05-04 DIAGNOSIS — Z7982 Long term (current) use of aspirin: Secondary | ICD-10-CM | POA: Diagnosis not present

## 2016-05-04 DIAGNOSIS — I480 Paroxysmal atrial fibrillation: Secondary | ICD-10-CM | POA: Diagnosis not present

## 2016-05-04 DIAGNOSIS — I5040 Unspecified combined systolic (congestive) and diastolic (congestive) heart failure: Secondary | ICD-10-CM | POA: Diagnosis not present

## 2016-05-04 DIAGNOSIS — I4891 Unspecified atrial fibrillation: Secondary | ICD-10-CM | POA: Diagnosis not present

## 2016-05-04 DIAGNOSIS — Z7984 Long term (current) use of oral hypoglycemic drugs: Secondary | ICD-10-CM | POA: Diagnosis not present

## 2016-05-04 DIAGNOSIS — R110 Nausea: Secondary | ICD-10-CM | POA: Diagnosis not present

## 2016-05-04 DIAGNOSIS — R262 Difficulty in walking, not elsewhere classified: Secondary | ICD-10-CM | POA: Diagnosis not present

## 2016-05-04 DIAGNOSIS — E119 Type 2 diabetes mellitus without complications: Secondary | ICD-10-CM | POA: Diagnosis not present

## 2016-05-04 DIAGNOSIS — F4310 Post-traumatic stress disorder, unspecified: Secondary | ICD-10-CM | POA: Diagnosis not present

## 2016-05-04 DIAGNOSIS — R609 Edema, unspecified: Secondary | ICD-10-CM | POA: Diagnosis not present

## 2016-05-04 DIAGNOSIS — D696 Thrombocytopenia, unspecified: Secondary | ICD-10-CM | POA: Diagnosis not present

## 2016-05-04 DIAGNOSIS — C92Z2 Other myeloid leukemia, in relapse: Secondary | ICD-10-CM | POA: Diagnosis not present

## 2016-05-04 DIAGNOSIS — C9202 Acute myeloblastic leukemia, in relapse: Secondary | ICD-10-CM | POA: Diagnosis not present

## 2016-05-04 DIAGNOSIS — I10 Essential (primary) hypertension: Secondary | ICD-10-CM | POA: Diagnosis not present

## 2016-05-06 LAB — BASIC METABOLIC PANEL
BUN: 23 mg/dL — AB (ref 4–21)
Creatinine: 1.2 mg/dL (ref 0.6–1.3)
GLUCOSE: 106 mg/dL
Potassium: 4.2 mmol/L (ref 3.4–5.3)
Sodium: 134 mmol/L — AB (ref 137–147)

## 2016-05-06 LAB — CBC AND DIFFERENTIAL
HEMATOCRIT: 27 % — AB (ref 41–53)
Hemoglobin: 9.3 g/dL — AB (ref 13.5–17.5)
PLATELETS: 159 10*3/uL (ref 150–399)
WBC: 1.6 10*3/mL

## 2016-05-06 LAB — HEPATIC FUNCTION PANEL
ALT: 14 U/L (ref 10–40)
AST: 17 U/L (ref 14–40)
Alkaline Phosphatase: 77 U/L (ref 25–125)
BILIRUBIN, TOTAL: 0.5 mg/dL

## 2016-05-11 ENCOUNTER — Encounter: Payer: Self-pay | Admitting: Internal Medicine

## 2016-05-11 DIAGNOSIS — F4310 Post-traumatic stress disorder, unspecified: Secondary | ICD-10-CM | POA: Diagnosis not present

## 2016-05-11 DIAGNOSIS — E785 Hyperlipidemia, unspecified: Secondary | ICD-10-CM | POA: Diagnosis not present

## 2016-05-11 DIAGNOSIS — I10 Essential (primary) hypertension: Secondary | ICD-10-CM | POA: Diagnosis not present

## 2016-05-11 DIAGNOSIS — Z7982 Long term (current) use of aspirin: Secondary | ICD-10-CM | POA: Diagnosis not present

## 2016-05-11 DIAGNOSIS — R21 Rash and other nonspecific skin eruption: Secondary | ICD-10-CM | POA: Diagnosis not present

## 2016-05-11 DIAGNOSIS — E119 Type 2 diabetes mellitus without complications: Secondary | ICD-10-CM | POA: Diagnosis not present

## 2016-05-11 DIAGNOSIS — Z7984 Long term (current) use of oral hypoglycemic drugs: Secondary | ICD-10-CM | POA: Diagnosis not present

## 2016-05-11 DIAGNOSIS — C92Z2 Other myeloid leukemia, in relapse: Secondary | ICD-10-CM | POA: Diagnosis not present

## 2016-05-11 DIAGNOSIS — E559 Vitamin D deficiency, unspecified: Secondary | ICD-10-CM | POA: Diagnosis not present

## 2016-05-11 DIAGNOSIS — C9202 Acute myeloblastic leukemia, in relapse: Secondary | ICD-10-CM | POA: Diagnosis not present

## 2016-05-11 DIAGNOSIS — I5040 Unspecified combined systolic (congestive) and diastolic (congestive) heart failure: Secondary | ICD-10-CM | POA: Diagnosis not present

## 2016-05-11 DIAGNOSIS — Z79899 Other long term (current) drug therapy: Secondary | ICD-10-CM | POA: Diagnosis not present

## 2016-05-11 DIAGNOSIS — I110 Hypertensive heart disease with heart failure: Secondary | ICD-10-CM | POA: Diagnosis not present

## 2016-05-11 DIAGNOSIS — D61818 Other pancytopenia: Secondary | ICD-10-CM | POA: Diagnosis not present

## 2016-05-11 DIAGNOSIS — D709 Neutropenia, unspecified: Secondary | ICD-10-CM | POA: Diagnosis not present

## 2016-05-18 ENCOUNTER — Encounter: Payer: Self-pay | Admitting: Internal Medicine

## 2016-05-18 DIAGNOSIS — Z7982 Long term (current) use of aspirin: Secondary | ICD-10-CM | POA: Diagnosis not present

## 2016-05-18 DIAGNOSIS — Z23 Encounter for immunization: Secondary | ICD-10-CM | POA: Diagnosis not present

## 2016-05-18 DIAGNOSIS — Z9221 Personal history of antineoplastic chemotherapy: Secondary | ICD-10-CM | POA: Diagnosis not present

## 2016-05-18 DIAGNOSIS — Z79899 Other long term (current) drug therapy: Secondary | ICD-10-CM | POA: Diagnosis not present

## 2016-05-18 DIAGNOSIS — C9202 Acute myeloblastic leukemia, in relapse: Secondary | ICD-10-CM | POA: Diagnosis not present

## 2016-05-18 DIAGNOSIS — M109 Gout, unspecified: Secondary | ICD-10-CM | POA: Diagnosis not present

## 2016-05-18 DIAGNOSIS — Z923 Personal history of irradiation: Secondary | ICD-10-CM | POA: Diagnosis not present

## 2016-05-18 DIAGNOSIS — Z5111 Encounter for antineoplastic chemotherapy: Secondary | ICD-10-CM | POA: Diagnosis not present

## 2016-05-18 DIAGNOSIS — Z7984 Long term (current) use of oral hypoglycemic drugs: Secondary | ICD-10-CM | POA: Diagnosis not present

## 2016-05-18 DIAGNOSIS — I10 Essential (primary) hypertension: Secondary | ICD-10-CM | POA: Diagnosis not present

## 2016-05-18 DIAGNOSIS — E119 Type 2 diabetes mellitus without complications: Secondary | ICD-10-CM | POA: Diagnosis not present

## 2016-05-18 LAB — BASIC METABOLIC PANEL
BUN: 27 mg/dL — AB (ref 4–21)
CREATININE: 1.2 mg/dL (ref 0.6–1.3)
Glucose: 134 mg/dL
POTASSIUM: 4.2 mmol/L (ref 3.4–5.3)
SODIUM: 136 mmol/L — AB (ref 137–147)

## 2016-05-18 LAB — CBC AND DIFFERENTIAL
HCT: 28 % — AB (ref 41–53)
Hemoglobin: 9.6 g/dL — AB (ref 13.5–17.5)
Platelets: 279 10*3/uL (ref 150–399)
WBC: 1.2 10*3/mL

## 2016-05-18 LAB — HEPATIC FUNCTION PANEL
ALT: 15 U/L (ref 10–40)
AST: 19 U/L (ref 14–40)
Alkaline Phosphatase: 78 U/L (ref 25–125)
Bilirubin, Total: 0.8 mg/dL

## 2016-05-19 DIAGNOSIS — Z79899 Other long term (current) drug therapy: Secondary | ICD-10-CM | POA: Diagnosis not present

## 2016-05-19 DIAGNOSIS — C9202 Acute myeloblastic leukemia, in relapse: Secondary | ICD-10-CM | POA: Diagnosis not present

## 2016-05-19 DIAGNOSIS — Z5111 Encounter for antineoplastic chemotherapy: Secondary | ICD-10-CM | POA: Diagnosis not present

## 2016-05-20 DIAGNOSIS — C9202 Acute myeloblastic leukemia, in relapse: Secondary | ICD-10-CM | POA: Diagnosis not present

## 2016-05-20 DIAGNOSIS — Z5111 Encounter for antineoplastic chemotherapy: Secondary | ICD-10-CM | POA: Diagnosis not present

## 2016-05-20 DIAGNOSIS — Z79899 Other long term (current) drug therapy: Secondary | ICD-10-CM | POA: Diagnosis not present

## 2016-05-21 DIAGNOSIS — Z79899 Other long term (current) drug therapy: Secondary | ICD-10-CM | POA: Diagnosis not present

## 2016-05-21 DIAGNOSIS — Z5111 Encounter for antineoplastic chemotherapy: Secondary | ICD-10-CM | POA: Diagnosis not present

## 2016-05-21 DIAGNOSIS — E559 Vitamin D deficiency, unspecified: Secondary | ICD-10-CM | POA: Diagnosis not present

## 2016-05-21 DIAGNOSIS — I4891 Unspecified atrial fibrillation: Secondary | ICD-10-CM | POA: Diagnosis not present

## 2016-05-21 DIAGNOSIS — K5900 Constipation, unspecified: Secondary | ICD-10-CM | POA: Diagnosis not present

## 2016-05-21 DIAGNOSIS — C9202 Acute myeloblastic leukemia, in relapse: Secondary | ICD-10-CM | POA: Diagnosis not present

## 2016-05-21 DIAGNOSIS — F4310 Post-traumatic stress disorder, unspecified: Secondary | ICD-10-CM | POA: Diagnosis not present

## 2016-05-21 DIAGNOSIS — E785 Hyperlipidemia, unspecified: Secondary | ICD-10-CM | POA: Diagnosis not present

## 2016-05-21 DIAGNOSIS — I110 Hypertensive heart disease with heart failure: Secondary | ICD-10-CM | POA: Diagnosis not present

## 2016-05-21 DIAGNOSIS — Z7982 Long term (current) use of aspirin: Secondary | ICD-10-CM | POA: Diagnosis not present

## 2016-05-21 DIAGNOSIS — Z23 Encounter for immunization: Secondary | ICD-10-CM | POA: Diagnosis not present

## 2016-05-21 DIAGNOSIS — R21 Rash and other nonspecific skin eruption: Secondary | ICD-10-CM | POA: Diagnosis not present

## 2016-05-21 DIAGNOSIS — I5040 Unspecified combined systolic (congestive) and diastolic (congestive) heart failure: Secondary | ICD-10-CM | POA: Diagnosis not present

## 2016-05-21 DIAGNOSIS — E119 Type 2 diabetes mellitus without complications: Secondary | ICD-10-CM | POA: Diagnosis not present

## 2016-05-21 DIAGNOSIS — Z7984 Long term (current) use of oral hypoglycemic drugs: Secondary | ICD-10-CM | POA: Diagnosis not present

## 2016-05-21 DIAGNOSIS — G4700 Insomnia, unspecified: Secondary | ICD-10-CM | POA: Diagnosis not present

## 2016-05-21 LAB — CBC AND DIFFERENTIAL
HEMATOCRIT: 27 % — AB (ref 41–53)
Hemoglobin: 9.3 g/dL — AB (ref 13.5–17.5)
NEUTROS ABS: 0 /uL
Platelets: 252 10*3/uL (ref 150–399)
WBC: 0.9 10^3/mL

## 2016-05-21 LAB — BASIC METABOLIC PANEL
BUN: 24 mg/dL — AB (ref 4–21)
Creatinine: 1.1 mg/dL (ref 0.6–1.3)
GLUCOSE: 128 mg/dL
Potassium: 4.1 mmol/L (ref 3.4–5.3)
SODIUM: 136 mmol/L — AB (ref 137–147)

## 2016-05-21 LAB — HEPATIC FUNCTION PANEL
ALK PHOS: 73 U/L (ref 25–125)
ALT: 14 U/L (ref 10–40)
AST: 17 U/L (ref 14–40)
BILIRUBIN, TOTAL: 0.7 mg/dL

## 2016-05-22 DIAGNOSIS — C9202 Acute myeloblastic leukemia, in relapse: Secondary | ICD-10-CM | POA: Diagnosis not present

## 2016-05-22 DIAGNOSIS — Z79899 Other long term (current) drug therapy: Secondary | ICD-10-CM | POA: Diagnosis not present

## 2016-05-22 DIAGNOSIS — Z5111 Encounter for antineoplastic chemotherapy: Secondary | ICD-10-CM | POA: Diagnosis not present

## 2016-05-23 DIAGNOSIS — Z79899 Other long term (current) drug therapy: Secondary | ICD-10-CM | POA: Diagnosis not present

## 2016-05-23 DIAGNOSIS — C9202 Acute myeloblastic leukemia, in relapse: Secondary | ICD-10-CM | POA: Diagnosis not present

## 2016-05-23 DIAGNOSIS — Z5111 Encounter for antineoplastic chemotherapy: Secondary | ICD-10-CM | POA: Diagnosis not present

## 2016-05-24 DIAGNOSIS — Z79899 Other long term (current) drug therapy: Secondary | ICD-10-CM | POA: Diagnosis not present

## 2016-05-24 DIAGNOSIS — C9202 Acute myeloblastic leukemia, in relapse: Secondary | ICD-10-CM | POA: Diagnosis not present

## 2016-05-24 DIAGNOSIS — Z5111 Encounter for antineoplastic chemotherapy: Secondary | ICD-10-CM | POA: Diagnosis not present

## 2016-05-28 DIAGNOSIS — Z7982 Long term (current) use of aspirin: Secondary | ICD-10-CM | POA: Diagnosis not present

## 2016-05-28 DIAGNOSIS — G4700 Insomnia, unspecified: Secondary | ICD-10-CM | POA: Diagnosis not present

## 2016-05-28 DIAGNOSIS — I5040 Unspecified combined systolic (congestive) and diastolic (congestive) heart failure: Secondary | ICD-10-CM | POA: Diagnosis not present

## 2016-05-28 DIAGNOSIS — E559 Vitamin D deficiency, unspecified: Secondary | ICD-10-CM | POA: Diagnosis not present

## 2016-05-28 DIAGNOSIS — F4310 Post-traumatic stress disorder, unspecified: Secondary | ICD-10-CM | POA: Diagnosis not present

## 2016-05-28 DIAGNOSIS — R110 Nausea: Secondary | ICD-10-CM | POA: Diagnosis not present

## 2016-05-28 DIAGNOSIS — K5900 Constipation, unspecified: Secondary | ICD-10-CM | POA: Diagnosis not present

## 2016-05-28 DIAGNOSIS — Z888 Allergy status to other drugs, medicaments and biological substances status: Secondary | ICD-10-CM | POA: Diagnosis not present

## 2016-05-28 DIAGNOSIS — Z95828 Presence of other vascular implants and grafts: Secondary | ICD-10-CM | POA: Diagnosis not present

## 2016-05-28 DIAGNOSIS — R21 Rash and other nonspecific skin eruption: Secondary | ICD-10-CM | POA: Diagnosis not present

## 2016-05-28 DIAGNOSIS — I110 Hypertensive heart disease with heart failure: Secondary | ICD-10-CM | POA: Diagnosis not present

## 2016-05-28 DIAGNOSIS — E785 Hyperlipidemia, unspecified: Secondary | ICD-10-CM | POA: Diagnosis not present

## 2016-05-28 DIAGNOSIS — D709 Neutropenia, unspecified: Secondary | ICD-10-CM | POA: Diagnosis not present

## 2016-05-28 DIAGNOSIS — D708 Other neutropenia: Secondary | ICD-10-CM | POA: Diagnosis not present

## 2016-05-28 DIAGNOSIS — Z9221 Personal history of antineoplastic chemotherapy: Secondary | ICD-10-CM | POA: Diagnosis not present

## 2016-05-28 DIAGNOSIS — E119 Type 2 diabetes mellitus without complications: Secondary | ICD-10-CM | POA: Diagnosis not present

## 2016-05-28 DIAGNOSIS — Z8739 Personal history of other diseases of the musculoskeletal system and connective tissue: Secondary | ICD-10-CM | POA: Diagnosis not present

## 2016-05-28 DIAGNOSIS — Z7984 Long term (current) use of oral hypoglycemic drugs: Secondary | ICD-10-CM | POA: Diagnosis not present

## 2016-05-28 DIAGNOSIS — Z79899 Other long term (current) drug therapy: Secondary | ICD-10-CM | POA: Diagnosis not present

## 2016-05-28 DIAGNOSIS — I4891 Unspecified atrial fibrillation: Secondary | ICD-10-CM | POA: Diagnosis not present

## 2016-05-28 DIAGNOSIS — C9202 Acute myeloblastic leukemia, in relapse: Secondary | ICD-10-CM | POA: Diagnosis not present

## 2016-05-28 LAB — CBC AND DIFFERENTIAL
HCT: 27 % — AB (ref 41–53)
Hemoglobin: 9 g/dL — AB (ref 13.5–17.5)
PLATELETS: 167 10*3/uL (ref 150–399)
WBC: 1.3 10*3/mL

## 2016-05-28 LAB — HEPATIC FUNCTION PANEL
ALK PHOS: 79 U/L (ref 25–125)
ALT: 14 U/L (ref 10–40)
AST: 17 U/L (ref 14–40)
BILIRUBIN, TOTAL: 0.7 mg/dL

## 2016-05-28 LAB — BASIC METABOLIC PANEL
BUN: 24 mg/dL — AB (ref 4–21)
CREATININE: 1.2 mg/dL (ref 0.6–1.3)
Glucose: 125 mg/dL
Potassium: 4.2 mmol/L (ref 3.4–5.3)
Sodium: 137 mmol/L (ref 137–147)

## 2016-05-31 ENCOUNTER — Encounter: Payer: Self-pay | Admitting: Internal Medicine

## 2016-06-02 ENCOUNTER — Encounter: Payer: Self-pay | Admitting: Internal Medicine

## 2016-06-04 DIAGNOSIS — R21 Rash and other nonspecific skin eruption: Secondary | ICD-10-CM | POA: Diagnosis not present

## 2016-06-04 DIAGNOSIS — E559 Vitamin D deficiency, unspecified: Secondary | ICD-10-CM | POA: Diagnosis not present

## 2016-06-04 DIAGNOSIS — F4310 Post-traumatic stress disorder, unspecified: Secondary | ICD-10-CM | POA: Diagnosis not present

## 2016-06-04 DIAGNOSIS — M109 Gout, unspecified: Secondary | ICD-10-CM | POA: Diagnosis not present

## 2016-06-04 DIAGNOSIS — C9202 Acute myeloblastic leukemia, in relapse: Secondary | ICD-10-CM | POA: Diagnosis not present

## 2016-06-04 DIAGNOSIS — Z7984 Long term (current) use of oral hypoglycemic drugs: Secondary | ICD-10-CM | POA: Diagnosis not present

## 2016-06-04 DIAGNOSIS — I4891 Unspecified atrial fibrillation: Secondary | ICD-10-CM | POA: Diagnosis not present

## 2016-06-04 DIAGNOSIS — Z79899 Other long term (current) drug therapy: Secondary | ICD-10-CM | POA: Diagnosis not present

## 2016-06-04 DIAGNOSIS — E119 Type 2 diabetes mellitus without complications: Secondary | ICD-10-CM | POA: Diagnosis not present

## 2016-06-04 DIAGNOSIS — I5042 Chronic combined systolic (congestive) and diastolic (congestive) heart failure: Secondary | ICD-10-CM | POA: Diagnosis not present

## 2016-06-04 DIAGNOSIS — Z9221 Personal history of antineoplastic chemotherapy: Secondary | ICD-10-CM | POA: Diagnosis not present

## 2016-06-04 DIAGNOSIS — I110 Hypertensive heart disease with heart failure: Secondary | ICD-10-CM | POA: Diagnosis not present

## 2016-06-04 DIAGNOSIS — E785 Hyperlipidemia, unspecified: Secondary | ICD-10-CM | POA: Diagnosis not present

## 2016-06-04 DIAGNOSIS — Z7982 Long term (current) use of aspirin: Secondary | ICD-10-CM | POA: Diagnosis not present

## 2016-06-04 LAB — HEPATIC FUNCTION PANEL
ALK PHOS: 75 U/L (ref 25–125)
ALT: 15 U/L (ref 10–40)
AST: 17 U/L (ref 14–40)
BILIRUBIN, TOTAL: 0.6 mg/dL

## 2016-06-04 LAB — CBC AND DIFFERENTIAL
HEMATOCRIT: 24 % — AB (ref 41–53)
HEMOGLOBIN: 8.4 g/dL — AB (ref 13.5–17.5)
PLATELETS: 105 10*3/uL — AB (ref 150–399)
WBC: 1.4 10^3/mL

## 2016-06-04 LAB — BASIC METABOLIC PANEL WITH GFR
BUN: 22 mg/dL — AB (ref 4–21)
Creatinine: 1.1 mg/dL (ref 0.6–1.3)
Glucose: 137 mg/dL
Potassium: 4.1 mmol/L (ref 3.4–5.3)
Sodium: 135 mmol/L — AB (ref 137–147)

## 2016-06-09 ENCOUNTER — Encounter: Payer: Self-pay | Admitting: Internal Medicine

## 2016-06-11 DIAGNOSIS — I110 Hypertensive heart disease with heart failure: Secondary | ICD-10-CM | POA: Diagnosis not present

## 2016-06-11 DIAGNOSIS — C9202 Acute myeloblastic leukemia, in relapse: Secondary | ICD-10-CM | POA: Diagnosis not present

## 2016-06-11 DIAGNOSIS — E119 Type 2 diabetes mellitus without complications: Secondary | ICD-10-CM | POA: Diagnosis not present

## 2016-06-11 DIAGNOSIS — Z7984 Long term (current) use of oral hypoglycemic drugs: Secondary | ICD-10-CM | POA: Diagnosis not present

## 2016-06-11 DIAGNOSIS — E785 Hyperlipidemia, unspecified: Secondary | ICD-10-CM | POA: Diagnosis not present

## 2016-06-11 DIAGNOSIS — R21 Rash and other nonspecific skin eruption: Secondary | ICD-10-CM | POA: Diagnosis not present

## 2016-06-11 DIAGNOSIS — K5900 Constipation, unspecified: Secondary | ICD-10-CM | POA: Diagnosis not present

## 2016-06-11 DIAGNOSIS — I5042 Chronic combined systolic (congestive) and diastolic (congestive) heart failure: Secondary | ICD-10-CM | POA: Diagnosis not present

## 2016-06-11 DIAGNOSIS — Z7982 Long term (current) use of aspirin: Secondary | ICD-10-CM | POA: Diagnosis not present

## 2016-06-11 DIAGNOSIS — F4310 Post-traumatic stress disorder, unspecified: Secondary | ICD-10-CM | POA: Diagnosis not present

## 2016-06-15 DIAGNOSIS — I4891 Unspecified atrial fibrillation: Secondary | ICD-10-CM | POA: Diagnosis not present

## 2016-06-15 DIAGNOSIS — E119 Type 2 diabetes mellitus without complications: Secondary | ICD-10-CM | POA: Diagnosis not present

## 2016-06-15 DIAGNOSIS — R21 Rash and other nonspecific skin eruption: Secondary | ICD-10-CM | POA: Diagnosis not present

## 2016-06-15 DIAGNOSIS — I110 Hypertensive heart disease with heart failure: Secondary | ICD-10-CM | POA: Diagnosis not present

## 2016-06-15 DIAGNOSIS — Z7984 Long term (current) use of oral hypoglycemic drugs: Secondary | ICD-10-CM | POA: Diagnosis not present

## 2016-06-15 DIAGNOSIS — C9202 Acute myeloblastic leukemia, in relapse: Secondary | ICD-10-CM | POA: Diagnosis not present

## 2016-06-15 DIAGNOSIS — I5040 Unspecified combined systolic (congestive) and diastolic (congestive) heart failure: Secondary | ICD-10-CM | POA: Diagnosis not present

## 2016-06-15 DIAGNOSIS — Z9889 Other specified postprocedural states: Secondary | ICD-10-CM | POA: Diagnosis not present

## 2016-06-15 DIAGNOSIS — Z7982 Long term (current) use of aspirin: Secondary | ICD-10-CM | POA: Diagnosis not present

## 2016-06-15 DIAGNOSIS — K5900 Constipation, unspecified: Secondary | ICD-10-CM | POA: Diagnosis not present

## 2016-06-15 DIAGNOSIS — D709 Neutropenia, unspecified: Secondary | ICD-10-CM | POA: Diagnosis not present

## 2016-06-15 DIAGNOSIS — R609 Edema, unspecified: Secondary | ICD-10-CM | POA: Diagnosis not present

## 2016-06-15 DIAGNOSIS — Z5111 Encounter for antineoplastic chemotherapy: Secondary | ICD-10-CM | POA: Diagnosis not present

## 2016-06-15 DIAGNOSIS — G4700 Insomnia, unspecified: Secondary | ICD-10-CM | POA: Diagnosis not present

## 2016-06-15 DIAGNOSIS — E785 Hyperlipidemia, unspecified: Secondary | ICD-10-CM | POA: Diagnosis not present

## 2016-06-15 DIAGNOSIS — F4310 Post-traumatic stress disorder, unspecified: Secondary | ICD-10-CM | POA: Diagnosis not present

## 2016-06-15 DIAGNOSIS — E559 Vitamin D deficiency, unspecified: Secondary | ICD-10-CM | POA: Diagnosis not present

## 2016-06-15 DIAGNOSIS — Z79899 Other long term (current) drug therapy: Secondary | ICD-10-CM | POA: Diagnosis not present

## 2016-06-15 LAB — HEPATIC FUNCTION PANEL
ALT: 14 U/L (ref 10–40)
AST: 17 U/L (ref 14–40)
Alkaline Phosphatase: 67 U/L (ref 25–125)
BILIRUBIN, TOTAL: 67 mg/dL

## 2016-06-15 LAB — CBC AND DIFFERENTIAL
HCT: 29 % — AB (ref 41–53)
HEMOGLOBIN: 9.8 g/dL — AB (ref 13.5–17.5)
Neutrophils Absolute: 0 /uL
PLATELETS: 244 10*3/uL (ref 150–399)
WBC: 1.2 10*3/mL

## 2016-06-15 LAB — BASIC METABOLIC PANEL
BUN: 22 mg/dL — AB (ref 4–21)
Creatinine: 1.1 mg/dL (ref 0.6–1.3)
Glucose: 152 mg/dL
Potassium: 4.1 mmol/L (ref 3.4–5.3)
Sodium: 136 mmol/L — AB (ref 137–147)

## 2016-06-16 DIAGNOSIS — Z5111 Encounter for antineoplastic chemotherapy: Secondary | ICD-10-CM | POA: Diagnosis not present

## 2016-06-16 DIAGNOSIS — C9202 Acute myeloblastic leukemia, in relapse: Secondary | ICD-10-CM | POA: Diagnosis not present

## 2016-06-17 DIAGNOSIS — Z79899 Other long term (current) drug therapy: Secondary | ICD-10-CM | POA: Diagnosis not present

## 2016-06-17 DIAGNOSIS — Z5111 Encounter for antineoplastic chemotherapy: Secondary | ICD-10-CM | POA: Diagnosis not present

## 2016-06-17 DIAGNOSIS — C9002 Multiple myeloma in relapse: Secondary | ICD-10-CM | POA: Diagnosis not present

## 2016-06-17 LAB — BASIC METABOLIC PANEL
BUN: 21 mg/dL (ref 4–21)
CREATININE: 1.2 mg/dL (ref 0.6–1.3)
GLUCOSE: 152 mg/dL
Potassium: 4.1 mmol/L (ref 3.4–5.3)
Sodium: 136 mmol/L — AB (ref 137–147)

## 2016-06-17 LAB — CBC AND DIFFERENTIAL
HCT: 28 % — AB (ref 41–53)
Hemoglobin: 9.5 g/dL — AB (ref 13.5–17.5)
NEUTROS ABS: 0 /uL
PLATELETS: 237 10*3/uL (ref 150–399)
WBC: 0.9 10*3/mL

## 2016-06-17 LAB — HEPATIC FUNCTION PANEL
ALT: 13 U/L (ref 10–40)
AST: 14 U/L (ref 14–40)
Alkaline Phosphatase: 68 U/L (ref 25–125)
BILIRUBIN, TOTAL: 0.6 mg/dL

## 2016-06-18 ENCOUNTER — Encounter: Payer: Self-pay | Admitting: Internal Medicine

## 2016-06-18 DIAGNOSIS — R110 Nausea: Secondary | ICD-10-CM | POA: Diagnosis not present

## 2016-06-18 DIAGNOSIS — I110 Hypertensive heart disease with heart failure: Secondary | ICD-10-CM | POA: Diagnosis not present

## 2016-06-18 DIAGNOSIS — Z5111 Encounter for antineoplastic chemotherapy: Secondary | ICD-10-CM | POA: Diagnosis not present

## 2016-06-18 DIAGNOSIS — Z7984 Long term (current) use of oral hypoglycemic drugs: Secondary | ICD-10-CM | POA: Diagnosis not present

## 2016-06-18 DIAGNOSIS — K5900 Constipation, unspecified: Secondary | ICD-10-CM | POA: Diagnosis not present

## 2016-06-18 DIAGNOSIS — C9202 Acute myeloblastic leukemia, in relapse: Secondary | ICD-10-CM | POA: Diagnosis not present

## 2016-06-18 DIAGNOSIS — E119 Type 2 diabetes mellitus without complications: Secondary | ICD-10-CM | POA: Diagnosis not present

## 2016-06-18 DIAGNOSIS — Z79899 Other long term (current) drug therapy: Secondary | ICD-10-CM | POA: Diagnosis not present

## 2016-06-18 DIAGNOSIS — I5040 Unspecified combined systolic (congestive) and diastolic (congestive) heart failure: Secondary | ICD-10-CM | POA: Diagnosis not present

## 2016-06-19 DIAGNOSIS — C9202 Acute myeloblastic leukemia, in relapse: Secondary | ICD-10-CM | POA: Diagnosis not present

## 2016-06-19 DIAGNOSIS — Z79899 Other long term (current) drug therapy: Secondary | ICD-10-CM | POA: Diagnosis not present

## 2016-06-19 DIAGNOSIS — Z5111 Encounter for antineoplastic chemotherapy: Secondary | ICD-10-CM | POA: Diagnosis not present

## 2016-06-20 DIAGNOSIS — C9202 Acute myeloblastic leukemia, in relapse: Secondary | ICD-10-CM | POA: Diagnosis not present

## 2016-06-20 DIAGNOSIS — Z5111 Encounter for antineoplastic chemotherapy: Secondary | ICD-10-CM | POA: Diagnosis not present

## 2016-06-20 DIAGNOSIS — Z79899 Other long term (current) drug therapy: Secondary | ICD-10-CM | POA: Diagnosis not present

## 2016-06-21 DIAGNOSIS — Z5111 Encounter for antineoplastic chemotherapy: Secondary | ICD-10-CM | POA: Diagnosis not present

## 2016-06-21 DIAGNOSIS — C9202 Acute myeloblastic leukemia, in relapse: Secondary | ICD-10-CM | POA: Diagnosis not present

## 2016-06-24 ENCOUNTER — Encounter: Payer: Self-pay | Admitting: Internal Medicine

## 2016-06-25 DIAGNOSIS — M8580 Other specified disorders of bone density and structure, unspecified site: Secondary | ICD-10-CM | POA: Diagnosis not present

## 2016-06-25 DIAGNOSIS — M25571 Pain in right ankle and joints of right foot: Secondary | ICD-10-CM | POA: Diagnosis not present

## 2016-06-25 DIAGNOSIS — C9200 Acute myeloblastic leukemia, not having achieved remission: Secondary | ICD-10-CM | POA: Diagnosis not present

## 2016-06-25 DIAGNOSIS — C9202 Acute myeloblastic leukemia, in relapse: Secondary | ICD-10-CM | POA: Diagnosis not present

## 2016-06-25 DIAGNOSIS — E119 Type 2 diabetes mellitus without complications: Secondary | ICD-10-CM | POA: Diagnosis not present

## 2016-06-25 DIAGNOSIS — D489 Neoplasm of uncertain behavior, unspecified: Secondary | ICD-10-CM | POA: Diagnosis not present

## 2016-06-25 DIAGNOSIS — I10 Essential (primary) hypertension: Secondary | ICD-10-CM | POA: Diagnosis not present

## 2016-06-25 DIAGNOSIS — Z87891 Personal history of nicotine dependence: Secondary | ICD-10-CM | POA: Diagnosis not present

## 2016-06-29 DIAGNOSIS — D61818 Other pancytopenia: Secondary | ICD-10-CM | POA: Diagnosis not present

## 2016-06-29 DIAGNOSIS — S91311D Laceration without foreign body, right foot, subsequent encounter: Secondary | ICD-10-CM | POA: Diagnosis not present

## 2016-06-29 DIAGNOSIS — Z79899 Other long term (current) drug therapy: Secondary | ICD-10-CM | POA: Diagnosis not present

## 2016-06-29 DIAGNOSIS — Z794 Long term (current) use of insulin: Secondary | ICD-10-CM | POA: Diagnosis not present

## 2016-06-29 DIAGNOSIS — Z7984 Long term (current) use of oral hypoglycemic drugs: Secondary | ICD-10-CM | POA: Diagnosis not present

## 2016-06-29 DIAGNOSIS — F4310 Post-traumatic stress disorder, unspecified: Secondary | ICD-10-CM | POA: Diagnosis present

## 2016-06-29 DIAGNOSIS — F329 Major depressive disorder, single episode, unspecified: Secondary | ICD-10-CM | POA: Diagnosis present

## 2016-06-29 DIAGNOSIS — Z8249 Family history of ischemic heart disease and other diseases of the circulatory system: Secondary | ICD-10-CM | POA: Diagnosis not present

## 2016-06-29 DIAGNOSIS — F419 Anxiety disorder, unspecified: Secondary | ICD-10-CM | POA: Diagnosis present

## 2016-06-29 DIAGNOSIS — Z888 Allergy status to other drugs, medicaments and biological substances status: Secondary | ICD-10-CM | POA: Diagnosis not present

## 2016-06-29 DIAGNOSIS — C9202 Acute myeloblastic leukemia, in relapse: Secondary | ICD-10-CM | POA: Diagnosis present

## 2016-06-29 DIAGNOSIS — K219 Gastro-esophageal reflux disease without esophagitis: Secondary | ICD-10-CM | POA: Diagnosis present

## 2016-06-29 DIAGNOSIS — I10 Essential (primary) hypertension: Secondary | ICD-10-CM | POA: Diagnosis present

## 2016-06-29 DIAGNOSIS — E119 Type 2 diabetes mellitus without complications: Secondary | ICD-10-CM | POA: Diagnosis present

## 2016-06-29 DIAGNOSIS — Z87891 Personal history of nicotine dependence: Secondary | ICD-10-CM | POA: Diagnosis not present

## 2016-06-29 DIAGNOSIS — Z833 Family history of diabetes mellitus: Secondary | ICD-10-CM | POA: Diagnosis not present

## 2016-06-29 DIAGNOSIS — D61810 Antineoplastic chemotherapy induced pancytopenia: Secondary | ICD-10-CM | POA: Diagnosis present

## 2016-06-29 DIAGNOSIS — Z7982 Long term (current) use of aspirin: Secondary | ICD-10-CM | POA: Diagnosis not present

## 2016-06-29 DIAGNOSIS — T451X5A Adverse effect of antineoplastic and immunosuppressive drugs, initial encounter: Secondary | ICD-10-CM | POA: Diagnosis present

## 2016-06-29 DIAGNOSIS — L03115 Cellulitis of right lower limb: Secondary | ICD-10-CM | POA: Diagnosis present

## 2016-06-29 DIAGNOSIS — D709 Neutropenia, unspecified: Secondary | ICD-10-CM | POA: Diagnosis not present

## 2016-07-01 ENCOUNTER — Encounter: Payer: Self-pay | Admitting: Internal Medicine

## 2016-07-06 DIAGNOSIS — I4891 Unspecified atrial fibrillation: Secondary | ICD-10-CM | POA: Diagnosis not present

## 2016-07-06 DIAGNOSIS — Z7982 Long term (current) use of aspirin: Secondary | ICD-10-CM | POA: Diagnosis not present

## 2016-07-06 DIAGNOSIS — M25571 Pain in right ankle and joints of right foot: Secondary | ICD-10-CM | POA: Diagnosis not present

## 2016-07-06 DIAGNOSIS — E119 Type 2 diabetes mellitus without complications: Secondary | ICD-10-CM | POA: Diagnosis not present

## 2016-07-06 DIAGNOSIS — E785 Hyperlipidemia, unspecified: Secondary | ICD-10-CM | POA: Diagnosis not present

## 2016-07-06 DIAGNOSIS — R110 Nausea: Secondary | ICD-10-CM | POA: Diagnosis not present

## 2016-07-06 DIAGNOSIS — I10 Essential (primary) hypertension: Secondary | ICD-10-CM | POA: Diagnosis not present

## 2016-07-06 DIAGNOSIS — Z7984 Long term (current) use of oral hypoglycemic drugs: Secondary | ICD-10-CM | POA: Diagnosis not present

## 2016-07-06 DIAGNOSIS — R21 Rash and other nonspecific skin eruption: Secondary | ICD-10-CM | POA: Diagnosis not present

## 2016-07-06 DIAGNOSIS — L03115 Cellulitis of right lower limb: Secondary | ICD-10-CM | POA: Diagnosis not present

## 2016-07-06 DIAGNOSIS — C9202 Acute myeloblastic leukemia, in relapse: Secondary | ICD-10-CM | POA: Diagnosis not present

## 2016-07-06 DIAGNOSIS — D72819 Decreased white blood cell count, unspecified: Secondary | ICD-10-CM | POA: Diagnosis not present

## 2016-07-07 DIAGNOSIS — Z79899 Other long term (current) drug therapy: Secondary | ICD-10-CM | POA: Diagnosis not present

## 2016-07-07 DIAGNOSIS — Z452 Encounter for adjustment and management of vascular access device: Secondary | ICD-10-CM | POA: Diagnosis not present

## 2016-07-07 DIAGNOSIS — L03115 Cellulitis of right lower limb: Secondary | ICD-10-CM | POA: Diagnosis not present

## 2016-07-07 DIAGNOSIS — C9202 Acute myeloblastic leukemia, in relapse: Secondary | ICD-10-CM | POA: Diagnosis not present

## 2016-07-09 DIAGNOSIS — C9202 Acute myeloblastic leukemia, in relapse: Secondary | ICD-10-CM | POA: Diagnosis not present

## 2016-07-09 DIAGNOSIS — L03115 Cellulitis of right lower limb: Secondary | ICD-10-CM | POA: Diagnosis not present

## 2016-07-12 DIAGNOSIS — L03115 Cellulitis of right lower limb: Secondary | ICD-10-CM | POA: Diagnosis not present

## 2016-07-12 DIAGNOSIS — C9202 Acute myeloblastic leukemia, in relapse: Secondary | ICD-10-CM | POA: Diagnosis not present

## 2016-07-13 DIAGNOSIS — Z7984 Long term (current) use of oral hypoglycemic drugs: Secondary | ICD-10-CM | POA: Diagnosis not present

## 2016-07-13 DIAGNOSIS — Z9221 Personal history of antineoplastic chemotherapy: Secondary | ICD-10-CM | POA: Diagnosis not present

## 2016-07-13 DIAGNOSIS — K5900 Constipation, unspecified: Secondary | ICD-10-CM | POA: Diagnosis not present

## 2016-07-13 DIAGNOSIS — E785 Hyperlipidemia, unspecified: Secondary | ICD-10-CM | POA: Diagnosis not present

## 2016-07-13 DIAGNOSIS — I110 Hypertensive heart disease with heart failure: Secondary | ICD-10-CM | POA: Diagnosis not present

## 2016-07-13 DIAGNOSIS — Z792 Long term (current) use of antibiotics: Secondary | ICD-10-CM | POA: Diagnosis not present

## 2016-07-13 DIAGNOSIS — I5040 Unspecified combined systolic (congestive) and diastolic (congestive) heart failure: Secondary | ICD-10-CM | POA: Diagnosis not present

## 2016-07-13 DIAGNOSIS — C9202 Acute myeloblastic leukemia, in relapse: Secondary | ICD-10-CM | POA: Diagnosis not present

## 2016-07-13 DIAGNOSIS — E559 Vitamin D deficiency, unspecified: Secondary | ICD-10-CM | POA: Diagnosis not present

## 2016-07-13 DIAGNOSIS — Z79899 Other long term (current) drug therapy: Secondary | ICD-10-CM | POA: Diagnosis not present

## 2016-07-13 DIAGNOSIS — D709 Neutropenia, unspecified: Secondary | ICD-10-CM | POA: Diagnosis not present

## 2016-07-13 DIAGNOSIS — I5042 Chronic combined systolic (congestive) and diastolic (congestive) heart failure: Secondary | ICD-10-CM | POA: Diagnosis not present

## 2016-07-13 DIAGNOSIS — F4310 Post-traumatic stress disorder, unspecified: Secondary | ICD-10-CM | POA: Diagnosis not present

## 2016-07-13 DIAGNOSIS — G4700 Insomnia, unspecified: Secondary | ICD-10-CM | POA: Diagnosis not present

## 2016-07-13 DIAGNOSIS — R110 Nausea: Secondary | ICD-10-CM | POA: Diagnosis not present

## 2016-07-13 DIAGNOSIS — R21 Rash and other nonspecific skin eruption: Secondary | ICD-10-CM | POA: Diagnosis not present

## 2016-07-13 DIAGNOSIS — I4891 Unspecified atrial fibrillation: Secondary | ICD-10-CM | POA: Diagnosis not present

## 2016-07-13 DIAGNOSIS — E119 Type 2 diabetes mellitus without complications: Secondary | ICD-10-CM | POA: Diagnosis not present

## 2016-07-13 DIAGNOSIS — Z7982 Long term (current) use of aspirin: Secondary | ICD-10-CM | POA: Diagnosis not present

## 2016-07-13 DIAGNOSIS — D703 Neutropenia due to infection: Secondary | ICD-10-CM | POA: Diagnosis not present

## 2016-07-13 DIAGNOSIS — L03115 Cellulitis of right lower limb: Secondary | ICD-10-CM | POA: Diagnosis not present

## 2016-07-14 DIAGNOSIS — C9202 Acute myeloblastic leukemia, in relapse: Secondary | ICD-10-CM | POA: Diagnosis not present

## 2016-07-14 DIAGNOSIS — Z5111 Encounter for antineoplastic chemotherapy: Secondary | ICD-10-CM | POA: Diagnosis not present

## 2016-07-15 DIAGNOSIS — Z5111 Encounter for antineoplastic chemotherapy: Secondary | ICD-10-CM | POA: Diagnosis not present

## 2016-07-15 DIAGNOSIS — L03115 Cellulitis of right lower limb: Secondary | ICD-10-CM | POA: Diagnosis not present

## 2016-07-15 DIAGNOSIS — C9202 Acute myeloblastic leukemia, in relapse: Secondary | ICD-10-CM | POA: Diagnosis not present

## 2016-07-16 DIAGNOSIS — E1159 Type 2 diabetes mellitus with other circulatory complications: Secondary | ICD-10-CM | POA: Diagnosis not present

## 2016-07-16 DIAGNOSIS — Z7982 Long term (current) use of aspirin: Secondary | ICD-10-CM | POA: Diagnosis not present

## 2016-07-16 DIAGNOSIS — E559 Vitamin D deficiency, unspecified: Secondary | ICD-10-CM | POA: Diagnosis not present

## 2016-07-16 DIAGNOSIS — M7752 Other enthesopathy of left foot: Secondary | ICD-10-CM | POA: Diagnosis not present

## 2016-07-16 DIAGNOSIS — K5900 Constipation, unspecified: Secondary | ICD-10-CM | POA: Diagnosis not present

## 2016-07-16 DIAGNOSIS — I5042 Chronic combined systolic (congestive) and diastolic (congestive) heart failure: Secondary | ICD-10-CM | POA: Diagnosis not present

## 2016-07-16 DIAGNOSIS — Z7984 Long term (current) use of oral hypoglycemic drugs: Secondary | ICD-10-CM | POA: Diagnosis not present

## 2016-07-16 DIAGNOSIS — F4310 Post-traumatic stress disorder, unspecified: Secondary | ICD-10-CM | POA: Diagnosis not present

## 2016-07-16 DIAGNOSIS — I4891 Unspecified atrial fibrillation: Secondary | ICD-10-CM | POA: Diagnosis not present

## 2016-07-16 DIAGNOSIS — E119 Type 2 diabetes mellitus without complications: Secondary | ICD-10-CM | POA: Diagnosis not present

## 2016-07-16 DIAGNOSIS — E785 Hyperlipidemia, unspecified: Secondary | ICD-10-CM | POA: Diagnosis not present

## 2016-07-16 DIAGNOSIS — Z5111 Encounter for antineoplastic chemotherapy: Secondary | ICD-10-CM | POA: Diagnosis not present

## 2016-07-16 DIAGNOSIS — Z79899 Other long term (current) drug therapy: Secondary | ICD-10-CM | POA: Diagnosis not present

## 2016-07-16 DIAGNOSIS — C9202 Acute myeloblastic leukemia, in relapse: Secondary | ICD-10-CM | POA: Diagnosis not present

## 2016-07-16 DIAGNOSIS — I110 Hypertensive heart disease with heart failure: Secondary | ICD-10-CM | POA: Diagnosis not present

## 2016-07-16 DIAGNOSIS — R21 Rash and other nonspecific skin eruption: Secondary | ICD-10-CM | POA: Diagnosis not present

## 2016-07-16 DIAGNOSIS — D709 Neutropenia, unspecified: Secondary | ICD-10-CM | POA: Diagnosis not present

## 2016-07-16 DIAGNOSIS — I5040 Unspecified combined systolic (congestive) and diastolic (congestive) heart failure: Secondary | ICD-10-CM | POA: Diagnosis not present

## 2016-07-17 DIAGNOSIS — C9202 Acute myeloblastic leukemia, in relapse: Secondary | ICD-10-CM | POA: Diagnosis not present

## 2016-07-17 DIAGNOSIS — Z79899 Other long term (current) drug therapy: Secondary | ICD-10-CM | POA: Diagnosis not present

## 2016-07-17 DIAGNOSIS — Z5111 Encounter for antineoplastic chemotherapy: Secondary | ICD-10-CM | POA: Diagnosis not present

## 2016-07-18 DIAGNOSIS — Z5111 Encounter for antineoplastic chemotherapy: Secondary | ICD-10-CM | POA: Diagnosis not present

## 2016-07-18 DIAGNOSIS — C9202 Acute myeloblastic leukemia, in relapse: Secondary | ICD-10-CM | POA: Diagnosis not present

## 2016-07-18 DIAGNOSIS — Z79899 Other long term (current) drug therapy: Secondary | ICD-10-CM | POA: Diagnosis not present

## 2016-07-19 DIAGNOSIS — C9202 Acute myeloblastic leukemia, in relapse: Secondary | ICD-10-CM | POA: Diagnosis not present

## 2016-07-19 DIAGNOSIS — Z79899 Other long term (current) drug therapy: Secondary | ICD-10-CM | POA: Diagnosis not present

## 2016-07-19 DIAGNOSIS — L03115 Cellulitis of right lower limb: Secondary | ICD-10-CM | POA: Diagnosis not present

## 2016-07-19 DIAGNOSIS — Z5111 Encounter for antineoplastic chemotherapy: Secondary | ICD-10-CM | POA: Diagnosis not present

## 2016-07-23 DIAGNOSIS — Z95828 Presence of other vascular implants and grafts: Secondary | ICD-10-CM | POA: Diagnosis not present

## 2016-07-23 DIAGNOSIS — F4310 Post-traumatic stress disorder, unspecified: Secondary | ICD-10-CM | POA: Diagnosis not present

## 2016-07-23 DIAGNOSIS — L03818 Cellulitis of other sites: Secondary | ICD-10-CM | POA: Diagnosis not present

## 2016-07-23 DIAGNOSIS — G4700 Insomnia, unspecified: Secondary | ICD-10-CM | POA: Diagnosis not present

## 2016-07-23 DIAGNOSIS — M109 Gout, unspecified: Secondary | ICD-10-CM | POA: Diagnosis not present

## 2016-07-23 DIAGNOSIS — R21 Rash and other nonspecific skin eruption: Secondary | ICD-10-CM | POA: Diagnosis not present

## 2016-07-23 DIAGNOSIS — Z79899 Other long term (current) drug therapy: Secondary | ICD-10-CM | POA: Diagnosis not present

## 2016-07-23 DIAGNOSIS — E559 Vitamin D deficiency, unspecified: Secondary | ICD-10-CM | POA: Diagnosis not present

## 2016-07-23 DIAGNOSIS — R110 Nausea: Secondary | ICD-10-CM | POA: Diagnosis not present

## 2016-07-23 DIAGNOSIS — E119 Type 2 diabetes mellitus without complications: Secondary | ICD-10-CM | POA: Diagnosis not present

## 2016-07-23 DIAGNOSIS — I4891 Unspecified atrial fibrillation: Secondary | ICD-10-CM | POA: Diagnosis not present

## 2016-07-23 DIAGNOSIS — E785 Hyperlipidemia, unspecified: Secondary | ICD-10-CM | POA: Diagnosis not present

## 2016-07-23 DIAGNOSIS — L03115 Cellulitis of right lower limb: Secondary | ICD-10-CM | POA: Diagnosis not present

## 2016-07-23 DIAGNOSIS — K5900 Constipation, unspecified: Secondary | ICD-10-CM | POA: Diagnosis not present

## 2016-07-23 DIAGNOSIS — Z7982 Long term (current) use of aspirin: Secondary | ICD-10-CM | POA: Diagnosis not present

## 2016-07-23 DIAGNOSIS — I110 Hypertensive heart disease with heart failure: Secondary | ICD-10-CM | POA: Diagnosis not present

## 2016-07-23 DIAGNOSIS — C9202 Acute myeloblastic leukemia, in relapse: Secondary | ICD-10-CM | POA: Diagnosis not present

## 2016-07-23 DIAGNOSIS — Z7984 Long term (current) use of oral hypoglycemic drugs: Secondary | ICD-10-CM | POA: Diagnosis not present

## 2016-07-23 DIAGNOSIS — D61818 Other pancytopenia: Secondary | ICD-10-CM | POA: Diagnosis not present

## 2016-07-30 DIAGNOSIS — E119 Type 2 diabetes mellitus without complications: Secondary | ICD-10-CM | POA: Diagnosis not present

## 2016-07-30 DIAGNOSIS — C9202 Acute myeloblastic leukemia, in relapse: Secondary | ICD-10-CM | POA: Diagnosis not present

## 2016-07-30 DIAGNOSIS — Z7984 Long term (current) use of oral hypoglycemic drugs: Secondary | ICD-10-CM | POA: Diagnosis not present

## 2016-07-30 DIAGNOSIS — I10 Essential (primary) hypertension: Secondary | ICD-10-CM | POA: Diagnosis not present

## 2016-07-30 DIAGNOSIS — Z7982 Long term (current) use of aspirin: Secondary | ICD-10-CM | POA: Diagnosis not present

## 2016-07-30 DIAGNOSIS — Z79899 Other long term (current) drug therapy: Secondary | ICD-10-CM | POA: Diagnosis not present

## 2016-08-03 ENCOUNTER — Telehealth: Payer: Self-pay | Admitting: *Deleted

## 2016-08-03 DIAGNOSIS — R600 Localized edema: Secondary | ICD-10-CM | POA: Diagnosis not present

## 2016-08-03 DIAGNOSIS — I872 Venous insufficiency (chronic) (peripheral): Secondary | ICD-10-CM | POA: Diagnosis not present

## 2016-08-03 DIAGNOSIS — L97911 Non-pressure chronic ulcer of unspecified part of right lower leg limited to breakdown of skin: Secondary | ICD-10-CM | POA: Diagnosis not present

## 2016-08-03 NOTE — Telephone Encounter (Signed)
Appt 09/20/16

## 2016-08-05 ENCOUNTER — Emergency Department (HOSPITAL_BASED_OUTPATIENT_CLINIC_OR_DEPARTMENT_OTHER)
Admission: EM | Admit: 2016-08-05 | Discharge: 2016-08-05 | Disposition: A | Discharge status: Home / Self Care | Payer: Medicare Other | Attending: Physician Assistant | Admitting: Physician Assistant

## 2016-08-05 ENCOUNTER — Encounter (HOSPITAL_BASED_OUTPATIENT_CLINIC_OR_DEPARTMENT_OTHER): Payer: Self-pay | Admitting: *Deleted

## 2016-08-05 DIAGNOSIS — E119 Type 2 diabetes mellitus without complications: Secondary | ICD-10-CM | POA: Insufficient documentation

## 2016-08-05 DIAGNOSIS — Z7984 Long term (current) use of oral hypoglycemic drugs: Secondary | ICD-10-CM | POA: Insufficient documentation

## 2016-08-05 DIAGNOSIS — Z87891 Personal history of nicotine dependence: Secondary | ICD-10-CM | POA: Insufficient documentation

## 2016-08-05 DIAGNOSIS — K047 Periapical abscess without sinus: Secondary | ICD-10-CM | POA: Insufficient documentation

## 2016-08-05 DIAGNOSIS — I10 Essential (primary) hypertension: Secondary | ICD-10-CM | POA: Insufficient documentation

## 2016-08-05 DIAGNOSIS — K0889 Other specified disorders of teeth and supporting structures: Secondary | ICD-10-CM | POA: Diagnosis present

## 2016-08-05 DIAGNOSIS — Z79899 Other long term (current) drug therapy: Secondary | ICD-10-CM | POA: Diagnosis not present

## 2016-08-05 MED ORDER — CLINDAMYCIN HCL 150 MG PO CAPS: 300.0000 mg | ORAL_CAPSULE | Freq: Four times a day (QID) | ORAL | 0 refills | Status: AC

## 2016-08-05 MED ORDER — CLINDAMYCIN HCL 150 MG PO CAPS
300.0000 mg | ORAL_CAPSULE | Freq: Once | ORAL | Status: AC
Start: 2016-08-05 — End: 2016-08-05
  Administered 2016-08-05: 300 mg via ORAL
  Filled 2016-08-05: qty 2

## 2016-08-05 MED ORDER — PENICILLIN V POTASSIUM 250 MG PO TABS: 500.0000 mg | ORAL_TABLET | Freq: Once | ORAL | Status: DC

## 2016-08-05 MED FILL — CLINDAMYCIN HCL 150 MG CAPS: 150 | 7 days supply | Qty: 56 | Fill #0

## 2016-08-05 NOTE — Discharge Instructions (Signed)
You have very irritated, infected gums. You need to have your tooth pulled likely. There is no drainable abscess at this point. However if you notice any bulging that needs to be drained please return. Otherwise we want you to call the dentist today, to hopefully hav follow up tomorrow, or  as quickly follow-up is possible.Return with fver, inability to eat, feelings of being ill.

## 2016-08-05 NOTE — ED Notes (Signed)
ED Provider at bedside. 

## 2016-08-05 NOTE — ED Triage Notes (Signed)
C/o mid front upper toothache x 2 days.

## 2016-08-05 NOTE — ED Provider Notes (Addendum)
[facility] DEPT [facility] Provider Note   CSN: [id number] Arrival date & time: 08/05/16  1405     History   Chief Complaint Chief Complaint  Patient presents with  . Dental Pain    HPI William Stokes is a 70 y.o. male.  HPI   70 year old male presenting with tooth pain. Patient reports that his teeth have been bothering him for many days. According to old notes he has had poor dentition for extremely long period of time. Patient diagnosis of AML and is planning to have follow-up appointment with hematology tomorrow.  Patient reports no systemic symptoms, shortness breath no chest pain no fever no difficulty eating.  Patient does not feel anything that is drainable or tightness mouth, instead complains of generalized discomfort.  Past Medical History:  Diagnosis Date  . Adenomatous colon polyp   . 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 Anxiety   . Atrial fibrillation with RVR ([place]) 2016  . Cataracts, bilateral    immature  . Diabetes mellitus 2/09   takes Januvia-started 2wks ago  . Eczema    legs and hands  . Erectile dysfunction   . GERD (gastroesophageal reflux disease)    takes OMeprazole daily  . Glaucoma   . H/O septic shock 2016  . H/O transfusion of packed red blood cells 2016,2017   [facility]  . Hyperlipidemia    was on Crestor but has been off for a couple of months  . Hypertension 3/11   takes Lisinopril daily  . Insomnia   . LAFB (left anterior fascicular block)    per baseline EKG 2009  . Osteoarthritis    left knee  . Primary male hypogonadism   . PTSD (post-traumatic stress disorder)    went to [place], has chronic diff. sleeping  . Respiratory failure with hypoxia ([place]) 2016   Acute  . Sleep apnea   . Sleep apnea, obstructive    uses CPAP and last sleep study in epic from 2011    Patient Active Problem List   Diagnosis Date Noted  . PCP NOTES >>> 05/21/2015  . Hypomagnesemia 02/20/2015  . Reduced mobility 01/21/2015  .  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 Acute myelogenous leukemia ([place]) 12/27/2014  . Acute myeloblastic leukemia in remission ([place]) 12/27/2014  . 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 Left knee DJD 03/28/2012    Class: Chronic  . Medicare annual wellness visit, initial 02/23/2011  . GERD (gastroesophageal reflux disease) 02/23/2011  . INADEQUATE SLEEP HYGIENE 10/18/2009  . OBSTRUCTIVE SLEEP APNEA 10/06/2009  . ESSENTIAL HYPERTENSION 09/23/2009  . SLEEP DISORDER 09/23/2009  . HYPOGONADISM, MALE 11/18/2008  . GLAUCOMA 07/17/2008  . Dyslipidemia 04/17/2008  . ERECTILE DYSFUNCTION 04/17/2008  . PTSD 01/01/2008  . Diabetes ([place]) 10/25/2007  . OSTEOARTHRITIS 10/10/2007    Past Surgical History:  Procedure Laterality Date  . COLONOSCOPY    . HERNIA REPAIR    . KNEE ARTHROSCOPY  1999   meniscal torn L, Dr.Dalldorf  . QUADRICEPS TENDON REPAIR  while in [place]   left leg  . TOTAL KNEE ARTHROPLASTY  03/28/2012   LEFT TOTAL KNEE ARTHROPLASTY;  Surgeon: Hessie Dibble, MD;  Location: [place];  Service: Orthopedics;  Laterality: Left;  with revision tibia  . TOTAL KNEE ARTHROPLASTY  08/03/2012   Procedure: TOTAL KNEE ARTHROPLASTY;  Surgeon: Hessie Dibble, MD;  Location: [place];  Service: Orthopedics;  Laterality: Right;       Home Medications    Prior  to Admission medications   Medication Sig Start Date End Date Taking? Authorizing Provider  atorvastatin (LIPITOR) 80 MG tablet Take 1 tablet (80 mg total) by mouth daily. 12/15/15  Yes Colon Branch, MD  buPROPion [facility] SR) 150 MG 12 hr tablet Take 150 mg by mouth 2 (two) times daily.  05/12/15  Yes Colon Branch, MD  dorzolamide-timolol (COSOPT) 22.3-6.8 MG/ML ophthalmic solution Place 1 drop into both eyes 2 (two) times daily. 05/12/15  Yes Colon Branch, MD  fluconazole (DIFLUCAN) 200 MG tablet Take 200 mg by mouth daily. Reported on 10/29/2015 05/12/15  Yes Colon Branch, MD  glucose blood (FREESTYLE LITE) test strip Check blood sugar twice daily.  02/12/15  Yes Colon Branch, MD  ibuprofen (ADVIL,MOTRIN) 800 MG tablet Take 800 mg by mouth 2 (two) times daily.   Yes Historical Provider, MD  Lancets (FREESTYLE) lancets Check once daily. Dx: 250.00 01/17/13  Yes Colon Branch, MD  levofloxacin (LEVAQUIN) 500 MG tablet Take 500 mg by mouth daily. Reported on 10/29/2015 05/12/15  Yes Colon Branch, MD  lidocaine-prilocaine (EMLA) cream Apply 1 application topically once. 1 hour prior to port access 05/12/15  Yes Colon Branch, MD  lisinopril (PRINIVIL,ZESTRIL) 10 MG tablet Take 1 tablet (10 mg total) by mouth daily. 05/12/15  Yes Colon Branch, MD  Magnesium Oxide 400 (240 MG) MG TABS Take 1 tablet by mouth 2 (two) times daily. 03/26/15  Yes Colon Branch, MD  metFORMIN (GLUCOPHAGE) 850 MG tablet Take 1 tablet (850 mg total) by mouth daily with breakfast. 05/12/15  Yes Colon Branch, MD  Midostaurin (RYDAPT) 25 MG CAPS Take 50 mg by mouth 2 (two) times daily.   Yes Historical Provider, MD  omeprazole (PRILOSEC) 40 MG capsule Take 1 capsule (40 mg total) by mouth daily. 06/27/13 08/05/16 Yes Colon Branch, MD  ondansetron (ZOFRAN) 8 MG tablet Take by mouth every 8 (eight) hours as needed for nausea or vomiting. Reported on 10/29/2015 05/12/15  Yes Colon Branch, MD  prochlorperazine (COMPAZINE) 10 MG tablet Take 10 mg by mouth every 6 (six) hours as needed for nausea or vomiting. Reported on 10/29/2015 05/12/15  Yes Colon Branch, MD  sertraline (ZOLOFT) 100 MG tablet Take 2 tablets (200 mg total) by mouth daily. 02/12/15  Yes Colon Branch, MD    Family History Family History  Problem Relation Age of Onset  . Heart disease Mother     MI age onset 55s?  . Diabetes Mother   . Prostate cancer Father     dx age 62s  . Colon cancer Neg Hx     Social History Social History  Substance Use Topics  . Smoking status: Former Smoker    Packs/day: 2.00    Years: 22.00    Types: Cigarettes    Quit date: 08/17/1991  . Smokeless tobacco: Never Used  . Alcohol use No     Allergies     Nitrofurantoin   Review of Systems Review of Systems  Constitutional: Negative for fatigue and fever.  Respiratory: Negative for cough and shortness of breath.   Gastrointestinal: Negative for abdominal pain and nausea.  All other systems reviewed and are negative.    Physical Exam Updated Vital Signs BP 117/76 (BP Location: Left Arm)   Pulse 116   Temp 97.9 F (36.6 C) (Oral)   Resp 22   Ht 5\' 11"  (1.803 m)   Wt 244 lb (110.7 kg)  SpO2 98%   BMI 34.03 kg/m   Physical Exam  Constitutional: He is oriented to person, place, and time. He appears well-nourished.  HENT:  Head: Normocephalic.  Patient has terrible dentition. Multiple caries, rotting teeth, only scattered teeth remaining. The tooth that is bothering him is the left front tooth and it is very loose. Surrounded by erythematous irritable tissue. Do not see any drainable abscess at this time.  Eyes: Conjunctivae are normal.  Cardiovascular: Regular rhythm.   Tachycardia.  Pulmonary/Chest: Effort normal and breath sounds normal. No respiratory distress.  Neurological: He is oriented to person, place, and time.  Skin: Skin is warm and dry. He is not diaphoretic.  Psychiatric: He has a normal mood and affect. His behavior is normal.     ED Treatments / Results  Labs (all labs ordered are listed, but only abnormal results are displayed) Labs Reviewed - No data to display  EKG  EKG Interpretation None       Radiology No results found.  Procedures Procedures (including critical care time)  Medications Ordered in ED Medications - No data to display   Initial Impression / Assessment and Plan / ED Course  I have reviewed the triage vital signs and the nursing notes.  Pertinent labs & imaging results that were available during my care of the patient were reviewed by me and considered in my medical decision making (see chart for details).  Clinical Course     Pt is a 69 year old male with history of  AML presenting with dental pain. Patient's had irritable gums on the top middle teeth. There is 1 tooth left and it is grossly decayed. It is loose. I do not see any surrounding abscess. Wife reports that they do not have insurance for dental until the first on the month. We will give them referral to on-call dentist. In the meantime we will do antibiotics for what appears to be gingival infection  Patient otherwise appears normal. I asked about patient's high heart rate and wife says that patient is always tachycardic around doctors. (Patient's wife used to be a [employer] scientist). He states he feels systemically well, no nausea no vomiting or fevers. Patient has follow-up tomorrow morning with his oncologist.  Will treat with clinda to cover ANUG, though does not look this severe. Gave denitist on call for them to see tomorrow.     Final Clinical Impressions(s) / ED Diagnoses   Final diagnoses:  None    New Prescriptions New Prescriptions   No medications on file     Rosamae Rocque Julio Alm, MD 08/05/16 [place], MD 08/05/16 [place], MD 08/05/16 [id number]

## 2016-08-06 DIAGNOSIS — E119 Type 2 diabetes mellitus without complications: Secondary | ICD-10-CM | POA: Diagnosis not present

## 2016-08-06 DIAGNOSIS — E785 Hyperlipidemia, unspecified: Secondary | ICD-10-CM | POA: Diagnosis not present

## 2016-08-06 DIAGNOSIS — K122 Cellulitis and abscess of mouth: Secondary | ICD-10-CM | POA: Diagnosis not present

## 2016-08-06 DIAGNOSIS — K089 Disorder of teeth and supporting structures, unspecified: Secondary | ICD-10-CM | POA: Diagnosis not present

## 2016-08-06 DIAGNOSIS — I5040 Unspecified combined systolic (congestive) and diastolic (congestive) heart failure: Secondary | ICD-10-CM | POA: Diagnosis not present

## 2016-08-06 DIAGNOSIS — I10 Essential (primary) hypertension: Secondary | ICD-10-CM | POA: Diagnosis not present

## 2016-08-06 DIAGNOSIS — I4891 Unspecified atrial fibrillation: Secondary | ICD-10-CM | POA: Diagnosis not present

## 2016-08-06 DIAGNOSIS — Z7984 Long term (current) use of oral hypoglycemic drugs: Secondary | ICD-10-CM | POA: Diagnosis not present

## 2016-08-06 DIAGNOSIS — D709 Neutropenia, unspecified: Secondary | ICD-10-CM | POA: Diagnosis not present

## 2016-08-06 DIAGNOSIS — C9202 Acute myeloblastic leukemia, in relapse: Secondary | ICD-10-CM | POA: Diagnosis not present

## 2016-08-10 DIAGNOSIS — E559 Vitamin D deficiency, unspecified: Secondary | ICD-10-CM | POA: Diagnosis not present

## 2016-08-10 DIAGNOSIS — K0510 Chronic gingivitis, plaque induced: Secondary | ICD-10-CM | POA: Diagnosis not present

## 2016-08-10 DIAGNOSIS — G4700 Insomnia, unspecified: Secondary | ICD-10-CM | POA: Diagnosis not present

## 2016-08-10 DIAGNOSIS — I4891 Unspecified atrial fibrillation: Secondary | ICD-10-CM | POA: Diagnosis not present

## 2016-08-10 DIAGNOSIS — Z7901 Long term (current) use of anticoagulants: Secondary | ICD-10-CM | POA: Diagnosis not present

## 2016-08-10 DIAGNOSIS — Z7982 Long term (current) use of aspirin: Secondary | ICD-10-CM | POA: Diagnosis not present

## 2016-08-10 DIAGNOSIS — Z79899 Other long term (current) drug therapy: Secondary | ICD-10-CM | POA: Diagnosis not present

## 2016-08-10 DIAGNOSIS — D709 Neutropenia, unspecified: Secondary | ICD-10-CM | POA: Diagnosis not present

## 2016-08-10 DIAGNOSIS — R21 Rash and other nonspecific skin eruption: Secondary | ICD-10-CM | POA: Diagnosis not present

## 2016-08-10 DIAGNOSIS — M109 Gout, unspecified: Secondary | ICD-10-CM | POA: Diagnosis not present

## 2016-08-10 DIAGNOSIS — Z5111 Encounter for antineoplastic chemotherapy: Secondary | ICD-10-CM | POA: Diagnosis not present

## 2016-08-10 DIAGNOSIS — I10 Essential (primary) hypertension: Secondary | ICD-10-CM | POA: Diagnosis not present

## 2016-08-10 DIAGNOSIS — E119 Type 2 diabetes mellitus without complications: Secondary | ICD-10-CM | POA: Diagnosis not present

## 2016-08-10 DIAGNOSIS — F4310 Post-traumatic stress disorder, unspecified: Secondary | ICD-10-CM | POA: Diagnosis not present

## 2016-08-10 DIAGNOSIS — E785 Hyperlipidemia, unspecified: Secondary | ICD-10-CM | POA: Diagnosis not present

## 2016-08-10 DIAGNOSIS — K5900 Constipation, unspecified: Secondary | ICD-10-CM | POA: Diagnosis not present

## 2016-08-10 DIAGNOSIS — Z7984 Long term (current) use of oral hypoglycemic drugs: Secondary | ICD-10-CM | POA: Diagnosis not present

## 2016-08-10 DIAGNOSIS — R110 Nausea: Secondary | ICD-10-CM | POA: Diagnosis not present

## 2016-08-10 DIAGNOSIS — C9202 Acute myeloblastic leukemia, in relapse: Secondary | ICD-10-CM | POA: Diagnosis not present

## 2016-08-11 DIAGNOSIS — C9202 Acute myeloblastic leukemia, in relapse: Secondary | ICD-10-CM | POA: Diagnosis not present

## 2016-08-11 DIAGNOSIS — Z5111 Encounter for antineoplastic chemotherapy: Secondary | ICD-10-CM | POA: Diagnosis not present

## 2016-08-12 DIAGNOSIS — C9202 Acute myeloblastic leukemia, in relapse: Secondary | ICD-10-CM | POA: Diagnosis not present

## 2016-08-12 DIAGNOSIS — Z5111 Encounter for antineoplastic chemotherapy: Secondary | ICD-10-CM | POA: Diagnosis not present

## 2016-08-12 DIAGNOSIS — Z79899 Other long term (current) drug therapy: Secondary | ICD-10-CM | POA: Diagnosis not present

## 2016-08-13 DIAGNOSIS — Z79899 Other long term (current) drug therapy: Secondary | ICD-10-CM | POA: Diagnosis not present

## 2016-08-13 DIAGNOSIS — I110 Hypertensive heart disease with heart failure: Secondary | ICD-10-CM | POA: Diagnosis not present

## 2016-08-13 DIAGNOSIS — R21 Rash and other nonspecific skin eruption: Secondary | ICD-10-CM | POA: Diagnosis not present

## 2016-08-13 DIAGNOSIS — M7751 Other enthesopathy of right foot: Secondary | ICD-10-CM | POA: Diagnosis not present

## 2016-08-13 DIAGNOSIS — Z5111 Encounter for antineoplastic chemotherapy: Secondary | ICD-10-CM | POA: Diagnosis not present

## 2016-08-13 DIAGNOSIS — E785 Hyperlipidemia, unspecified: Secondary | ICD-10-CM | POA: Diagnosis not present

## 2016-08-13 DIAGNOSIS — G4700 Insomnia, unspecified: Secondary | ICD-10-CM | POA: Diagnosis not present

## 2016-08-13 DIAGNOSIS — I10 Essential (primary) hypertension: Secondary | ICD-10-CM | POA: Diagnosis not present

## 2016-08-13 DIAGNOSIS — K0510 Chronic gingivitis, plaque induced: Secondary | ICD-10-CM | POA: Diagnosis not present

## 2016-08-13 DIAGNOSIS — C9202 Acute myeloblastic leukemia, in relapse: Secondary | ICD-10-CM | POA: Diagnosis not present

## 2016-08-13 DIAGNOSIS — M109 Gout, unspecified: Secondary | ICD-10-CM | POA: Diagnosis not present

## 2016-08-13 DIAGNOSIS — Z7984 Long term (current) use of oral hypoglycemic drugs: Secondary | ICD-10-CM | POA: Diagnosis not present

## 2016-08-13 DIAGNOSIS — E559 Vitamin D deficiency, unspecified: Secondary | ICD-10-CM | POA: Diagnosis not present

## 2016-08-13 DIAGNOSIS — F4310 Post-traumatic stress disorder, unspecified: Secondary | ICD-10-CM | POA: Diagnosis not present

## 2016-08-13 DIAGNOSIS — K5900 Constipation, unspecified: Secondary | ICD-10-CM | POA: Diagnosis not present

## 2016-08-13 DIAGNOSIS — M779 Enthesopathy, unspecified: Secondary | ICD-10-CM | POA: Diagnosis not present

## 2016-08-13 DIAGNOSIS — D709 Neutropenia, unspecified: Secondary | ICD-10-CM | POA: Diagnosis not present

## 2016-08-13 DIAGNOSIS — Z7982 Long term (current) use of aspirin: Secondary | ICD-10-CM | POA: Diagnosis not present

## 2016-08-13 DIAGNOSIS — I5042 Chronic combined systolic (congestive) and diastolic (congestive) heart failure: Secondary | ICD-10-CM | POA: Diagnosis not present

## 2016-08-13 DIAGNOSIS — K068 Other specified disorders of gingiva and edentulous alveolar ridge: Secondary | ICD-10-CM | POA: Diagnosis not present

## 2016-08-13 DIAGNOSIS — R110 Nausea: Secondary | ICD-10-CM | POA: Diagnosis not present

## 2016-08-13 DIAGNOSIS — E119 Type 2 diabetes mellitus without complications: Secondary | ICD-10-CM | POA: Diagnosis not present

## 2016-08-13 DIAGNOSIS — I4891 Unspecified atrial fibrillation: Secondary | ICD-10-CM | POA: Diagnosis not present

## 2016-08-14 DIAGNOSIS — C9202 Acute myeloblastic leukemia, in relapse: Secondary | ICD-10-CM | POA: Diagnosis not present

## 2016-08-14 DIAGNOSIS — Z5111 Encounter for antineoplastic chemotherapy: Secondary | ICD-10-CM | POA: Diagnosis not present

## 2016-08-17 DIAGNOSIS — Z79899 Other long term (current) drug therapy: Secondary | ICD-10-CM | POA: Diagnosis not present

## 2016-08-17 DIAGNOSIS — I5042 Chronic combined systolic (congestive) and diastolic (congestive) heart failure: Secondary | ICD-10-CM | POA: Diagnosis not present

## 2016-08-17 DIAGNOSIS — F4310 Post-traumatic stress disorder, unspecified: Secondary | ICD-10-CM | POA: Diagnosis not present

## 2016-08-17 DIAGNOSIS — K0510 Chronic gingivitis, plaque induced: Secondary | ICD-10-CM | POA: Diagnosis not present

## 2016-08-17 DIAGNOSIS — G4700 Insomnia, unspecified: Secondary | ICD-10-CM | POA: Diagnosis not present

## 2016-08-17 DIAGNOSIS — D709 Neutropenia, unspecified: Secondary | ICD-10-CM | POA: Diagnosis not present

## 2016-08-17 DIAGNOSIS — K5900 Constipation, unspecified: Secondary | ICD-10-CM | POA: Diagnosis not present

## 2016-08-17 DIAGNOSIS — Z7984 Long term (current) use of oral hypoglycemic drugs: Secondary | ICD-10-CM | POA: Diagnosis not present

## 2016-08-17 DIAGNOSIS — I4891 Unspecified atrial fibrillation: Secondary | ICD-10-CM | POA: Diagnosis not present

## 2016-08-17 DIAGNOSIS — Z7982 Long term (current) use of aspirin: Secondary | ICD-10-CM | POA: Diagnosis not present

## 2016-08-17 DIAGNOSIS — C9202 Acute myeloblastic leukemia, in relapse: Secondary | ICD-10-CM | POA: Diagnosis not present

## 2016-08-17 DIAGNOSIS — R110 Nausea: Secondary | ICD-10-CM | POA: Diagnosis not present

## 2016-08-17 DIAGNOSIS — M109 Gout, unspecified: Secondary | ICD-10-CM | POA: Diagnosis not present

## 2016-08-17 DIAGNOSIS — E785 Hyperlipidemia, unspecified: Secondary | ICD-10-CM | POA: Diagnosis not present

## 2016-08-17 DIAGNOSIS — I110 Hypertensive heart disease with heart failure: Secondary | ICD-10-CM | POA: Diagnosis not present

## 2016-08-17 DIAGNOSIS — R21 Rash and other nonspecific skin eruption: Secondary | ICD-10-CM | POA: Diagnosis not present

## 2016-08-17 DIAGNOSIS — E119 Type 2 diabetes mellitus without complications: Secondary | ICD-10-CM | POA: Diagnosis not present

## 2016-08-17 DIAGNOSIS — Z5111 Encounter for antineoplastic chemotherapy: Secondary | ICD-10-CM | POA: Diagnosis not present

## 2016-08-20 DIAGNOSIS — Z87891 Personal history of nicotine dependence: Secondary | ICD-10-CM | POA: Diagnosis not present

## 2016-08-20 DIAGNOSIS — L97311 Non-pressure chronic ulcer of right ankle limited to breakdown of skin: Secondary | ICD-10-CM | POA: Diagnosis not present

## 2016-08-20 DIAGNOSIS — Z7984 Long term (current) use of oral hypoglycemic drugs: Secondary | ICD-10-CM | POA: Diagnosis not present

## 2016-08-20 DIAGNOSIS — Z7982 Long term (current) use of aspirin: Secondary | ICD-10-CM | POA: Diagnosis not present

## 2016-08-20 DIAGNOSIS — E11622 Type 2 diabetes mellitus with other skin ulcer: Secondary | ICD-10-CM | POA: Diagnosis not present

## 2016-08-20 DIAGNOSIS — Z79899 Other long term (current) drug therapy: Secondary | ICD-10-CM | POA: Diagnosis not present

## 2016-08-20 DIAGNOSIS — Z96659 Presence of unspecified artificial knee joint: Secondary | ICD-10-CM | POA: Diagnosis not present

## 2016-08-20 DIAGNOSIS — I10 Essential (primary) hypertension: Secondary | ICD-10-CM | POA: Diagnosis not present

## 2016-08-20 DIAGNOSIS — Z881 Allergy status to other antibiotic agents status: Secondary | ICD-10-CM | POA: Diagnosis not present

## 2016-08-25 DIAGNOSIS — I8003 Phlebitis and thrombophlebitis of superficial vessels of lower extremities, bilateral: Secondary | ICD-10-CM | POA: Diagnosis not present

## 2016-08-25 DIAGNOSIS — I872 Venous insufficiency (chronic) (peripheral): Secondary | ICD-10-CM | POA: Diagnosis not present

## 2016-08-25 DIAGNOSIS — L97319 Non-pressure chronic ulcer of right ankle with unspecified severity: Secondary | ICD-10-CM | POA: Diagnosis not present

## 2016-08-27 DIAGNOSIS — Z7984 Long term (current) use of oral hypoglycemic drugs: Secondary | ICD-10-CM | POA: Diagnosis not present

## 2016-08-27 DIAGNOSIS — Z79899 Other long term (current) drug therapy: Secondary | ICD-10-CM | POA: Diagnosis not present

## 2016-08-27 DIAGNOSIS — L97311 Non-pressure chronic ulcer of right ankle limited to breakdown of skin: Secondary | ICD-10-CM | POA: Diagnosis not present

## 2016-08-27 DIAGNOSIS — E11622 Type 2 diabetes mellitus with other skin ulcer: Secondary | ICD-10-CM | POA: Diagnosis not present

## 2016-08-27 DIAGNOSIS — D708 Other neutropenia: Secondary | ICD-10-CM | POA: Diagnosis not present

## 2016-08-27 DIAGNOSIS — Z881 Allergy status to other antibiotic agents status: Secondary | ICD-10-CM | POA: Diagnosis not present

## 2016-08-27 DIAGNOSIS — I5041 Acute combined systolic (congestive) and diastolic (congestive) heart failure: Secondary | ICD-10-CM | POA: Diagnosis not present

## 2016-08-27 DIAGNOSIS — I110 Hypertensive heart disease with heart failure: Secondary | ICD-10-CM | POA: Diagnosis not present

## 2016-08-27 DIAGNOSIS — Z7982 Long term (current) use of aspirin: Secondary | ICD-10-CM | POA: Diagnosis not present

## 2016-08-27 DIAGNOSIS — Z9221 Personal history of antineoplastic chemotherapy: Secondary | ICD-10-CM | POA: Diagnosis not present

## 2016-08-27 DIAGNOSIS — I4891 Unspecified atrial fibrillation: Secondary | ICD-10-CM | POA: Diagnosis not present

## 2016-08-27 DIAGNOSIS — E119 Type 2 diabetes mellitus without complications: Secondary | ICD-10-CM | POA: Diagnosis not present

## 2016-08-27 DIAGNOSIS — C9202 Acute myeloblastic leukemia, in relapse: Secondary | ICD-10-CM | POA: Diagnosis not present

## 2016-08-27 DIAGNOSIS — Z923 Personal history of irradiation: Secondary | ICD-10-CM | POA: Diagnosis not present

## 2016-09-03 DIAGNOSIS — Z7984 Long term (current) use of oral hypoglycemic drugs: Secondary | ICD-10-CM | POA: Diagnosis not present

## 2016-09-03 DIAGNOSIS — L97311 Non-pressure chronic ulcer of right ankle limited to breakdown of skin: Secondary | ICD-10-CM | POA: Diagnosis not present

## 2016-09-03 DIAGNOSIS — I110 Hypertensive heart disease with heart failure: Secondary | ICD-10-CM | POA: Diagnosis not present

## 2016-09-03 DIAGNOSIS — E119 Type 2 diabetes mellitus without complications: Secondary | ICD-10-CM | POA: Diagnosis not present

## 2016-09-03 DIAGNOSIS — Z9221 Personal history of antineoplastic chemotherapy: Secondary | ICD-10-CM | POA: Diagnosis not present

## 2016-09-03 DIAGNOSIS — K5900 Constipation, unspecified: Secondary | ICD-10-CM | POA: Diagnosis not present

## 2016-09-03 DIAGNOSIS — Z7982 Long term (current) use of aspirin: Secondary | ICD-10-CM | POA: Diagnosis not present

## 2016-09-03 DIAGNOSIS — E785 Hyperlipidemia, unspecified: Secondary | ICD-10-CM | POA: Diagnosis not present

## 2016-09-03 DIAGNOSIS — I5040 Unspecified combined systolic (congestive) and diastolic (congestive) heart failure: Secondary | ICD-10-CM | POA: Diagnosis not present

## 2016-09-03 DIAGNOSIS — I4891 Unspecified atrial fibrillation: Secondary | ICD-10-CM | POA: Diagnosis not present

## 2016-09-03 DIAGNOSIS — C9202 Acute myeloblastic leukemia, in relapse: Secondary | ICD-10-CM | POA: Diagnosis not present

## 2016-09-03 DIAGNOSIS — I10 Essential (primary) hypertension: Secondary | ICD-10-CM | POA: Diagnosis not present

## 2016-09-03 DIAGNOSIS — R110 Nausea: Secondary | ICD-10-CM | POA: Diagnosis not present

## 2016-09-03 DIAGNOSIS — R600 Localized edema: Secondary | ICD-10-CM | POA: Diagnosis not present

## 2016-09-03 DIAGNOSIS — D709 Neutropenia, unspecified: Secondary | ICD-10-CM | POA: Diagnosis not present

## 2016-09-03 DIAGNOSIS — Z79899 Other long term (current) drug therapy: Secondary | ICD-10-CM | POA: Diagnosis not present

## 2016-09-07 DIAGNOSIS — E785 Hyperlipidemia, unspecified: Secondary | ICD-10-CM | POA: Diagnosis not present

## 2016-09-07 DIAGNOSIS — E119 Type 2 diabetes mellitus without complications: Secondary | ICD-10-CM | POA: Diagnosis not present

## 2016-09-07 DIAGNOSIS — Z7984 Long term (current) use of oral hypoglycemic drugs: Secondary | ICD-10-CM | POA: Diagnosis not present

## 2016-09-07 DIAGNOSIS — K5900 Constipation, unspecified: Secondary | ICD-10-CM | POA: Diagnosis not present

## 2016-09-07 DIAGNOSIS — R600 Localized edema: Secondary | ICD-10-CM | POA: Diagnosis not present

## 2016-09-07 DIAGNOSIS — Z79899 Other long term (current) drug therapy: Secondary | ICD-10-CM | POA: Diagnosis not present

## 2016-09-07 DIAGNOSIS — I480 Paroxysmal atrial fibrillation: Secondary | ICD-10-CM | POA: Diagnosis not present

## 2016-09-07 DIAGNOSIS — G4700 Insomnia, unspecified: Secondary | ICD-10-CM | POA: Diagnosis not present

## 2016-09-07 DIAGNOSIS — C9202 Acute myeloblastic leukemia, in relapse: Secondary | ICD-10-CM | POA: Diagnosis not present

## 2016-09-07 DIAGNOSIS — L97311 Non-pressure chronic ulcer of right ankle limited to breakdown of skin: Secondary | ICD-10-CM | POA: Diagnosis not present

## 2016-09-07 DIAGNOSIS — F4312 Post-traumatic stress disorder, chronic: Secondary | ICD-10-CM | POA: Diagnosis not present

## 2016-09-07 DIAGNOSIS — D709 Neutropenia, unspecified: Secondary | ICD-10-CM | POA: Diagnosis not present

## 2016-09-07 DIAGNOSIS — I5042 Chronic combined systolic (congestive) and diastolic (congestive) heart failure: Secondary | ICD-10-CM | POA: Diagnosis not present

## 2016-09-07 DIAGNOSIS — I10 Essential (primary) hypertension: Secondary | ICD-10-CM | POA: Diagnosis not present

## 2016-09-07 DIAGNOSIS — Z7982 Long term (current) use of aspirin: Secondary | ICD-10-CM | POA: Diagnosis not present

## 2016-09-07 DIAGNOSIS — F4310 Post-traumatic stress disorder, unspecified: Secondary | ICD-10-CM | POA: Diagnosis not present

## 2016-09-07 DIAGNOSIS — I110 Hypertensive heart disease with heart failure: Secondary | ICD-10-CM | POA: Diagnosis not present

## 2016-09-07 DIAGNOSIS — I872 Venous insufficiency (chronic) (peripheral): Secondary | ICD-10-CM | POA: Diagnosis not present

## 2016-09-13 DIAGNOSIS — C9202 Acute myeloblastic leukemia, in relapse: Secondary | ICD-10-CM | POA: Diagnosis not present

## 2016-09-13 DIAGNOSIS — I5042 Chronic combined systolic (congestive) and diastolic (congestive) heart failure: Secondary | ICD-10-CM | POA: Diagnosis not present

## 2016-09-13 DIAGNOSIS — I872 Venous insufficiency (chronic) (peripheral): Secondary | ICD-10-CM | POA: Diagnosis not present

## 2016-09-13 DIAGNOSIS — D61810 Antineoplastic chemotherapy induced pancytopenia: Secondary | ICD-10-CM | POA: Diagnosis not present

## 2016-09-13 DIAGNOSIS — L97311 Non-pressure chronic ulcer of right ankle limited to breakdown of skin: Secondary | ICD-10-CM | POA: Diagnosis not present

## 2016-09-13 DIAGNOSIS — D709 Neutropenia, unspecified: Secondary | ICD-10-CM | POA: Diagnosis not present

## 2016-09-13 DIAGNOSIS — R509 Fever, unspecified: Secondary | ICD-10-CM | POA: Diagnosis not present

## 2016-09-13 DIAGNOSIS — R000 Tachycardia, unspecified: Secondary | ICD-10-CM | POA: Diagnosis not present

## 2016-09-13 DIAGNOSIS — E872 Acidosis: Secondary | ICD-10-CM | POA: Diagnosis not present

## 2016-09-13 DIAGNOSIS — A419 Sepsis, unspecified organism: Secondary | ICD-10-CM | POA: Diagnosis not present

## 2016-09-14 DIAGNOSIS — M1A9XX0 Chronic gout, unspecified, without tophus (tophi): Secondary | ICD-10-CM | POA: Diagnosis not present

## 2016-09-14 DIAGNOSIS — I10 Essential (primary) hypertension: Secondary | ICD-10-CM | POA: Diagnosis not present

## 2016-09-14 DIAGNOSIS — E119 Type 2 diabetes mellitus without complications: Secondary | ICD-10-CM | POA: Diagnosis not present

## 2016-09-14 DIAGNOSIS — E785 Hyperlipidemia, unspecified: Secondary | ICD-10-CM | POA: Diagnosis not present

## 2016-09-14 DIAGNOSIS — D709 Neutropenia, unspecified: Secondary | ICD-10-CM | POA: Diagnosis not present

## 2016-09-14 DIAGNOSIS — R000 Tachycardia, unspecified: Secondary | ICD-10-CM | POA: Diagnosis not present

## 2016-09-14 DIAGNOSIS — K5900 Constipation, unspecified: Secondary | ICD-10-CM | POA: Diagnosis not present

## 2016-09-14 DIAGNOSIS — C9202 Acute myeloblastic leukemia, in relapse: Secondary | ICD-10-CM | POA: Diagnosis not present

## 2016-09-14 DIAGNOSIS — R509 Fever, unspecified: Secondary | ICD-10-CM | POA: Diagnosis not present

## 2016-09-14 DIAGNOSIS — R262 Difficulty in walking, not elsewhere classified: Secondary | ICD-10-CM | POA: Diagnosis not present

## 2016-09-15 DIAGNOSIS — I444 Left anterior fascicular block: Secondary | ICD-10-CM | POA: Diagnosis not present

## 2016-09-15 DIAGNOSIS — L97319 Non-pressure chronic ulcer of right ankle with unspecified severity: Secondary | ICD-10-CM | POA: Diagnosis present

## 2016-09-15 DIAGNOSIS — C9202 Acute myeloblastic leukemia, in relapse: Secondary | ICD-10-CM | POA: Diagnosis present

## 2016-09-15 DIAGNOSIS — E872 Acidosis: Secondary | ICD-10-CM | POA: Diagnosis present

## 2016-09-15 DIAGNOSIS — I10 Essential (primary) hypertension: Secondary | ICD-10-CM | POA: Diagnosis not present

## 2016-09-15 DIAGNOSIS — T8189XA Other complications of procedures, not elsewhere classified, initial encounter: Secondary | ICD-10-CM | POA: Diagnosis not present

## 2016-09-15 DIAGNOSIS — E1165 Type 2 diabetes mellitus with hyperglycemia: Secondary | ICD-10-CM | POA: Diagnosis not present

## 2016-09-15 DIAGNOSIS — K219 Gastro-esophageal reflux disease without esophagitis: Secondary | ICD-10-CM | POA: Diagnosis present

## 2016-09-15 DIAGNOSIS — E785 Hyperlipidemia, unspecified: Secondary | ICD-10-CM | POA: Diagnosis present

## 2016-09-15 DIAGNOSIS — B9620 Unspecified Escherichia coli [E. coli] as the cause of diseases classified elsewhere: Secondary | ICD-10-CM | POA: Diagnosis present

## 2016-09-15 DIAGNOSIS — I872 Venous insufficiency (chronic) (peripheral): Secondary | ICD-10-CM | POA: Diagnosis present

## 2016-09-15 DIAGNOSIS — F419 Anxiety disorder, unspecified: Secondary | ICD-10-CM | POA: Diagnosis present

## 2016-09-15 DIAGNOSIS — R9431 Abnormal electrocardiogram [ECG] [EKG]: Secondary | ICD-10-CM | POA: Diagnosis not present

## 2016-09-15 DIAGNOSIS — I87309 Chronic venous hypertension (idiopathic) without complications of unspecified lower extremity: Secondary | ICD-10-CM | POA: Diagnosis not present

## 2016-09-15 DIAGNOSIS — R1310 Dysphagia, unspecified: Secondary | ICD-10-CM | POA: Diagnosis present

## 2016-09-15 DIAGNOSIS — F4312 Post-traumatic stress disorder, chronic: Secondary | ICD-10-CM | POA: Diagnosis not present

## 2016-09-15 DIAGNOSIS — E119 Type 2 diabetes mellitus without complications: Secondary | ICD-10-CM | POA: Diagnosis present

## 2016-09-15 DIAGNOSIS — I110 Hypertensive heart disease with heart failure: Secondary | ICD-10-CM | POA: Diagnosis present

## 2016-09-15 DIAGNOSIS — D709 Neutropenia, unspecified: Secondary | ICD-10-CM | POA: Diagnosis present

## 2016-09-15 DIAGNOSIS — E559 Vitamin D deficiency, unspecified: Secondary | ICD-10-CM | POA: Diagnosis present

## 2016-09-15 DIAGNOSIS — K5900 Constipation, unspecified: Secondary | ICD-10-CM | POA: Diagnosis present

## 2016-09-15 DIAGNOSIS — D61810 Antineoplastic chemotherapy induced pancytopenia: Secondary | ICD-10-CM | POA: Diagnosis present

## 2016-09-15 DIAGNOSIS — A419 Sepsis, unspecified organism: Secondary | ICD-10-CM | POA: Diagnosis present

## 2016-09-15 DIAGNOSIS — F4310 Post-traumatic stress disorder, unspecified: Secondary | ICD-10-CM | POA: Diagnosis present

## 2016-09-15 DIAGNOSIS — T451X5A Adverse effect of antineoplastic and immunosuppressive drugs, initial encounter: Secondary | ICD-10-CM | POA: Diagnosis present

## 2016-09-15 DIAGNOSIS — R5081 Fever presenting with conditions classified elsewhere: Secondary | ICD-10-CM | POA: Diagnosis present

## 2016-09-15 DIAGNOSIS — Z87891 Personal history of nicotine dependence: Secondary | ICD-10-CM | POA: Diagnosis not present

## 2016-09-15 DIAGNOSIS — I5042 Chronic combined systolic (congestive) and diastolic (congestive) heart failure: Secondary | ICD-10-CM | POA: Diagnosis present

## 2016-09-15 DIAGNOSIS — Z856 Personal history of leukemia: Secondary | ICD-10-CM | POA: Diagnosis not present

## 2016-09-16 NOTE — Progress Notes (Deleted)
Pre visit review using our clinic review tool, if applicable. No additional management support is needed unless otherwise documented below in the visit note. 

## 2016-09-16 NOTE — Progress Notes (Deleted)
Subjective:   William Stokes is a 71 y.o. male who presents for Medicare Annual/Subsequent preventive examination.  Review of Systems:  No ROS.  Medicare Wellness Visit.    Sleep patterns: Home Safety/Smoke Alarms:   Living environment; residence and Firearm Safety:  Seat Belt Safety/Bike Helmet: Wears seat belt.   Counseling:   Eye Exam-  Dental-  Male:   CCS- Last 05/07/14: 2 polyps found/removed. F/u in 5 yrs per report.    PSA-  Lab Results  Component Value Date   PSA 1.08 11/20/2013   PSA 0.87 02/23/2011   PSA 0.98 05/15/2009        Objective:    Vitals: There were no vitals taken for this visit.  There is no height or weight on file to calculate BMI.  Tobacco History  Smoking Status  . Former Smoker  . Packs/day: 2.00  . Years: 22.00  . Types: Cigarettes  . Quit date: 08/17/1991  Smokeless Tobacco  . Never Used     Counseling given: Not Answered   Past Medical History:  Diagnosis Date  . Adenomatous colon polyp   . 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 Anxiety   . Atrial fibrillation with RVR ([place]) 2016  . Cataracts, bilateral    immature  . Diabetes mellitus 2/09   takes Januvia-started 2wks ago  . Eczema    legs and hands  . Erectile dysfunction   . GERD (gastroesophageal reflux disease)    takes OMeprazole daily  . Glaucoma   . H/O septic shock 2016  . H/O transfusion of packed red blood cells 2016,2017   [facility]  . Hyperlipidemia    was on Crestor but has been off for a couple of months  . Hypertension 3/11   takes Lisinopril daily  . Insomnia   . LAFB (left anterior fascicular block)    per baseline EKG 2009  . Osteoarthritis    left knee  . Primary male hypogonadism   . PTSD (post-traumatic stress disorder)    went to [place], has chronic diff. sleeping  . Respiratory failure with hypoxia ([place]) 2016   Acute  . Sleep apnea   . Sleep apnea, obstructive    uses CPAP and last sleep study in epic from 2011    Past Surgical History:  Procedure Laterality Date  . COLONOSCOPY    . HERNIA REPAIR    . KNEE ARTHROSCOPY  1999   meniscal torn L, Dr.Dalldorf  . QUADRICEPS TENDON REPAIR  [place]   [place] leg  . TOTAL KNEE ARTHROPLASTY  03/28/2012   LEFT TOTAL KNEE ARTHROPLASTY;  Surgeon: Hessie Dibble, MD;  Location: [place];  Service: Orthopedics;  Laterality: Left;  with revision tibia  . TOTAL KNEE ARTHROPLASTY  08/03/2012   Procedure: TOTAL KNEE ARTHROPLASTY;  Surgeon: Hessie Dibble, MD;  Location: [place];  Service: Orthopedics;  Laterality: Right;   Family History  Problem Relation Age of Onset  . Heart disease Mother     MI age onset 90s?  . Diabetes Mother   . Prostate cancer Father     dx age 47s  . Colon cancer Neg Hx    History  Sexual Activity  . Sexual activity: Yes    Outpatient Encounter Prescriptions as of 09/20/2016  Medication Sig  . atorvastatin (LIPITOR) 80 MG tablet Take 1 tablet (80 mg total) by mouth daily.  Marland Kitchen buPROPion (WELLBUTRIN SR) 150 MG 12 hr tablet Take 150 mg by mouth  2 (two) times daily.   . dorzolamide-timolol (COSOPT) 22.3-6.8 MG/ML ophthalmic solution Place 1 drop into both eyes 2 (two) times daily.  . fluconazole (DIFLUCAN) 200 MG tablet Take 200 mg by mouth daily. Reported on 10/29/2015  . glucose blood (FREESTYLE LITE) test strip Check blood sugar twice daily.  Marland Kitchen ibuprofen (ADVIL,MOTRIN) 800 MG tablet Take 800 mg by mouth 2 (two) times daily.  . Lancets (FREESTYLE) lancets Check once daily. Dx: 250.00  . levofloxacin (LEVAQUIN) 500 MG tablet Take 500 mg by mouth daily. Reported on 10/29/2015  . lidocaine-prilocaine (EMLA) cream Apply 1 application topically once. 1 hour prior to port access  . lisinopril (PRINIVIL,ZESTRIL) 10 MG tablet Take 1 tablet (10 mg total) by mouth daily.  . Magnesium Oxide 400 (240 MG) MG TABS Take 1 tablet by mouth 2 (two) times daily.  . metFORMIN (GLUCOPHAGE) 850 MG tablet Take 1 tablet (850 mg total) by mouth daily  with breakfast.  . Midostaurin (RYDAPT) 25 MG CAPS Take 50 mg by mouth 2 (two) times daily.  Marland Kitchen omeprazole (PRILOSEC) 40 MG capsule Take 1 capsule (40 mg total) by mouth daily.  . ondansetron (ZOFRAN) 8 MG tablet Take by mouth every 8 (eight) hours as needed for nausea or vomiting. Reported on 10/29/2015  . prochlorperazine (COMPAZINE) 10 MG tablet Take 10 mg by mouth every 6 (six) hours as needed for nausea or vomiting. Reported on 10/29/2015  . sertraline (ZOLOFT) 100 MG tablet Take 2 tablets (200 mg total) by mouth daily.   No facility-administered encounter medications on file as of 09/20/2016.     Activities of Daily Living In your present state of health, do you have any difficulty performing the following activities: 10/29/2015  Hearing? N  Vision? N  Difficulty concentrating or making decisions? N  Walking or climbing stairs? N  Dressing or bathing? N  Doing errands, shopping? N  Some recent data might be hidden    Patient Care Team: Colon Branch, MD as PCP - [place], MD as Referring Physician (Hematology and Oncology)   Assessment:    Physical assessment deferred to PCP.  Exercise Activities and Dietary recommendations   Diet (meal preparation, eat out, water intake, caffeinated beverages, dairy products, fruits and vegetables): {Desc; diets:16563} Breakfast: Lunch:  Dinner:      Goals    None     Fall Risk Fall Risk  10/29/2015 12/16/2014 11/20/2013 09/19/2012  Falls in the past year? No No No No   Depression Screen PHQ 2/9 Scores 10/29/2015 12/16/2014 11/20/2013 09/19/2012  PHQ - 2 Score 0 0 0 0    Cognitive Function        Immunization History  Administered Date(s) Administered  . Influenza Split 06/27/2012  . Influenza Whole 07/17/2008  . Influenza, High Dose Seasonal PF 06/27/2013, 05/21/2015  . Influenza-Unspecified 05/18/2016  . Pneumococcal Conjugate-13 12/15/2015  . Pneumococcal Polysaccharide-23 05/15/2009, 11/20/2013  . Td 05/15/2009  .  Zoster 07/16/2013   Screening Tests Health Maintenance  Topic Date Due  . HEMOGLOBIN A1C  04/30/2016  . OPHTHALMOLOGY EXAM  08/16/2016  . Hepatitis C Screening  09/18/2016 (Originally 11-Feb-1946)  . FOOT EXAM  10/28/2016  . COLONOSCOPY  05/08/2019  . TETANUS/TDAP  05/16/2019  . INFLUENZA VACCINE  Completed  . ZOSTAVAX  Completed  . PNA vac Low Risk Adult  Completed      Plan:    During the course of the visit the patient was educated and counseled about the following appropriate screening  and preventive services:   Vaccines to include Pneumoccal, Influenza, Hepatitis B, Td, Zostavax, HCV  Electrocardiogram  Cardiovascular Disease  Colorectal cancer screening  Diabetes screening  Prostate Cancer Screening  Glaucoma screening  Nutrition counseling   Smoking cessation counseling  Patient Instructions (the written plan) was given to the patient.    Shela Nevin, [place]  09/16/2016

## 2016-09-20 ENCOUNTER — Ambulatory Visit: Payer: TRICARE For Life (TFL) | Admitting: *Deleted

## 2016-09-20 ENCOUNTER — Ambulatory Visit: Payer: TRICARE For Life (TFL) | Admitting: Internal Medicine

## 2016-09-20 DIAGNOSIS — I872 Venous insufficiency (chronic) (peripheral): Secondary | ICD-10-CM | POA: Diagnosis not present

## 2016-09-20 DIAGNOSIS — C9202 Acute myeloblastic leukemia, in relapse: Secondary | ICD-10-CM | POA: Diagnosis not present

## 2016-09-20 DIAGNOSIS — Z5111 Encounter for antineoplastic chemotherapy: Secondary | ICD-10-CM | POA: Diagnosis not present

## 2016-09-20 DIAGNOSIS — L97311 Non-pressure chronic ulcer of right ankle limited to breakdown of skin: Secondary | ICD-10-CM | POA: Diagnosis not present

## 2016-09-24 DIAGNOSIS — E559 Vitamin D deficiency, unspecified: Secondary | ICD-10-CM | POA: Diagnosis not present

## 2016-09-24 DIAGNOSIS — K5900 Constipation, unspecified: Secondary | ICD-10-CM | POA: Diagnosis not present

## 2016-09-24 DIAGNOSIS — E785 Hyperlipidemia, unspecified: Secondary | ICD-10-CM | POA: Diagnosis not present

## 2016-09-24 DIAGNOSIS — Z7982 Long term (current) use of aspirin: Secondary | ICD-10-CM | POA: Diagnosis not present

## 2016-09-24 DIAGNOSIS — M109 Gout, unspecified: Secondary | ICD-10-CM | POA: Diagnosis not present

## 2016-09-24 DIAGNOSIS — E119 Type 2 diabetes mellitus without complications: Secondary | ICD-10-CM | POA: Diagnosis not present

## 2016-09-24 DIAGNOSIS — Z79899 Other long term (current) drug therapy: Secondary | ICD-10-CM | POA: Diagnosis not present

## 2016-09-24 DIAGNOSIS — F4310 Post-traumatic stress disorder, unspecified: Secondary | ICD-10-CM | POA: Diagnosis not present

## 2016-09-24 DIAGNOSIS — C9202 Acute myeloblastic leukemia, in relapse: Secondary | ICD-10-CM | POA: Diagnosis not present

## 2016-09-24 DIAGNOSIS — I4891 Unspecified atrial fibrillation: Secondary | ICD-10-CM | POA: Diagnosis not present

## 2016-09-24 DIAGNOSIS — R21 Rash and other nonspecific skin eruption: Secondary | ICD-10-CM | POA: Diagnosis not present

## 2016-09-24 DIAGNOSIS — I110 Hypertensive heart disease with heart failure: Secondary | ICD-10-CM | POA: Diagnosis not present

## 2016-09-24 DIAGNOSIS — D61818 Other pancytopenia: Secondary | ICD-10-CM | POA: Diagnosis not present

## 2016-09-24 DIAGNOSIS — Z9221 Personal history of antineoplastic chemotherapy: Secondary | ICD-10-CM | POA: Diagnosis not present

## 2016-09-24 DIAGNOSIS — G4700 Insomnia, unspecified: Secondary | ICD-10-CM | POA: Diagnosis not present

## 2016-09-24 DIAGNOSIS — I5042 Chronic combined systolic (congestive) and diastolic (congestive) heart failure: Secondary | ICD-10-CM | POA: Diagnosis not present

## 2016-09-24 DIAGNOSIS — Z7984 Long term (current) use of oral hypoglycemic drugs: Secondary | ICD-10-CM | POA: Diagnosis not present

## 2016-09-24 DIAGNOSIS — R110 Nausea: Secondary | ICD-10-CM | POA: Diagnosis not present

## 2016-09-24 LAB — CBC AND DIFFERENTIAL
HCT: 32 % — AB (ref 41–53)
HEMOGLOBIN: 10.7 g/dL — AB (ref 13.5–17.5)
Platelets: 120 10*3/uL — AB (ref 150–399)
WBC: 1.4 10*3/mL

## 2016-09-24 LAB — BASIC METABOLIC PANEL
BUN: 21 mg/dL (ref 4–21)
CREATININE: 0.8 mg/dL (ref 0.6–1.3)
Glucose: 162 mg/dL
POTASSIUM: 4.5 mmol/L (ref 3.4–5.3)
SODIUM: 132 mmol/L — AB (ref 137–147)

## 2016-09-24 LAB — HEPATIC FUNCTION PANEL
ALT: 64 U/L — AB (ref 10–40)
AST: 30 U/L (ref 14–40)
Alkaline Phosphatase: 94 U/L (ref 25–125)
Bilirubin, Total: 0.4 mg/dL

## 2016-09-28 DIAGNOSIS — I5040 Unspecified combined systolic (congestive) and diastolic (congestive) heart failure: Secondary | ICD-10-CM | POA: Diagnosis not present

## 2016-09-28 DIAGNOSIS — Z7984 Long term (current) use of oral hypoglycemic drugs: Secondary | ICD-10-CM | POA: Diagnosis not present

## 2016-09-28 DIAGNOSIS — C9202 Acute myeloblastic leukemia, in relapse: Secondary | ICD-10-CM | POA: Diagnosis not present

## 2016-09-28 DIAGNOSIS — Z7982 Long term (current) use of aspirin: Secondary | ICD-10-CM | POA: Diagnosis not present

## 2016-09-28 DIAGNOSIS — G4700 Insomnia, unspecified: Secondary | ICD-10-CM | POA: Diagnosis not present

## 2016-09-28 DIAGNOSIS — E119 Type 2 diabetes mellitus without complications: Secondary | ICD-10-CM | POA: Diagnosis not present

## 2016-09-28 DIAGNOSIS — E559 Vitamin D deficiency, unspecified: Secondary | ICD-10-CM | POA: Diagnosis not present

## 2016-09-28 DIAGNOSIS — I4891 Unspecified atrial fibrillation: Secondary | ICD-10-CM | POA: Diagnosis not present

## 2016-09-28 DIAGNOSIS — R21 Rash and other nonspecific skin eruption: Secondary | ICD-10-CM | POA: Diagnosis not present

## 2016-09-28 DIAGNOSIS — M7731 Calcaneal spur, right foot: Secondary | ICD-10-CM | POA: Diagnosis not present

## 2016-09-28 DIAGNOSIS — R110 Nausea: Secondary | ICD-10-CM | POA: Diagnosis not present

## 2016-09-28 DIAGNOSIS — Z79899 Other long term (current) drug therapy: Secondary | ICD-10-CM | POA: Diagnosis not present

## 2016-09-28 DIAGNOSIS — Z8619 Personal history of other infectious and parasitic diseases: Secondary | ICD-10-CM | POA: Diagnosis not present

## 2016-09-28 DIAGNOSIS — Z95828 Presence of other vascular implants and grafts: Secondary | ICD-10-CM | POA: Diagnosis not present

## 2016-09-28 DIAGNOSIS — F4310 Post-traumatic stress disorder, unspecified: Secondary | ICD-10-CM | POA: Diagnosis not present

## 2016-09-28 DIAGNOSIS — E785 Hyperlipidemia, unspecified: Secondary | ICD-10-CM | POA: Diagnosis not present

## 2016-09-28 DIAGNOSIS — K5900 Constipation, unspecified: Secondary | ICD-10-CM | POA: Diagnosis not present

## 2016-09-28 DIAGNOSIS — I110 Hypertensive heart disease with heart failure: Secondary | ICD-10-CM | POA: Diagnosis not present

## 2016-09-28 DIAGNOSIS — Z8739 Personal history of other diseases of the musculoskeletal system and connective tissue: Secondary | ICD-10-CM | POA: Diagnosis not present

## 2016-09-28 DIAGNOSIS — I10 Essential (primary) hypertension: Secondary | ICD-10-CM | POA: Diagnosis not present

## 2016-09-29 DIAGNOSIS — I10 Essential (primary) hypertension: Secondary | ICD-10-CM | POA: Diagnosis not present

## 2016-09-29 DIAGNOSIS — L97311 Non-pressure chronic ulcer of right ankle limited to breakdown of skin: Secondary | ICD-10-CM | POA: Diagnosis not present

## 2016-09-29 DIAGNOSIS — I872 Venous insufficiency (chronic) (peripheral): Secondary | ICD-10-CM | POA: Diagnosis not present

## 2016-09-29 DIAGNOSIS — E11622 Type 2 diabetes mellitus with other skin ulcer: Secondary | ICD-10-CM | POA: Diagnosis not present

## 2016-09-29 DIAGNOSIS — C9200 Acute myeloblastic leukemia, not having achieved remission: Secondary | ICD-10-CM | POA: Diagnosis not present

## 2016-10-01 DIAGNOSIS — E785 Hyperlipidemia, unspecified: Secondary | ICD-10-CM | POA: Diagnosis not present

## 2016-10-01 DIAGNOSIS — I10 Essential (primary) hypertension: Secondary | ICD-10-CM | POA: Diagnosis not present

## 2016-10-01 DIAGNOSIS — M25579 Pain in unspecified ankle and joints of unspecified foot: Secondary | ICD-10-CM | POA: Diagnosis not present

## 2016-10-01 DIAGNOSIS — C9202 Acute myeloblastic leukemia, in relapse: Secondary | ICD-10-CM | POA: Diagnosis not present

## 2016-10-01 DIAGNOSIS — F4310 Post-traumatic stress disorder, unspecified: Secondary | ICD-10-CM | POA: Diagnosis not present

## 2016-10-01 DIAGNOSIS — E119 Type 2 diabetes mellitus without complications: Secondary | ICD-10-CM | POA: Diagnosis not present

## 2016-10-05 ENCOUNTER — Encounter: Payer: Self-pay | Admitting: Internal Medicine

## 2016-10-05 DIAGNOSIS — N179 Acute kidney failure, unspecified: Secondary | ICD-10-CM | POA: Diagnosis not present

## 2016-10-05 DIAGNOSIS — L97311 Non-pressure chronic ulcer of right ankle limited to breakdown of skin: Secondary | ICD-10-CM | POA: Diagnosis not present

## 2016-10-05 DIAGNOSIS — D709 Neutropenia, unspecified: Secondary | ICD-10-CM | POA: Diagnosis not present

## 2016-10-05 DIAGNOSIS — I10 Essential (primary) hypertension: Secondary | ICD-10-CM | POA: Diagnosis not present

## 2016-10-05 DIAGNOSIS — E871 Hypo-osmolality and hyponatremia: Secondary | ICD-10-CM | POA: Diagnosis not present

## 2016-10-05 DIAGNOSIS — I5042 Chronic combined systolic (congestive) and diastolic (congestive) heart failure: Secondary | ICD-10-CM | POA: Diagnosis not present

## 2016-10-05 DIAGNOSIS — C9202 Acute myeloblastic leukemia, in relapse: Secondary | ICD-10-CM | POA: Diagnosis not present

## 2016-10-05 DIAGNOSIS — I878 Other specified disorders of veins: Secondary | ICD-10-CM | POA: Diagnosis not present

## 2016-10-05 DIAGNOSIS — R918 Other nonspecific abnormal finding of lung field: Secondary | ICD-10-CM | POA: Diagnosis not present

## 2016-10-05 DIAGNOSIS — R509 Fever, unspecified: Secondary | ICD-10-CM | POA: Diagnosis not present

## 2016-10-05 DIAGNOSIS — D61818 Other pancytopenia: Secondary | ICD-10-CM | POA: Diagnosis not present

## 2016-10-05 DIAGNOSIS — B999 Unspecified infectious disease: Secondary | ICD-10-CM | POA: Diagnosis not present

## 2016-10-06 DIAGNOSIS — R7881 Bacteremia: Secondary | ICD-10-CM | POA: Diagnosis not present

## 2016-10-06 DIAGNOSIS — I8003 Phlebitis and thrombophlebitis of superficial vessels of lower extremities, bilateral: Secondary | ICD-10-CM | POA: Diagnosis not present

## 2016-10-06 DIAGNOSIS — C9200 Acute myeloblastic leukemia, not having achieved remission: Secondary | ICD-10-CM | POA: Diagnosis not present

## 2016-10-06 DIAGNOSIS — I110 Hypertensive heart disease with heart failure: Secondary | ICD-10-CM | POA: Diagnosis present

## 2016-10-06 DIAGNOSIS — I4891 Unspecified atrial fibrillation: Secondary | ICD-10-CM | POA: Diagnosis present

## 2016-10-06 DIAGNOSIS — R509 Fever, unspecified: Secondary | ICD-10-CM | POA: Diagnosis not present

## 2016-10-06 DIAGNOSIS — R0989 Other specified symptoms and signs involving the circulatory and respiratory systems: Secondary | ICD-10-CM | POA: Diagnosis not present

## 2016-10-06 DIAGNOSIS — K219 Gastro-esophageal reflux disease without esophagitis: Secondary | ICD-10-CM | POA: Diagnosis present

## 2016-10-06 DIAGNOSIS — Z888 Allergy status to other drugs, medicaments and biological substances status: Secondary | ICD-10-CM | POA: Diagnosis not present

## 2016-10-06 DIAGNOSIS — I878 Other specified disorders of veins: Secondary | ICD-10-CM | POA: Diagnosis not present

## 2016-10-06 DIAGNOSIS — D61818 Other pancytopenia: Secondary | ICD-10-CM | POA: Diagnosis not present

## 2016-10-06 DIAGNOSIS — Z9221 Personal history of antineoplastic chemotherapy: Secondary | ICD-10-CM | POA: Diagnosis not present

## 2016-10-06 DIAGNOSIS — L97509 Non-pressure chronic ulcer of other part of unspecified foot with unspecified severity: Secondary | ICD-10-CM | POA: Diagnosis present

## 2016-10-06 DIAGNOSIS — R000 Tachycardia, unspecified: Secondary | ICD-10-CM | POA: Diagnosis not present

## 2016-10-06 DIAGNOSIS — D899 Disorder involving the immune mechanism, unspecified: Secondary | ICD-10-CM | POA: Diagnosis not present

## 2016-10-06 DIAGNOSIS — B9620 Unspecified Escherichia coli [E. coli] as the cause of diseases classified elsewhere: Secondary | ICD-10-CM | POA: Diagnosis not present

## 2016-10-06 DIAGNOSIS — Z87891 Personal history of nicotine dependence: Secondary | ICD-10-CM | POA: Diagnosis not present

## 2016-10-06 DIAGNOSIS — N179 Acute kidney failure, unspecified: Secondary | ICD-10-CM | POA: Diagnosis not present

## 2016-10-06 DIAGNOSIS — C9202 Acute myeloblastic leukemia, in relapse: Secondary | ICD-10-CM | POA: Diagnosis present

## 2016-10-06 DIAGNOSIS — L03115 Cellulitis of right lower limb: Secondary | ICD-10-CM | POA: Diagnosis not present

## 2016-10-06 DIAGNOSIS — Z95828 Presence of other vascular implants and grafts: Secondary | ICD-10-CM | POA: Diagnosis not present

## 2016-10-06 DIAGNOSIS — E119 Type 2 diabetes mellitus without complications: Secondary | ICD-10-CM | POA: Diagnosis not present

## 2016-10-06 DIAGNOSIS — E871 Hypo-osmolality and hyponatremia: Secondary | ICD-10-CM | POA: Diagnosis present

## 2016-10-06 DIAGNOSIS — B952 Enterococcus as the cause of diseases classified elsewhere: Secondary | ICD-10-CM | POA: Diagnosis not present

## 2016-10-06 DIAGNOSIS — I5042 Chronic combined systolic (congestive) and diastolic (congestive) heart failure: Secondary | ICD-10-CM | POA: Diagnosis present

## 2016-10-06 DIAGNOSIS — F4310 Post-traumatic stress disorder, unspecified: Secondary | ICD-10-CM | POA: Diagnosis present

## 2016-10-06 DIAGNOSIS — B999 Unspecified infectious disease: Secondary | ICD-10-CM | POA: Diagnosis present

## 2016-10-06 DIAGNOSIS — F329 Major depressive disorder, single episode, unspecified: Secondary | ICD-10-CM | POA: Diagnosis not present

## 2016-10-06 DIAGNOSIS — F339 Major depressive disorder, recurrent, unspecified: Secondary | ICD-10-CM | POA: Diagnosis not present

## 2016-10-06 DIAGNOSIS — E11621 Type 2 diabetes mellitus with foot ulcer: Secondary | ICD-10-CM | POA: Diagnosis present

## 2016-10-06 DIAGNOSIS — I10 Essential (primary) hypertension: Secondary | ICD-10-CM | POA: Diagnosis not present

## 2016-10-06 DIAGNOSIS — E785 Hyperlipidemia, unspecified: Secondary | ICD-10-CM | POA: Diagnosis present

## 2016-10-06 DIAGNOSIS — R5081 Fever presenting with conditions classified elsewhere: Secondary | ICD-10-CM | POA: Diagnosis not present

## 2016-10-06 DIAGNOSIS — E861 Hypovolemia: Secondary | ICD-10-CM | POA: Diagnosis present

## 2016-10-06 DIAGNOSIS — F419 Anxiety disorder, unspecified: Secondary | ICD-10-CM | POA: Diagnosis present

## 2016-10-06 DIAGNOSIS — L97909 Non-pressure chronic ulcer of unspecified part of unspecified lower leg with unspecified severity: Secondary | ICD-10-CM | POA: Diagnosis not present

## 2016-10-06 DIAGNOSIS — D709 Neutropenia, unspecified: Secondary | ICD-10-CM | POA: Diagnosis not present

## 2016-10-06 DIAGNOSIS — I87311 Chronic venous hypertension (idiopathic) with ulcer of right lower extremity: Secondary | ICD-10-CM | POA: Diagnosis not present

## 2016-10-06 DIAGNOSIS — I444 Left anterior fascicular block: Secondary | ICD-10-CM | POA: Diagnosis not present

## 2016-10-06 DIAGNOSIS — I872 Venous insufficiency (chronic) (peripheral): Secondary | ICD-10-CM | POA: Diagnosis not present

## 2016-10-06 DIAGNOSIS — R918 Other nonspecific abnormal finding of lung field: Secondary | ICD-10-CM | POA: Diagnosis not present

## 2016-10-06 DIAGNOSIS — Z452 Encounter for adjustment and management of vascular access device: Secondary | ICD-10-CM | POA: Diagnosis not present

## 2016-10-06 DIAGNOSIS — L97311 Non-pressure chronic ulcer of right ankle limited to breakdown of skin: Secondary | ICD-10-CM | POA: Diagnosis not present

## 2016-10-06 DIAGNOSIS — I83013 Varicose veins of right lower extremity with ulcer of ankle: Secondary | ICD-10-CM | POA: Diagnosis present

## 2016-10-22 DIAGNOSIS — F4310 Post-traumatic stress disorder, unspecified: Secondary | ICD-10-CM | POA: Diagnosis not present

## 2016-10-22 DIAGNOSIS — I10 Essential (primary) hypertension: Secondary | ICD-10-CM | POA: Diagnosis not present

## 2016-10-22 DIAGNOSIS — Z9221 Personal history of antineoplastic chemotherapy: Secondary | ICD-10-CM | POA: Diagnosis not present

## 2016-10-22 DIAGNOSIS — M79671 Pain in right foot: Secondary | ICD-10-CM | POA: Diagnosis not present

## 2016-10-22 DIAGNOSIS — E559 Vitamin D deficiency, unspecified: Secondary | ICD-10-CM | POA: Diagnosis not present

## 2016-10-22 DIAGNOSIS — Z79899 Other long term (current) drug therapy: Secondary | ICD-10-CM | POA: Diagnosis not present

## 2016-10-22 DIAGNOSIS — C9202 Acute myeloblastic leukemia, in relapse: Secondary | ICD-10-CM | POA: Diagnosis not present

## 2016-10-22 DIAGNOSIS — M109 Gout, unspecified: Secondary | ICD-10-CM | POA: Diagnosis not present

## 2016-10-22 DIAGNOSIS — I4891 Unspecified atrial fibrillation: Secondary | ICD-10-CM | POA: Diagnosis not present

## 2016-10-22 DIAGNOSIS — Z7984 Long term (current) use of oral hypoglycemic drugs: Secondary | ICD-10-CM | POA: Diagnosis not present

## 2016-10-22 DIAGNOSIS — G4700 Insomnia, unspecified: Secondary | ICD-10-CM | POA: Diagnosis not present

## 2016-10-22 DIAGNOSIS — K5900 Constipation, unspecified: Secondary | ICD-10-CM | POA: Diagnosis not present

## 2016-10-22 DIAGNOSIS — I110 Hypertensive heart disease with heart failure: Secondary | ICD-10-CM | POA: Diagnosis not present

## 2016-10-22 DIAGNOSIS — E785 Hyperlipidemia, unspecified: Secondary | ICD-10-CM | POA: Diagnosis not present

## 2016-10-22 DIAGNOSIS — R110 Nausea: Secondary | ICD-10-CM | POA: Diagnosis not present

## 2016-10-22 DIAGNOSIS — Z7982 Long term (current) use of aspirin: Secondary | ICD-10-CM | POA: Diagnosis not present

## 2016-10-22 DIAGNOSIS — E119 Type 2 diabetes mellitus without complications: Secondary | ICD-10-CM | POA: Diagnosis not present

## 2016-10-22 DIAGNOSIS — I5042 Chronic combined systolic (congestive) and diastolic (congestive) heart failure: Secondary | ICD-10-CM | POA: Diagnosis not present

## 2016-10-27 DIAGNOSIS — I10 Essential (primary) hypertension: Secondary | ICD-10-CM | POA: Diagnosis not present

## 2016-10-27 DIAGNOSIS — I878 Other specified disorders of veins: Secondary | ICD-10-CM | POA: Diagnosis not present

## 2016-10-27 DIAGNOSIS — I872 Venous insufficiency (chronic) (peripheral): Secondary | ICD-10-CM | POA: Diagnosis not present

## 2016-10-27 DIAGNOSIS — L97311 Non-pressure chronic ulcer of right ankle limited to breakdown of skin: Secondary | ICD-10-CM | POA: Diagnosis not present

## 2016-10-27 DIAGNOSIS — E11622 Type 2 diabetes mellitus with other skin ulcer: Secondary | ICD-10-CM | POA: Diagnosis not present

## 2016-10-27 DIAGNOSIS — I771 Stricture of artery: Secondary | ICD-10-CM | POA: Diagnosis not present

## 2016-10-27 DIAGNOSIS — Z86718 Personal history of other venous thrombosis and embolism: Secondary | ICD-10-CM | POA: Diagnosis not present

## 2016-10-29 DIAGNOSIS — I110 Hypertensive heart disease with heart failure: Secondary | ICD-10-CM | POA: Diagnosis not present

## 2016-10-29 DIAGNOSIS — R5081 Fever presenting with conditions classified elsewhere: Secondary | ICD-10-CM | POA: Diagnosis not present

## 2016-10-29 DIAGNOSIS — G4700 Insomnia, unspecified: Secondary | ICD-10-CM | POA: Diagnosis not present

## 2016-10-29 DIAGNOSIS — Z79899 Other long term (current) drug therapy: Secondary | ICD-10-CM | POA: Diagnosis not present

## 2016-10-29 DIAGNOSIS — E785 Hyperlipidemia, unspecified: Secondary | ICD-10-CM | POA: Diagnosis not present

## 2016-10-29 DIAGNOSIS — E119 Type 2 diabetes mellitus without complications: Secondary | ICD-10-CM | POA: Diagnosis not present

## 2016-10-29 DIAGNOSIS — Z7982 Long term (current) use of aspirin: Secondary | ICD-10-CM | POA: Diagnosis not present

## 2016-10-29 DIAGNOSIS — C9202 Acute myeloblastic leukemia, in relapse: Secondary | ICD-10-CM | POA: Diagnosis not present

## 2016-10-29 DIAGNOSIS — I5042 Chronic combined systolic (congestive) and diastolic (congestive) heart failure: Secondary | ICD-10-CM | POA: Diagnosis not present

## 2016-10-29 DIAGNOSIS — Z7984 Long term (current) use of oral hypoglycemic drugs: Secondary | ICD-10-CM | POA: Diagnosis not present

## 2016-10-29 DIAGNOSIS — M109 Gout, unspecified: Secondary | ICD-10-CM | POA: Diagnosis not present

## 2016-11-03 DIAGNOSIS — I10 Essential (primary) hypertension: Secondary | ICD-10-CM | POA: Diagnosis not present

## 2016-11-03 DIAGNOSIS — C9200 Acute myeloblastic leukemia, not having achieved remission: Secondary | ICD-10-CM | POA: Diagnosis not present

## 2016-11-03 DIAGNOSIS — E119 Type 2 diabetes mellitus without complications: Secondary | ICD-10-CM | POA: Diagnosis not present

## 2016-11-03 DIAGNOSIS — L97311 Non-pressure chronic ulcer of right ankle limited to breakdown of skin: Secondary | ICD-10-CM | POA: Diagnosis not present

## 2016-11-03 DIAGNOSIS — I872 Venous insufficiency (chronic) (peripheral): Secondary | ICD-10-CM | POA: Diagnosis not present

## 2016-11-05 DIAGNOSIS — T451X5A Adverse effect of antineoplastic and immunosuppressive drugs, initial encounter: Secondary | ICD-10-CM | POA: Diagnosis not present

## 2016-11-05 DIAGNOSIS — D696 Thrombocytopenia, unspecified: Secondary | ICD-10-CM | POA: Diagnosis not present

## 2016-11-05 DIAGNOSIS — I110 Hypertensive heart disease with heart failure: Secondary | ICD-10-CM | POA: Diagnosis not present

## 2016-11-05 DIAGNOSIS — Z7984 Long term (current) use of oral hypoglycemic drugs: Secondary | ICD-10-CM | POA: Diagnosis not present

## 2016-11-05 DIAGNOSIS — I5040 Unspecified combined systolic (congestive) and diastolic (congestive) heart failure: Secondary | ICD-10-CM | POA: Diagnosis not present

## 2016-11-05 DIAGNOSIS — I499 Cardiac arrhythmia, unspecified: Secondary | ICD-10-CM | POA: Diagnosis not present

## 2016-11-05 DIAGNOSIS — Z79899 Other long term (current) drug therapy: Secondary | ICD-10-CM | POA: Diagnosis not present

## 2016-11-05 DIAGNOSIS — Z7982 Long term (current) use of aspirin: Secondary | ICD-10-CM | POA: Diagnosis not present

## 2016-11-05 DIAGNOSIS — I4891 Unspecified atrial fibrillation: Secondary | ICD-10-CM | POA: Diagnosis not present

## 2016-11-05 DIAGNOSIS — E119 Type 2 diabetes mellitus without complications: Secondary | ICD-10-CM | POA: Diagnosis not present

## 2016-11-05 DIAGNOSIS — D709 Neutropenia, unspecified: Secondary | ICD-10-CM | POA: Diagnosis not present

## 2016-11-05 DIAGNOSIS — R21 Rash and other nonspecific skin eruption: Secondary | ICD-10-CM | POA: Diagnosis not present

## 2016-11-05 DIAGNOSIS — E559 Vitamin D deficiency, unspecified: Secondary | ICD-10-CM | POA: Diagnosis not present

## 2016-11-05 DIAGNOSIS — R110 Nausea: Secondary | ICD-10-CM | POA: Diagnosis not present

## 2016-11-05 DIAGNOSIS — R531 Weakness: Secondary | ICD-10-CM | POA: Diagnosis not present

## 2016-11-05 DIAGNOSIS — Z881 Allergy status to other antibiotic agents status: Secondary | ICD-10-CM | POA: Diagnosis not present

## 2016-11-05 DIAGNOSIS — M79671 Pain in right foot: Secondary | ICD-10-CM | POA: Diagnosis not present

## 2016-11-05 DIAGNOSIS — K5903 Drug induced constipation: Secondary | ICD-10-CM | POA: Diagnosis not present

## 2016-11-05 DIAGNOSIS — Z9221 Personal history of antineoplastic chemotherapy: Secondary | ICD-10-CM | POA: Diagnosis not present

## 2016-11-05 DIAGNOSIS — R2689 Other abnormalities of gait and mobility: Secondary | ICD-10-CM | POA: Diagnosis not present

## 2016-11-05 DIAGNOSIS — F4310 Post-traumatic stress disorder, unspecified: Secondary | ICD-10-CM | POA: Diagnosis not present

## 2016-11-05 DIAGNOSIS — R609 Edema, unspecified: Secondary | ICD-10-CM | POA: Diagnosis not present

## 2016-11-05 DIAGNOSIS — R2681 Unsteadiness on feet: Secondary | ICD-10-CM | POA: Diagnosis not present

## 2016-11-05 DIAGNOSIS — C9202 Acute myeloblastic leukemia, in relapse: Secondary | ICD-10-CM | POA: Diagnosis not present

## 2016-11-05 DIAGNOSIS — G4700 Insomnia, unspecified: Secondary | ICD-10-CM | POA: Diagnosis not present

## 2016-11-05 DIAGNOSIS — E785 Hyperlipidemia, unspecified: Secondary | ICD-10-CM | POA: Diagnosis not present

## 2016-11-05 DIAGNOSIS — I5042 Chronic combined systolic (congestive) and diastolic (congestive) heart failure: Secondary | ICD-10-CM | POA: Diagnosis not present

## 2016-11-12 DIAGNOSIS — Z79899 Other long term (current) drug therapy: Secondary | ICD-10-CM | POA: Diagnosis not present

## 2016-11-12 DIAGNOSIS — K5900 Constipation, unspecified: Secondary | ICD-10-CM | POA: Diagnosis not present

## 2016-11-12 DIAGNOSIS — I5030 Unspecified diastolic (congestive) heart failure: Secondary | ICD-10-CM | POA: Diagnosis not present

## 2016-11-12 DIAGNOSIS — I110 Hypertensive heart disease with heart failure: Secondary | ICD-10-CM | POA: Diagnosis not present

## 2016-11-12 DIAGNOSIS — Z7982 Long term (current) use of aspirin: Secondary | ICD-10-CM | POA: Diagnosis not present

## 2016-11-12 DIAGNOSIS — I5040 Unspecified combined systolic (congestive) and diastolic (congestive) heart failure: Secondary | ICD-10-CM | POA: Diagnosis not present

## 2016-11-12 DIAGNOSIS — E785 Hyperlipidemia, unspecified: Secondary | ICD-10-CM | POA: Diagnosis not present

## 2016-11-12 DIAGNOSIS — C9202 Acute myeloblastic leukemia, in relapse: Secondary | ICD-10-CM | POA: Diagnosis not present

## 2016-11-12 DIAGNOSIS — G4700 Insomnia, unspecified: Secondary | ICD-10-CM | POA: Diagnosis not present

## 2016-11-12 DIAGNOSIS — E119 Type 2 diabetes mellitus without complications: Secondary | ICD-10-CM | POA: Diagnosis not present

## 2016-11-12 DIAGNOSIS — I5020 Unspecified systolic (congestive) heart failure: Secondary | ICD-10-CM | POA: Diagnosis not present

## 2016-11-12 DIAGNOSIS — K5903 Drug induced constipation: Secondary | ICD-10-CM | POA: Diagnosis not present

## 2016-11-12 DIAGNOSIS — Z9221 Personal history of antineoplastic chemotherapy: Secondary | ICD-10-CM | POA: Diagnosis not present

## 2016-11-12 DIAGNOSIS — Z7984 Long term (current) use of oral hypoglycemic drugs: Secondary | ICD-10-CM | POA: Diagnosis not present

## 2016-11-12 DIAGNOSIS — R110 Nausea: Secondary | ICD-10-CM | POA: Diagnosis not present

## 2016-11-12 DIAGNOSIS — E559 Vitamin D deficiency, unspecified: Secondary | ICD-10-CM | POA: Diagnosis not present

## 2016-11-12 DIAGNOSIS — F4310 Post-traumatic stress disorder, unspecified: Secondary | ICD-10-CM | POA: Diagnosis not present

## 2016-11-12 DIAGNOSIS — D709 Neutropenia, unspecified: Secondary | ICD-10-CM | POA: Diagnosis not present

## 2016-11-16 DIAGNOSIS — G4700 Insomnia, unspecified: Secondary | ICD-10-CM | POA: Diagnosis not present

## 2016-11-16 DIAGNOSIS — I872 Venous insufficiency (chronic) (peripheral): Secondary | ICD-10-CM | POA: Diagnosis not present

## 2016-11-16 DIAGNOSIS — D709 Neutropenia, unspecified: Secondary | ICD-10-CM | POA: Diagnosis not present

## 2016-11-16 DIAGNOSIS — R2681 Unsteadiness on feet: Secondary | ICD-10-CM | POA: Diagnosis not present

## 2016-11-16 DIAGNOSIS — I5040 Unspecified combined systolic (congestive) and diastolic (congestive) heart failure: Secondary | ICD-10-CM | POA: Diagnosis not present

## 2016-11-16 DIAGNOSIS — L97311 Non-pressure chronic ulcer of right ankle limited to breakdown of skin: Secondary | ICD-10-CM | POA: Diagnosis not present

## 2016-11-16 DIAGNOSIS — F4310 Post-traumatic stress disorder, unspecified: Secondary | ICD-10-CM | POA: Diagnosis not present

## 2016-11-16 DIAGNOSIS — I110 Hypertensive heart disease with heart failure: Secondary | ICD-10-CM | POA: Diagnosis not present

## 2016-11-16 DIAGNOSIS — R609 Edema, unspecified: Secondary | ICD-10-CM | POA: Diagnosis not present

## 2016-11-16 DIAGNOSIS — C9202 Acute myeloblastic leukemia, in relapse: Secondary | ICD-10-CM | POA: Diagnosis not present

## 2016-11-19 DIAGNOSIS — C9202 Acute myeloblastic leukemia, in relapse: Secondary | ICD-10-CM | POA: Diagnosis not present

## 2016-11-23 DIAGNOSIS — C9202 Acute myeloblastic leukemia, in relapse: Secondary | ICD-10-CM | POA: Diagnosis not present

## 2016-11-23 DIAGNOSIS — I4891 Unspecified atrial fibrillation: Secondary | ICD-10-CM | POA: Diagnosis not present

## 2016-11-23 DIAGNOSIS — G4700 Insomnia, unspecified: Secondary | ICD-10-CM | POA: Diagnosis not present

## 2016-11-23 DIAGNOSIS — E119 Type 2 diabetes mellitus without complications: Secondary | ICD-10-CM | POA: Diagnosis not present

## 2016-11-23 DIAGNOSIS — K5903 Drug induced constipation: Secondary | ICD-10-CM | POA: Diagnosis not present

## 2016-11-23 DIAGNOSIS — K5900 Constipation, unspecified: Secondary | ICD-10-CM | POA: Diagnosis not present

## 2016-11-23 DIAGNOSIS — I110 Hypertensive heart disease with heart failure: Secondary | ICD-10-CM | POA: Diagnosis not present

## 2016-11-23 DIAGNOSIS — Z7984 Long term (current) use of oral hypoglycemic drugs: Secondary | ICD-10-CM | POA: Diagnosis not present

## 2016-11-23 DIAGNOSIS — I5040 Unspecified combined systolic (congestive) and diastolic (congestive) heart failure: Secondary | ICD-10-CM | POA: Diagnosis not present

## 2016-11-23 DIAGNOSIS — E559 Vitamin D deficiency, unspecified: Secondary | ICD-10-CM | POA: Diagnosis not present

## 2016-11-23 DIAGNOSIS — Z79899 Other long term (current) drug therapy: Secondary | ICD-10-CM | POA: Diagnosis not present

## 2016-11-23 DIAGNOSIS — E785 Hyperlipidemia, unspecified: Secondary | ICD-10-CM | POA: Diagnosis not present

## 2016-11-23 DIAGNOSIS — R21 Rash and other nonspecific skin eruption: Secondary | ICD-10-CM | POA: Diagnosis not present

## 2016-11-23 DIAGNOSIS — F4310 Post-traumatic stress disorder, unspecified: Secondary | ICD-10-CM | POA: Diagnosis not present

## 2016-11-23 DIAGNOSIS — Z8739 Personal history of other diseases of the musculoskeletal system and connective tissue: Secondary | ICD-10-CM | POA: Diagnosis not present

## 2016-11-23 DIAGNOSIS — R110 Nausea: Secondary | ICD-10-CM | POA: Diagnosis not present

## 2016-11-26 DIAGNOSIS — D696 Thrombocytopenia, unspecified: Secondary | ICD-10-CM | POA: Diagnosis not present

## 2016-11-26 DIAGNOSIS — G4700 Insomnia, unspecified: Secondary | ICD-10-CM | POA: Diagnosis not present

## 2016-11-26 DIAGNOSIS — J351 Hypertrophy of tonsils: Secondary | ICD-10-CM | POA: Diagnosis not present

## 2016-11-26 DIAGNOSIS — D709 Neutropenia, unspecified: Secondary | ICD-10-CM | POA: Diagnosis not present

## 2016-11-26 DIAGNOSIS — I071 Rheumatic tricuspid insufficiency: Secondary | ICD-10-CM | POA: Diagnosis not present

## 2016-11-26 DIAGNOSIS — R609 Edema, unspecified: Secondary | ICD-10-CM | POA: Diagnosis not present

## 2016-11-26 DIAGNOSIS — I4891 Unspecified atrial fibrillation: Secondary | ICD-10-CM | POA: Diagnosis not present

## 2016-11-26 DIAGNOSIS — R1310 Dysphagia, unspecified: Secondary | ICD-10-CM | POA: Diagnosis not present

## 2016-11-26 DIAGNOSIS — I082 Rheumatic disorders of both aortic and tricuspid valves: Secondary | ICD-10-CM | POA: Diagnosis not present

## 2016-11-26 DIAGNOSIS — C9202 Acute myeloblastic leukemia, in relapse: Secondary | ICD-10-CM | POA: Diagnosis not present

## 2016-11-26 DIAGNOSIS — R21 Rash and other nonspecific skin eruption: Secondary | ICD-10-CM | POA: Diagnosis not present

## 2016-11-26 DIAGNOSIS — C9200 Acute myeloblastic leukemia, not having achieved remission: Secondary | ICD-10-CM | POA: Diagnosis not present

## 2016-11-26 DIAGNOSIS — R5081 Fever presenting with conditions classified elsewhere: Secondary | ICD-10-CM | POA: Diagnosis not present

## 2016-11-26 DIAGNOSIS — Z79899 Other long term (current) drug therapy: Secondary | ICD-10-CM | POA: Diagnosis not present

## 2016-11-26 DIAGNOSIS — F4310 Post-traumatic stress disorder, unspecified: Secondary | ICD-10-CM | POA: Diagnosis not present

## 2016-11-26 DIAGNOSIS — T451X5S Adverse effect of antineoplastic and immunosuppressive drugs, sequela: Secondary | ICD-10-CM | POA: Diagnosis not present

## 2016-11-26 DIAGNOSIS — I517 Cardiomegaly: Secondary | ICD-10-CM | POA: Diagnosis not present

## 2016-11-26 DIAGNOSIS — Z7982 Long term (current) use of aspirin: Secondary | ICD-10-CM | POA: Diagnosis not present

## 2016-11-26 DIAGNOSIS — I110 Hypertensive heart disease with heart failure: Secondary | ICD-10-CM | POA: Diagnosis not present

## 2016-11-26 DIAGNOSIS — K5900 Constipation, unspecified: Secondary | ICD-10-CM | POA: Diagnosis not present

## 2016-11-26 DIAGNOSIS — I272 Pulmonary hypertension, unspecified: Secondary | ICD-10-CM | POA: Diagnosis not present

## 2016-11-26 DIAGNOSIS — I5040 Unspecified combined systolic (congestive) and diastolic (congestive) heart failure: Secondary | ICD-10-CM | POA: Diagnosis not present

## 2016-11-26 DIAGNOSIS — E119 Type 2 diabetes mellitus without complications: Secondary | ICD-10-CM | POA: Diagnosis not present

## 2016-11-26 DIAGNOSIS — Z7984 Long term (current) use of oral hypoglycemic drugs: Secondary | ICD-10-CM | POA: Diagnosis not present

## 2016-11-26 DIAGNOSIS — R220 Localized swelling, mass and lump, head: Secondary | ICD-10-CM | POA: Diagnosis not present

## 2016-11-26 DIAGNOSIS — E785 Hyperlipidemia, unspecified: Secondary | ICD-10-CM | POA: Diagnosis not present

## 2016-11-30 DIAGNOSIS — R5081 Fever presenting with conditions classified elsewhere: Secondary | ICD-10-CM | POA: Diagnosis present

## 2016-11-30 DIAGNOSIS — L89322 Pressure ulcer of left buttock, stage 2: Secondary | ICD-10-CM | POA: Diagnosis present

## 2016-11-30 DIAGNOSIS — F329 Major depressive disorder, single episode, unspecified: Secondary | ICD-10-CM | POA: Diagnosis present

## 2016-11-30 DIAGNOSIS — J159 Unspecified bacterial pneumonia: Secondary | ICD-10-CM | POA: Diagnosis not present

## 2016-11-30 DIAGNOSIS — D61818 Other pancytopenia: Secondary | ICD-10-CM | POA: Diagnosis not present

## 2016-11-30 DIAGNOSIS — D649 Anemia, unspecified: Secondary | ICD-10-CM | POA: Diagnosis not present

## 2016-11-30 DIAGNOSIS — E872 Acidosis: Secondary | ICD-10-CM | POA: Diagnosis not present

## 2016-11-30 DIAGNOSIS — J155 Pneumonia due to Escherichia coli: Secondary | ICD-10-CM | POA: Diagnosis not present

## 2016-11-30 DIAGNOSIS — R918 Other nonspecific abnormal finding of lung field: Secondary | ICD-10-CM | POA: Diagnosis not present

## 2016-11-30 DIAGNOSIS — I83019 Varicose veins of right lower extremity with ulcer of unspecified site: Secondary | ICD-10-CM | POA: Diagnosis present

## 2016-11-30 DIAGNOSIS — I444 Left anterior fascicular block: Secondary | ICD-10-CM | POA: Diagnosis not present

## 2016-11-30 DIAGNOSIS — L97311 Non-pressure chronic ulcer of right ankle limited to breakdown of skin: Secondary | ICD-10-CM | POA: Diagnosis not present

## 2016-11-30 DIAGNOSIS — J188 Other pneumonia, unspecified organism: Secondary | ICD-10-CM | POA: Diagnosis not present

## 2016-11-30 DIAGNOSIS — I517 Cardiomegaly: Secondary | ICD-10-CM | POA: Diagnosis not present

## 2016-11-30 DIAGNOSIS — E11622 Type 2 diabetes mellitus with other skin ulcer: Secondary | ICD-10-CM | POA: Diagnosis present

## 2016-11-30 DIAGNOSIS — D709 Neutropenia, unspecified: Secondary | ICD-10-CM | POA: Diagnosis not present

## 2016-11-30 DIAGNOSIS — J189 Pneumonia, unspecified organism: Secondary | ICD-10-CM | POA: Diagnosis not present

## 2016-11-30 DIAGNOSIS — E11649 Type 2 diabetes mellitus with hypoglycemia without coma: Secondary | ICD-10-CM | POA: Diagnosis not present

## 2016-11-30 DIAGNOSIS — L97319 Non-pressure chronic ulcer of right ankle with unspecified severity: Secondary | ICD-10-CM | POA: Diagnosis not present

## 2016-11-30 DIAGNOSIS — I358 Other nonrheumatic aortic valve disorders: Secondary | ICD-10-CM | POA: Diagnosis not present

## 2016-11-30 DIAGNOSIS — K219 Gastro-esophageal reflux disease without esophagitis: Secondary | ICD-10-CM | POA: Diagnosis present

## 2016-11-30 DIAGNOSIS — E876 Hypokalemia: Secondary | ICD-10-CM | POA: Diagnosis not present

## 2016-11-30 DIAGNOSIS — J168 Pneumonia due to other specified infectious organisms: Secondary | ICD-10-CM | POA: Diagnosis present

## 2016-11-30 DIAGNOSIS — I493 Ventricular premature depolarization: Secondary | ICD-10-CM | POA: Diagnosis not present

## 2016-11-30 DIAGNOSIS — Z5111 Encounter for antineoplastic chemotherapy: Secondary | ICD-10-CM | POA: Diagnosis not present

## 2016-11-30 DIAGNOSIS — C9200 Acute myeloblastic leukemia, not having achieved remission: Secondary | ICD-10-CM | POA: Diagnosis not present

## 2016-11-30 DIAGNOSIS — Z9221 Personal history of antineoplastic chemotherapy: Secondary | ICD-10-CM | POA: Diagnosis not present

## 2016-11-30 DIAGNOSIS — Z87891 Personal history of nicotine dependence: Secondary | ICD-10-CM | POA: Diagnosis not present

## 2016-11-30 DIAGNOSIS — C9202 Acute myeloblastic leukemia, in relapse: Secondary | ICD-10-CM | POA: Diagnosis not present

## 2016-11-30 DIAGNOSIS — F4310 Post-traumatic stress disorder, unspecified: Secondary | ICD-10-CM | POA: Diagnosis present

## 2016-11-30 DIAGNOSIS — I10 Essential (primary) hypertension: Secondary | ICD-10-CM | POA: Diagnosis present

## 2016-11-30 DIAGNOSIS — J9601 Acute respiratory failure with hypoxia: Secondary | ICD-10-CM | POA: Diagnosis not present

## 2016-11-30 DIAGNOSIS — Y95 Nosocomial condition: Secondary | ICD-10-CM | POA: Diagnosis not present

## 2016-11-30 DIAGNOSIS — D6959 Other secondary thrombocytopenia: Secondary | ICD-10-CM | POA: Diagnosis present

## 2016-11-30 DIAGNOSIS — R7881 Bacteremia: Secondary | ICD-10-CM | POA: Diagnosis not present

## 2016-11-30 DIAGNOSIS — J9602 Acute respiratory failure with hypercapnia: Secondary | ICD-10-CM | POA: Diagnosis not present

## 2016-11-30 DIAGNOSIS — A4151 Sepsis due to Escherichia coli [E. coli]: Secondary | ICD-10-CM | POA: Diagnosis not present

## 2016-11-30 DIAGNOSIS — B49 Unspecified mycosis: Secondary | ICD-10-CM | POA: Diagnosis present

## 2016-11-30 DIAGNOSIS — C888 Other malignant immunoproliferative diseases: Secondary | ICD-10-CM | POA: Diagnosis not present

## 2016-11-30 DIAGNOSIS — D696 Thrombocytopenia, unspecified: Secondary | ICD-10-CM | POA: Diagnosis not present

## 2016-11-30 DIAGNOSIS — E871 Hypo-osmolality and hyponatremia: Secondary | ICD-10-CM | POA: Diagnosis not present

## 2016-11-30 DIAGNOSIS — I491 Atrial premature depolarization: Secondary | ICD-10-CM | POA: Diagnosis not present

## 2016-11-30 DIAGNOSIS — R9431 Abnormal electrocardiogram [ECG] [EKG]: Secondary | ICD-10-CM | POA: Diagnosis not present

## 2016-11-30 DIAGNOSIS — R000 Tachycardia, unspecified: Secondary | ICD-10-CM | POA: Diagnosis not present

## 2016-11-30 DIAGNOSIS — I4589 Other specified conduction disorders: Secondary | ICD-10-CM | POA: Diagnosis not present

## 2016-11-30 DIAGNOSIS — S2232XA Fracture of one rib, left side, initial encounter for closed fracture: Secondary | ICD-10-CM | POA: Diagnosis not present

## 2016-11-30 DIAGNOSIS — R6520 Severe sepsis without septic shock: Secondary | ICD-10-CM | POA: Diagnosis not present

## 2016-12-23 DIAGNOSIS — I5042 Chronic combined systolic (congestive) and diastolic (congestive) heart failure: Secondary | ICD-10-CM | POA: Diagnosis not present

## 2016-12-23 DIAGNOSIS — Z006 Encounter for examination for normal comparison and control in clinical research program: Secondary | ICD-10-CM | POA: Diagnosis not present

## 2016-12-23 DIAGNOSIS — Z79899 Other long term (current) drug therapy: Secondary | ICD-10-CM | POA: Diagnosis not present

## 2016-12-23 DIAGNOSIS — E785 Hyperlipidemia, unspecified: Secondary | ICD-10-CM | POA: Diagnosis not present

## 2016-12-23 DIAGNOSIS — K5900 Constipation, unspecified: Secondary | ICD-10-CM | POA: Diagnosis not present

## 2016-12-23 DIAGNOSIS — R791 Abnormal coagulation profile: Secondary | ICD-10-CM | POA: Diagnosis not present

## 2016-12-23 DIAGNOSIS — E119 Type 2 diabetes mellitus without complications: Secondary | ICD-10-CM | POA: Diagnosis not present

## 2016-12-23 DIAGNOSIS — Z9221 Personal history of antineoplastic chemotherapy: Secondary | ICD-10-CM | POA: Diagnosis not present

## 2016-12-23 DIAGNOSIS — R21 Rash and other nonspecific skin eruption: Secondary | ICD-10-CM | POA: Diagnosis not present

## 2016-12-23 DIAGNOSIS — F4310 Post-traumatic stress disorder, unspecified: Secondary | ICD-10-CM | POA: Diagnosis not present

## 2016-12-23 DIAGNOSIS — C9202 Acute myeloblastic leukemia, in relapse: Secondary | ICD-10-CM | POA: Diagnosis not present

## 2016-12-23 DIAGNOSIS — Z7984 Long term (current) use of oral hypoglycemic drugs: Secondary | ICD-10-CM | POA: Diagnosis not present

## 2016-12-23 DIAGNOSIS — I110 Hypertensive heart disease with heart failure: Secondary | ICD-10-CM | POA: Diagnosis not present

## 2016-12-23 DIAGNOSIS — G4700 Insomnia, unspecified: Secondary | ICD-10-CM | POA: Diagnosis not present

## 2016-12-28 DIAGNOSIS — Z7982 Long term (current) use of aspirin: Secondary | ICD-10-CM | POA: Diagnosis not present

## 2016-12-28 DIAGNOSIS — Z7984 Long term (current) use of oral hypoglycemic drugs: Secondary | ICD-10-CM | POA: Diagnosis not present

## 2016-12-28 DIAGNOSIS — E119 Type 2 diabetes mellitus without complications: Secondary | ICD-10-CM | POA: Diagnosis not present

## 2016-12-28 DIAGNOSIS — C9202 Acute myeloblastic leukemia, in relapse: Secondary | ICD-10-CM | POA: Diagnosis not present

## 2016-12-28 DIAGNOSIS — R531 Weakness: Secondary | ICD-10-CM | POA: Diagnosis not present

## 2016-12-28 DIAGNOSIS — K5900 Constipation, unspecified: Secondary | ICD-10-CM | POA: Diagnosis not present

## 2016-12-28 DIAGNOSIS — D696 Thrombocytopenia, unspecified: Secondary | ICD-10-CM | POA: Diagnosis not present

## 2016-12-28 DIAGNOSIS — Z8739 Personal history of other diseases of the musculoskeletal system and connective tissue: Secondary | ICD-10-CM | POA: Diagnosis not present

## 2016-12-28 DIAGNOSIS — E876 Hypokalemia: Secondary | ICD-10-CM | POA: Diagnosis not present

## 2016-12-28 DIAGNOSIS — Z79899 Other long term (current) drug therapy: Secondary | ICD-10-CM | POA: Diagnosis not present

## 2016-12-28 DIAGNOSIS — F4310 Post-traumatic stress disorder, unspecified: Secondary | ICD-10-CM | POA: Diagnosis not present

## 2016-12-28 DIAGNOSIS — I4891 Unspecified atrial fibrillation: Secondary | ICD-10-CM | POA: Diagnosis not present

## 2016-12-28 DIAGNOSIS — I5042 Chronic combined systolic (congestive) and diastolic (congestive) heart failure: Secondary | ICD-10-CM | POA: Diagnosis not present

## 2016-12-28 DIAGNOSIS — E785 Hyperlipidemia, unspecified: Secondary | ICD-10-CM | POA: Diagnosis not present

## 2016-12-28 DIAGNOSIS — G4700 Insomnia, unspecified: Secondary | ICD-10-CM | POA: Diagnosis not present

## 2016-12-28 DIAGNOSIS — I110 Hypertensive heart disease with heart failure: Secondary | ICD-10-CM | POA: Diagnosis not present

## 2016-12-28 DIAGNOSIS — B9620 Unspecified Escherichia coli [E. coli] as the cause of diseases classified elsewhere: Secondary | ICD-10-CM | POA: Diagnosis not present

## 2016-12-28 DIAGNOSIS — R21 Rash and other nonspecific skin eruption: Secondary | ICD-10-CM | POA: Diagnosis not present

## 2016-12-31 DIAGNOSIS — I5040 Unspecified combined systolic (congestive) and diastolic (congestive) heart failure: Secondary | ICD-10-CM | POA: Diagnosis not present

## 2016-12-31 DIAGNOSIS — R21 Rash and other nonspecific skin eruption: Secondary | ICD-10-CM | POA: Diagnosis not present

## 2016-12-31 DIAGNOSIS — I10 Essential (primary) hypertension: Secondary | ICD-10-CM | POA: Diagnosis not present

## 2016-12-31 DIAGNOSIS — K0889 Other specified disorders of teeth and supporting structures: Secondary | ICD-10-CM | POA: Diagnosis not present

## 2016-12-31 DIAGNOSIS — E119 Type 2 diabetes mellitus without complications: Secondary | ICD-10-CM | POA: Diagnosis not present

## 2016-12-31 DIAGNOSIS — C9200 Acute myeloblastic leukemia, not having achieved remission: Secondary | ICD-10-CM | POA: Diagnosis not present

## 2016-12-31 DIAGNOSIS — Z79899 Other long term (current) drug therapy: Secondary | ICD-10-CM | POA: Diagnosis not present

## 2016-12-31 DIAGNOSIS — Z006 Encounter for examination for normal comparison and control in clinical research program: Secondary | ICD-10-CM | POA: Diagnosis not present

## 2016-12-31 DIAGNOSIS — R2689 Other abnormalities of gait and mobility: Secondary | ICD-10-CM | POA: Diagnosis not present

## 2016-12-31 DIAGNOSIS — I959 Hypotension, unspecified: Secondary | ICD-10-CM | POA: Diagnosis not present

## 2016-12-31 DIAGNOSIS — Z7982 Long term (current) use of aspirin: Secondary | ICD-10-CM | POA: Diagnosis not present

## 2016-12-31 DIAGNOSIS — E785 Hyperlipidemia, unspecified: Secondary | ICD-10-CM | POA: Diagnosis not present

## 2016-12-31 DIAGNOSIS — C9202 Acute myeloblastic leukemia, in relapse: Secondary | ICD-10-CM | POA: Diagnosis not present

## 2016-12-31 DIAGNOSIS — F4310 Post-traumatic stress disorder, unspecified: Secondary | ICD-10-CM | POA: Diagnosis not present

## 2016-12-31 DIAGNOSIS — D709 Neutropenia, unspecified: Secondary | ICD-10-CM | POA: Diagnosis not present

## 2016-12-31 DIAGNOSIS — E559 Vitamin D deficiency, unspecified: Secondary | ICD-10-CM | POA: Diagnosis not present

## 2016-12-31 DIAGNOSIS — E876 Hypokalemia: Secondary | ICD-10-CM | POA: Diagnosis not present

## 2017-01-04 DIAGNOSIS — C9202 Acute myeloblastic leukemia, in relapse: Secondary | ICD-10-CM | POA: Diagnosis not present

## 2017-01-07 DIAGNOSIS — I959 Hypotension, unspecified: Secondary | ICD-10-CM | POA: Diagnosis not present

## 2017-01-07 DIAGNOSIS — Z79899 Other long term (current) drug therapy: Secondary | ICD-10-CM | POA: Diagnosis not present

## 2017-01-07 DIAGNOSIS — Z7982 Long term (current) use of aspirin: Secondary | ICD-10-CM | POA: Diagnosis not present

## 2017-01-07 DIAGNOSIS — E785 Hyperlipidemia, unspecified: Secondary | ICD-10-CM | POA: Diagnosis not present

## 2017-01-07 DIAGNOSIS — F4310 Post-traumatic stress disorder, unspecified: Secondary | ICD-10-CM | POA: Diagnosis not present

## 2017-01-07 DIAGNOSIS — I5042 Chronic combined systolic (congestive) and diastolic (congestive) heart failure: Secondary | ICD-10-CM | POA: Diagnosis not present

## 2017-01-07 DIAGNOSIS — E119 Type 2 diabetes mellitus without complications: Secondary | ICD-10-CM | POA: Diagnosis not present

## 2017-01-07 DIAGNOSIS — I10 Essential (primary) hypertension: Secondary | ICD-10-CM | POA: Diagnosis not present

## 2017-01-07 DIAGNOSIS — C9202 Acute myeloblastic leukemia, in relapse: Secondary | ICD-10-CM | POA: Diagnosis not present

## 2017-01-07 DIAGNOSIS — M778 Other enthesopathies, not elsewhere classified: Secondary | ICD-10-CM | POA: Diagnosis not present

## 2017-01-07 DIAGNOSIS — Z7984 Long term (current) use of oral hypoglycemic drugs: Secondary | ICD-10-CM | POA: Diagnosis not present

## 2017-01-11 DIAGNOSIS — Z7984 Long term (current) use of oral hypoglycemic drugs: Secondary | ICD-10-CM | POA: Diagnosis not present

## 2017-01-11 DIAGNOSIS — I10 Essential (primary) hypertension: Secondary | ICD-10-CM | POA: Diagnosis not present

## 2017-01-11 DIAGNOSIS — I959 Hypotension, unspecified: Secondary | ICD-10-CM | POA: Diagnosis not present

## 2017-01-11 DIAGNOSIS — F4310 Post-traumatic stress disorder, unspecified: Secondary | ICD-10-CM | POA: Diagnosis not present

## 2017-01-11 DIAGNOSIS — R634 Abnormal weight loss: Secondary | ICD-10-CM | POA: Diagnosis not present

## 2017-01-11 DIAGNOSIS — Z7982 Long term (current) use of aspirin: Secondary | ICD-10-CM | POA: Diagnosis not present

## 2017-01-11 DIAGNOSIS — D709 Neutropenia, unspecified: Secondary | ICD-10-CM | POA: Diagnosis not present

## 2017-01-11 DIAGNOSIS — E876 Hypokalemia: Secondary | ICD-10-CM | POA: Diagnosis not present

## 2017-01-11 DIAGNOSIS — C9202 Acute myeloblastic leukemia, in relapse: Secondary | ICD-10-CM | POA: Diagnosis not present

## 2017-01-11 DIAGNOSIS — M778 Other enthesopathies, not elsewhere classified: Secondary | ICD-10-CM | POA: Diagnosis not present

## 2017-01-11 DIAGNOSIS — Z79899 Other long term (current) drug therapy: Secondary | ICD-10-CM | POA: Diagnosis not present

## 2017-01-11 DIAGNOSIS — R21 Rash and other nonspecific skin eruption: Secondary | ICD-10-CM | POA: Diagnosis not present

## 2017-01-11 DIAGNOSIS — E119 Type 2 diabetes mellitus without complications: Secondary | ICD-10-CM | POA: Diagnosis not present

## 2017-01-14 DIAGNOSIS — C9202 Acute myeloblastic leukemia, in relapse: Secondary | ICD-10-CM | POA: Diagnosis not present

## 2017-01-14 DIAGNOSIS — Z006 Encounter for examination for normal comparison and control in clinical research program: Secondary | ICD-10-CM | POA: Diagnosis not present

## 2017-01-14 DIAGNOSIS — I10 Essential (primary) hypertension: Secondary | ICD-10-CM | POA: Diagnosis not present

## 2017-01-14 DIAGNOSIS — F4310 Post-traumatic stress disorder, unspecified: Secondary | ICD-10-CM | POA: Diagnosis not present

## 2017-01-14 DIAGNOSIS — Z7982 Long term (current) use of aspirin: Secondary | ICD-10-CM | POA: Diagnosis not present

## 2017-01-14 DIAGNOSIS — I5040 Unspecified combined systolic (congestive) and diastolic (congestive) heart failure: Secondary | ICD-10-CM | POA: Diagnosis not present

## 2017-01-14 DIAGNOSIS — R21 Rash and other nonspecific skin eruption: Secondary | ICD-10-CM | POA: Diagnosis not present

## 2017-01-14 DIAGNOSIS — Z79899 Other long term (current) drug therapy: Secondary | ICD-10-CM | POA: Diagnosis not present

## 2017-01-14 DIAGNOSIS — G4700 Insomnia, unspecified: Secondary | ICD-10-CM | POA: Diagnosis not present

## 2017-01-14 DIAGNOSIS — E876 Hypokalemia: Secondary | ICD-10-CM | POA: Diagnosis not present

## 2017-01-18 DIAGNOSIS — I4891 Unspecified atrial fibrillation: Secondary | ICD-10-CM | POA: Diagnosis not present

## 2017-01-18 DIAGNOSIS — Z79899 Other long term (current) drug therapy: Secondary | ICD-10-CM | POA: Diagnosis not present

## 2017-01-18 DIAGNOSIS — I5042 Chronic combined systolic (congestive) and diastolic (congestive) heart failure: Secondary | ICD-10-CM | POA: Diagnosis not present

## 2017-01-18 DIAGNOSIS — I110 Hypertensive heart disease with heart failure: Secondary | ICD-10-CM | POA: Diagnosis not present

## 2017-01-18 DIAGNOSIS — E876 Hypokalemia: Secondary | ICD-10-CM | POA: Diagnosis not present

## 2017-01-18 DIAGNOSIS — I959 Hypotension, unspecified: Secondary | ICD-10-CM | POA: Diagnosis not present

## 2017-01-18 DIAGNOSIS — Z7982 Long term (current) use of aspirin: Secondary | ICD-10-CM | POA: Diagnosis not present

## 2017-01-18 DIAGNOSIS — E119 Type 2 diabetes mellitus without complications: Secondary | ICD-10-CM | POA: Diagnosis not present

## 2017-01-18 DIAGNOSIS — F4310 Post-traumatic stress disorder, unspecified: Secondary | ICD-10-CM | POA: Diagnosis not present

## 2017-01-18 DIAGNOSIS — Z7984 Long term (current) use of oral hypoglycemic drugs: Secondary | ICD-10-CM | POA: Diagnosis not present

## 2017-01-18 DIAGNOSIS — C9202 Acute myeloblastic leukemia, in relapse: Secondary | ICD-10-CM | POA: Diagnosis not present

## 2017-01-18 DIAGNOSIS — R110 Nausea: Secondary | ICD-10-CM | POA: Diagnosis not present

## 2017-01-18 DIAGNOSIS — E559 Vitamin D deficiency, unspecified: Secondary | ICD-10-CM | POA: Diagnosis not present

## 2017-01-18 DIAGNOSIS — G4700 Insomnia, unspecified: Secondary | ICD-10-CM | POA: Diagnosis not present

## 2017-01-18 DIAGNOSIS — K5900 Constipation, unspecified: Secondary | ICD-10-CM | POA: Diagnosis not present

## 2017-01-18 DIAGNOSIS — E785 Hyperlipidemia, unspecified: Secondary | ICD-10-CM | POA: Diagnosis not present

## 2017-01-18 DIAGNOSIS — R21 Rash and other nonspecific skin eruption: Secondary | ICD-10-CM | POA: Diagnosis not present

## 2017-01-21 DIAGNOSIS — C9202 Acute myeloblastic leukemia, in relapse: Secondary | ICD-10-CM | POA: Diagnosis not present

## 2017-01-21 DIAGNOSIS — E119 Type 2 diabetes mellitus without complications: Secondary | ICD-10-CM | POA: Diagnosis not present

## 2017-01-21 DIAGNOSIS — I4891 Unspecified atrial fibrillation: Secondary | ICD-10-CM | POA: Diagnosis not present

## 2017-01-21 DIAGNOSIS — E785 Hyperlipidemia, unspecified: Secondary | ICD-10-CM | POA: Diagnosis not present

## 2017-01-21 DIAGNOSIS — Z7982 Long term (current) use of aspirin: Secondary | ICD-10-CM | POA: Diagnosis not present

## 2017-01-21 DIAGNOSIS — Z95828 Presence of other vascular implants and grafts: Secondary | ICD-10-CM | POA: Diagnosis not present

## 2017-01-21 DIAGNOSIS — E876 Hypokalemia: Secondary | ICD-10-CM | POA: Diagnosis not present

## 2017-01-21 DIAGNOSIS — E559 Vitamin D deficiency, unspecified: Secondary | ICD-10-CM | POA: Diagnosis not present

## 2017-01-21 DIAGNOSIS — I10 Essential (primary) hypertension: Secondary | ICD-10-CM | POA: Diagnosis not present

## 2017-01-25 DIAGNOSIS — Z7982 Long term (current) use of aspirin: Secondary | ICD-10-CM | POA: Diagnosis not present

## 2017-01-25 DIAGNOSIS — F4310 Post-traumatic stress disorder, unspecified: Secondary | ICD-10-CM | POA: Diagnosis not present

## 2017-01-25 DIAGNOSIS — C9200 Acute myeloblastic leukemia, not having achieved remission: Secondary | ICD-10-CM | POA: Diagnosis not present

## 2017-01-25 DIAGNOSIS — M109 Gout, unspecified: Secondary | ICD-10-CM | POA: Diagnosis not present

## 2017-01-25 DIAGNOSIS — Z79899 Other long term (current) drug therapy: Secondary | ICD-10-CM | POA: Diagnosis not present

## 2017-01-25 DIAGNOSIS — E119 Type 2 diabetes mellitus without complications: Secondary | ICD-10-CM | POA: Diagnosis not present

## 2017-01-25 DIAGNOSIS — E785 Hyperlipidemia, unspecified: Secondary | ICD-10-CM | POA: Diagnosis not present

## 2017-01-25 DIAGNOSIS — R21 Rash and other nonspecific skin eruption: Secondary | ICD-10-CM | POA: Diagnosis not present

## 2017-01-25 DIAGNOSIS — Z006 Encounter for examination for normal comparison and control in clinical research program: Secondary | ICD-10-CM | POA: Diagnosis not present

## 2017-01-25 DIAGNOSIS — I5042 Chronic combined systolic (congestive) and diastolic (congestive) heart failure: Secondary | ICD-10-CM | POA: Diagnosis not present

## 2017-01-25 DIAGNOSIS — I4891 Unspecified atrial fibrillation: Secondary | ICD-10-CM | POA: Diagnosis not present

## 2017-01-25 DIAGNOSIS — I110 Hypertensive heart disease with heart failure: Secondary | ICD-10-CM | POA: Diagnosis not present

## 2017-01-25 DIAGNOSIS — Z7984 Long term (current) use of oral hypoglycemic drugs: Secondary | ICD-10-CM | POA: Diagnosis not present

## 2017-01-25 DIAGNOSIS — C9202 Acute myeloblastic leukemia, in relapse: Secondary | ICD-10-CM | POA: Diagnosis not present

## 2017-01-25 DIAGNOSIS — M7751 Other enthesopathy of right foot: Secondary | ICD-10-CM | POA: Diagnosis not present

## 2017-01-25 DIAGNOSIS — E876 Hypokalemia: Secondary | ICD-10-CM | POA: Diagnosis not present

## 2017-01-25 DIAGNOSIS — Z5111 Encounter for antineoplastic chemotherapy: Secondary | ICD-10-CM | POA: Diagnosis not present

## 2017-01-28 DIAGNOSIS — R110 Nausea: Secondary | ICD-10-CM | POA: Diagnosis not present

## 2017-01-28 DIAGNOSIS — F4310 Post-traumatic stress disorder, unspecified: Secondary | ICD-10-CM | POA: Diagnosis not present

## 2017-01-28 DIAGNOSIS — E119 Type 2 diabetes mellitus without complications: Secondary | ICD-10-CM | POA: Diagnosis not present

## 2017-01-28 DIAGNOSIS — N390 Urinary tract infection, site not specified: Secondary | ICD-10-CM | POA: Diagnosis not present

## 2017-01-28 DIAGNOSIS — Z7984 Long term (current) use of oral hypoglycemic drugs: Secondary | ICD-10-CM | POA: Diagnosis not present

## 2017-01-28 DIAGNOSIS — I110 Hypertensive heart disease with heart failure: Secondary | ICD-10-CM | POA: Diagnosis not present

## 2017-01-28 DIAGNOSIS — I4891 Unspecified atrial fibrillation: Secondary | ICD-10-CM | POA: Diagnosis not present

## 2017-01-28 DIAGNOSIS — E876 Hypokalemia: Secondary | ICD-10-CM | POA: Diagnosis not present

## 2017-01-28 DIAGNOSIS — Z006 Encounter for examination for normal comparison and control in clinical research program: Secondary | ICD-10-CM | POA: Diagnosis not present

## 2017-01-28 DIAGNOSIS — B9620 Unspecified Escherichia coli [E. coli] as the cause of diseases classified elsewhere: Secondary | ICD-10-CM | POA: Diagnosis not present

## 2017-01-28 DIAGNOSIS — Z79899 Other long term (current) drug therapy: Secondary | ICD-10-CM | POA: Diagnosis not present

## 2017-01-28 DIAGNOSIS — I5040 Unspecified combined systolic (congestive) and diastolic (congestive) heart failure: Secondary | ICD-10-CM | POA: Diagnosis not present

## 2017-01-28 DIAGNOSIS — G4700 Insomnia, unspecified: Secondary | ICD-10-CM | POA: Diagnosis not present

## 2017-01-28 DIAGNOSIS — Z1612 Extended spectrum beta lactamase (ESBL) resistance: Secondary | ICD-10-CM | POA: Diagnosis not present

## 2017-01-28 DIAGNOSIS — E785 Hyperlipidemia, unspecified: Secondary | ICD-10-CM | POA: Diagnosis not present

## 2017-01-28 DIAGNOSIS — C9202 Acute myeloblastic leukemia, in relapse: Secondary | ICD-10-CM | POA: Diagnosis not present

## 2017-01-31 DIAGNOSIS — C9202 Acute myeloblastic leukemia, in relapse: Secondary | ICD-10-CM | POA: Diagnosis not present

## 2017-01-31 DIAGNOSIS — R918 Other nonspecific abnormal finding of lung field: Secondary | ICD-10-CM | POA: Diagnosis not present

## 2017-01-31 DIAGNOSIS — B49 Unspecified mycosis: Secondary | ICD-10-CM | POA: Diagnosis not present

## 2017-02-01 DIAGNOSIS — H2513 Age-related nuclear cataract, bilateral: Secondary | ICD-10-CM | POA: Diagnosis not present

## 2017-02-01 DIAGNOSIS — I493 Ventricular premature depolarization: Secondary | ICD-10-CM | POA: Diagnosis not present

## 2017-02-01 DIAGNOSIS — L89312 Pressure ulcer of right buttock, stage 2: Secondary | ICD-10-CM | POA: Diagnosis not present

## 2017-02-01 DIAGNOSIS — K219 Gastro-esophageal reflux disease without esophagitis: Secondary | ICD-10-CM | POA: Diagnosis present

## 2017-02-01 DIAGNOSIS — J189 Pneumonia, unspecified organism: Secondary | ICD-10-CM | POA: Diagnosis not present

## 2017-02-01 DIAGNOSIS — J9601 Acute respiratory failure with hypoxia: Secondary | ICD-10-CM | POA: Diagnosis not present

## 2017-02-01 DIAGNOSIS — H35033 Hypertensive retinopathy, bilateral: Secondary | ICD-10-CM | POA: Diagnosis present

## 2017-02-01 DIAGNOSIS — H409 Unspecified glaucoma: Secondary | ICD-10-CM | POA: Diagnosis present

## 2017-02-01 DIAGNOSIS — R918 Other nonspecific abnormal finding of lung field: Secondary | ICD-10-CM | POA: Diagnosis not present

## 2017-02-01 DIAGNOSIS — L98491 Non-pressure chronic ulcer of skin of other sites limited to breakdown of skin: Secondary | ICD-10-CM | POA: Diagnosis not present

## 2017-02-01 DIAGNOSIS — G9340 Encephalopathy, unspecified: Secondary | ICD-10-CM | POA: Diagnosis not present

## 2017-02-01 DIAGNOSIS — J9691 Respiratory failure, unspecified with hypoxia: Secondary | ICD-10-CM | POA: Diagnosis not present

## 2017-02-01 DIAGNOSIS — Z9989 Dependence on other enabling machines and devices: Secondary | ICD-10-CM | POA: Diagnosis not present

## 2017-02-01 DIAGNOSIS — R4182 Altered mental status, unspecified: Secondary | ICD-10-CM | POA: Diagnosis not present

## 2017-02-01 DIAGNOSIS — E11622 Type 2 diabetes mellitus with other skin ulcer: Secondary | ICD-10-CM | POA: Diagnosis not present

## 2017-02-01 DIAGNOSIS — J17 Pneumonia in diseases classified elsewhere: Secondary | ICD-10-CM | POA: Diagnosis present

## 2017-02-01 DIAGNOSIS — D649 Anemia, unspecified: Secondary | ICD-10-CM | POA: Diagnosis not present

## 2017-02-01 DIAGNOSIS — H43399 Other vitreous opacities, unspecified eye: Secondary | ICD-10-CM | POA: Diagnosis present

## 2017-02-01 DIAGNOSIS — E876 Hypokalemia: Secondary | ICD-10-CM | POA: Diagnosis not present

## 2017-02-01 DIAGNOSIS — D709 Neutropenia, unspecified: Secondary | ICD-10-CM | POA: Diagnosis present

## 2017-02-01 DIAGNOSIS — R410 Disorientation, unspecified: Secondary | ICD-10-CM | POA: Diagnosis not present

## 2017-02-01 DIAGNOSIS — D696 Thrombocytopenia, unspecified: Secondary | ICD-10-CM | POA: Diagnosis not present

## 2017-02-01 DIAGNOSIS — A4189 Other specified sepsis: Secondary | ICD-10-CM | POA: Diagnosis not present

## 2017-02-01 DIAGNOSIS — L89322 Pressure ulcer of left buttock, stage 2: Secondary | ICD-10-CM | POA: Diagnosis not present

## 2017-02-01 DIAGNOSIS — D899 Disorder involving the immune mechanism, unspecified: Secondary | ICD-10-CM | POA: Diagnosis present

## 2017-02-01 DIAGNOSIS — G893 Neoplasm related pain (acute) (chronic): Secondary | ICD-10-CM | POA: Diagnosis not present

## 2017-02-01 DIAGNOSIS — F339 Major depressive disorder, recurrent, unspecified: Secondary | ICD-10-CM | POA: Diagnosis not present

## 2017-02-01 DIAGNOSIS — I10 Essential (primary) hypertension: Secondary | ICD-10-CM | POA: Diagnosis present

## 2017-02-01 DIAGNOSIS — R9431 Abnormal electrocardiogram [ECG] [EKG]: Secondary | ICD-10-CM | POA: Diagnosis not present

## 2017-02-01 DIAGNOSIS — Z66 Do not resuscitate: Secondary | ICD-10-CM | POA: Diagnosis present

## 2017-02-01 DIAGNOSIS — E118 Type 2 diabetes mellitus with unspecified complications: Secondary | ICD-10-CM | POA: Diagnosis not present

## 2017-02-01 DIAGNOSIS — A419 Sepsis, unspecified organism: Secondary | ICD-10-CM | POA: Diagnosis not present

## 2017-02-01 DIAGNOSIS — H43393 Other vitreous opacities, bilateral: Secondary | ICD-10-CM | POA: Diagnosis not present

## 2017-02-01 DIAGNOSIS — G8929 Other chronic pain: Secondary | ICD-10-CM | POA: Diagnosis present

## 2017-02-01 DIAGNOSIS — J9859 Other diseases of mediastinum, not elsewhere classified: Secondary | ICD-10-CM | POA: Diagnosis not present

## 2017-02-01 DIAGNOSIS — C9200 Acute myeloblastic leukemia, not having achieved remission: Secondary | ICD-10-CM | POA: Diagnosis not present

## 2017-02-01 DIAGNOSIS — L89893 Pressure ulcer of other site, stage 3: Secondary | ICD-10-CM | POA: Diagnosis not present

## 2017-02-01 DIAGNOSIS — R05 Cough: Secondary | ICD-10-CM | POA: Diagnosis not present

## 2017-02-01 DIAGNOSIS — J90 Pleural effusion, not elsewhere classified: Secondary | ICD-10-CM | POA: Diagnosis not present

## 2017-02-01 DIAGNOSIS — R000 Tachycardia, unspecified: Secondary | ICD-10-CM | POA: Diagnosis not present

## 2017-02-01 DIAGNOSIS — D708 Other neutropenia: Secondary | ICD-10-CM | POA: Diagnosis not present

## 2017-02-01 DIAGNOSIS — J168 Pneumonia due to other specified infectious organisms: Secondary | ICD-10-CM | POA: Diagnosis not present

## 2017-02-01 DIAGNOSIS — E119 Type 2 diabetes mellitus without complications: Secondary | ICD-10-CM | POA: Diagnosis not present

## 2017-02-01 DIAGNOSIS — L97311 Non-pressure chronic ulcer of right ankle limited to breakdown of skin: Secondary | ICD-10-CM | POA: Diagnosis present

## 2017-02-01 DIAGNOSIS — Z5111 Encounter for antineoplastic chemotherapy: Secondary | ICD-10-CM | POA: Diagnosis not present

## 2017-02-01 DIAGNOSIS — R509 Fever, unspecified: Secondary | ICD-10-CM | POA: Diagnosis not present

## 2017-02-01 DIAGNOSIS — I4891 Unspecified atrial fibrillation: Secondary | ICD-10-CM | POA: Diagnosis not present

## 2017-02-01 DIAGNOSIS — B49 Unspecified mycosis: Secondary | ICD-10-CM | POA: Diagnosis present

## 2017-02-01 DIAGNOSIS — R5081 Fever presenting with conditions classified elsewhere: Secondary | ICD-10-CM | POA: Diagnosis present

## 2017-02-01 DIAGNOSIS — E785 Hyperlipidemia, unspecified: Secondary | ICD-10-CM | POA: Diagnosis present

## 2017-02-01 DIAGNOSIS — G4733 Obstructive sleep apnea (adult) (pediatric): Secondary | ICD-10-CM | POA: Diagnosis not present

## 2017-02-01 DIAGNOSIS — J188 Other pneumonia, unspecified organism: Secondary | ICD-10-CM | POA: Diagnosis not present

## 2017-02-01 DIAGNOSIS — G4700 Insomnia, unspecified: Secondary | ICD-10-CM | POA: Diagnosis not present

## 2017-02-01 DIAGNOSIS — D6959 Other secondary thrombocytopenia: Secondary | ICD-10-CM | POA: Diagnosis not present

## 2017-02-01 DIAGNOSIS — C9202 Acute myeloblastic leukemia, in relapse: Secondary | ICD-10-CM | POA: Diagnosis present

## 2017-02-01 DIAGNOSIS — F3289 Other specified depressive episodes: Secondary | ICD-10-CM | POA: Diagnosis not present

## 2017-02-01 DIAGNOSIS — F329 Major depressive disorder, single episode, unspecified: Secondary | ICD-10-CM | POA: Diagnosis present

## 2017-02-18 DIAGNOSIS — Z7984 Long term (current) use of oral hypoglycemic drugs: Secondary | ICD-10-CM | POA: Diagnosis not present

## 2017-02-18 DIAGNOSIS — K5900 Constipation, unspecified: Secondary | ICD-10-CM | POA: Diagnosis not present

## 2017-02-18 DIAGNOSIS — I4891 Unspecified atrial fibrillation: Secondary | ICD-10-CM | POA: Diagnosis not present

## 2017-02-18 DIAGNOSIS — E559 Vitamin D deficiency, unspecified: Secondary | ICD-10-CM | POA: Diagnosis not present

## 2017-02-18 DIAGNOSIS — R609 Edema, unspecified: Secondary | ICD-10-CM | POA: Diagnosis not present

## 2017-02-18 DIAGNOSIS — E785 Hyperlipidemia, unspecified: Secondary | ICD-10-CM | POA: Diagnosis not present

## 2017-02-18 DIAGNOSIS — R2689 Other abnormalities of gait and mobility: Secondary | ICD-10-CM | POA: Diagnosis not present

## 2017-02-18 DIAGNOSIS — Z79899 Other long term (current) drug therapy: Secondary | ICD-10-CM | POA: Diagnosis not present

## 2017-02-18 DIAGNOSIS — R110 Nausea: Secondary | ICD-10-CM | POA: Diagnosis not present

## 2017-02-18 DIAGNOSIS — Z66 Do not resuscitate: Secondary | ICD-10-CM | POA: Diagnosis not present

## 2017-02-18 DIAGNOSIS — R21 Rash and other nonspecific skin eruption: Secondary | ICD-10-CM | POA: Diagnosis not present

## 2017-02-18 DIAGNOSIS — D709 Neutropenia, unspecified: Secondary | ICD-10-CM | POA: Diagnosis not present

## 2017-02-18 DIAGNOSIS — F4310 Post-traumatic stress disorder, unspecified: Secondary | ICD-10-CM | POA: Diagnosis not present

## 2017-02-18 DIAGNOSIS — I110 Hypertensive heart disease with heart failure: Secondary | ICD-10-CM | POA: Diagnosis not present

## 2017-02-18 DIAGNOSIS — Z8739 Personal history of other diseases of the musculoskeletal system and connective tissue: Secondary | ICD-10-CM | POA: Diagnosis not present

## 2017-02-18 DIAGNOSIS — E119 Type 2 diabetes mellitus without complications: Secondary | ICD-10-CM | POA: Diagnosis not present

## 2017-02-18 DIAGNOSIS — I5040 Unspecified combined systolic (congestive) and diastolic (congestive) heart failure: Secondary | ICD-10-CM | POA: Diagnosis not present

## 2017-02-18 DIAGNOSIS — R7989 Other specified abnormal findings of blood chemistry: Secondary | ICD-10-CM | POA: Diagnosis not present

## 2017-02-18 DIAGNOSIS — C9202 Acute myeloblastic leukemia, in relapse: Secondary | ICD-10-CM | POA: Diagnosis not present

## 2017-02-18 DIAGNOSIS — E876 Hypokalemia: Secondary | ICD-10-CM | POA: Diagnosis not present

## 2017-02-18 DIAGNOSIS — G4700 Insomnia, unspecified: Secondary | ICD-10-CM | POA: Diagnosis not present

## 2017-02-20 ENCOUNTER — Emergency Department (HOSPITAL_COMMUNITY): Payer: Medicare Other

## 2017-02-20 ENCOUNTER — Inpatient Hospital Stay (HOSPITAL_COMMUNITY)
Admission: EM | Admit: 2017-02-20 | Discharge: 2017-02-22 | DRG: 871 | Disposition: A | Discharge status: Hospice | Payer: Medicare Other | Attending: Internal Medicine | Admitting: Internal Medicine

## 2017-02-20 DIAGNOSIS — H409 Unspecified glaucoma: Secondary | ICD-10-CM | POA: Diagnosis present

## 2017-02-20 DIAGNOSIS — Z87891 Personal history of nicotine dependence: Secondary | ICD-10-CM | POA: Diagnosis not present

## 2017-02-20 DIAGNOSIS — G4733 Obstructive sleep apnea (adult) (pediatric): Secondary | ICD-10-CM | POA: Diagnosis present

## 2017-02-20 DIAGNOSIS — R6520 Severe sepsis without septic shock: Secondary | ICD-10-CM | POA: Diagnosis not present

## 2017-02-20 DIAGNOSIS — Z515 Encounter for palliative care: Secondary | ICD-10-CM | POA: Diagnosis present

## 2017-02-20 DIAGNOSIS — R0602 Shortness of breath: Secondary | ICD-10-CM | POA: Diagnosis not present

## 2017-02-20 DIAGNOSIS — F4310 Post-traumatic stress disorder, unspecified: Secondary | ICD-10-CM | POA: Diagnosis present

## 2017-02-20 DIAGNOSIS — G4700 Insomnia, unspecified: Secondary | ICD-10-CM | POA: Diagnosis present

## 2017-02-20 DIAGNOSIS — J189 Pneumonia, unspecified organism: Secondary | ICD-10-CM | POA: Diagnosis not present

## 2017-02-20 DIAGNOSIS — M1990 Unspecified osteoarthritis, unspecified site: Secondary | ICD-10-CM | POA: Diagnosis present

## 2017-02-20 DIAGNOSIS — R509 Fever, unspecified: Secondary | ICD-10-CM | POA: Diagnosis not present

## 2017-02-20 DIAGNOSIS — J17 Pneumonia in diseases classified elsewhere: Secondary | ICD-10-CM

## 2017-02-20 DIAGNOSIS — E119 Type 2 diabetes mellitus without complications: Secondary | ICD-10-CM | POA: Diagnosis present

## 2017-02-20 DIAGNOSIS — Z8249 Family history of ischemic heart disease and other diseases of the circulatory system: Secondary | ICD-10-CM

## 2017-02-20 DIAGNOSIS — I444 Left anterior fascicular block: Secondary | ICD-10-CM | POA: Diagnosis present

## 2017-02-20 DIAGNOSIS — Z66 Do not resuscitate: Secondary | ICD-10-CM | POA: Diagnosis present

## 2017-02-20 DIAGNOSIS — J168 Pneumonia due to other specified infectious organisms: Secondary | ICD-10-CM | POA: Diagnosis present

## 2017-02-20 DIAGNOSIS — J9621 Acute and chronic respiratory failure with hypoxia: Secondary | ICD-10-CM | POA: Diagnosis present

## 2017-02-20 DIAGNOSIS — Z9981 Dependence on supplemental oxygen: Secondary | ICD-10-CM

## 2017-02-20 DIAGNOSIS — E785 Hyperlipidemia, unspecified: Secondary | ICD-10-CM | POA: Diagnosis present

## 2017-02-20 DIAGNOSIS — I10 Essential (primary) hypertension: Secondary | ICD-10-CM | POA: Diagnosis present

## 2017-02-20 DIAGNOSIS — Z791 Long term (current) use of non-steroidal anti-inflammatories (NSAID): Secondary | ICD-10-CM

## 2017-02-20 DIAGNOSIS — Y95 Nosocomial condition: Secondary | ICD-10-CM | POA: Diagnosis present

## 2017-02-20 DIAGNOSIS — R05 Cough: Secondary | ICD-10-CM | POA: Diagnosis not present

## 2017-02-20 DIAGNOSIS — Z79899 Other long term (current) drug therapy: Secondary | ICD-10-CM

## 2017-02-20 DIAGNOSIS — C9200 Acute myeloblastic leukemia, not having achieved remission: Secondary | ICD-10-CM | POA: Diagnosis present

## 2017-02-20 DIAGNOSIS — Z96653 Presence of artificial knee joint, bilateral: Secondary | ICD-10-CM | POA: Diagnosis present

## 2017-02-20 DIAGNOSIS — D61818 Other pancytopenia: Secondary | ICD-10-CM | POA: Diagnosis not present

## 2017-02-20 DIAGNOSIS — K219 Gastro-esophageal reflux disease without esophagitis: Secondary | ICD-10-CM | POA: Diagnosis present

## 2017-02-20 DIAGNOSIS — F419 Anxiety disorder, unspecified: Secondary | ICD-10-CM | POA: Diagnosis present

## 2017-02-20 DIAGNOSIS — J9601 Acute respiratory failure with hypoxia: Secondary | ICD-10-CM | POA: Diagnosis present

## 2017-02-20 DIAGNOSIS — Z7401 Bed confinement status: Secondary | ICD-10-CM

## 2017-02-20 DIAGNOSIS — C9250 Acute myelomonocytic leukemia, not having achieved remission: Secondary | ICD-10-CM | POA: Diagnosis not present

## 2017-02-20 DIAGNOSIS — D696 Thrombocytopenia, unspecified: Secondary | ICD-10-CM | POA: Diagnosis not present

## 2017-02-20 DIAGNOSIS — Z881 Allergy status to other antibiotic agents status: Secondary | ICD-10-CM

## 2017-02-20 DIAGNOSIS — B49 Unspecified mycosis: Secondary | ICD-10-CM | POA: Diagnosis present

## 2017-02-20 DIAGNOSIS — R0682 Tachypnea, not elsewhere classified: Secondary | ICD-10-CM | POA: Diagnosis not present

## 2017-02-20 DIAGNOSIS — A419 Sepsis, unspecified organism: Secondary | ICD-10-CM | POA: Diagnosis present

## 2017-02-20 DIAGNOSIS — E43 Unspecified severe protein-calorie malnutrition: Secondary | ICD-10-CM | POA: Diagnosis not present

## 2017-02-20 DIAGNOSIS — Z7984 Long term (current) use of oral hypoglycemic drugs: Secondary | ICD-10-CM

## 2017-02-20 DIAGNOSIS — D649 Anemia, unspecified: Secondary | ICD-10-CM | POA: Diagnosis not present

## 2017-02-20 DIAGNOSIS — Z452 Encounter for adjustment and management of vascular access device: Secondary | ICD-10-CM | POA: Diagnosis not present

## 2017-02-20 DIAGNOSIS — Z436 Encounter for attention to other artificial openings of urinary tract: Secondary | ICD-10-CM | POA: Diagnosis not present

## 2017-02-20 DIAGNOSIS — J9600 Acute respiratory failure, unspecified whether with hypoxia or hypercapnia: Secondary | ICD-10-CM | POA: Diagnosis not present

## 2017-02-20 LAB — COMPREHENSIVE METABOLIC PANEL
ALBUMIN: 2.2 g/dL — AB (ref 3.5–5.0)
ALK PHOS: 79 U/L (ref 38–126)
ALT: 15 U/L — AB (ref 17–63)
AST: 22 U/L (ref 15–41)
Anion gap: 8 (ref 5–15)
BUN: 12 mg/dL (ref 6–20)
CALCIUM: 8.4 mg/dL — AB (ref 8.9–10.3)
CHLORIDE: 103 mmol/L (ref 101–111)
CO2: 26 mmol/L (ref 22–32)
CREATININE: 0.78 mg/dL (ref 0.61–1.24)
GFR calc Af Amer: >60 mL/min (ref >60)
GFR calc non Af Amer: >60 mL/min (ref >60)
GLUCOSE: 168 mg/dL — AB (ref 65–99)
Potassium: 3.4 mmol/L — ABNORMAL LOW (ref 3.5–5.1)
SODIUM: 137 mmol/L (ref 135–145)
Total Bilirubin: 1.3 mg/dL — ABNORMAL HIGH (ref 0.3–1.2)
Total Protein: 6.5 g/dL (ref 6.5–8.1)

## 2017-02-20 LAB — BLOOD GAS, VENOUS
ACID-BASE EXCESS: 2.3 mmol/L — AB (ref 0.0–2.0)
Bicarbonate: 26.5 mmol/L (ref 20.0–28.0)
O2 Saturation: 73.6 %
PCO2 VEN: 41.8 mmHg — AB (ref 44.0–60.0)
PH VEN: 7.418 (ref 7.250–7.430)
Patient temperature: 98.6
pO2, Ven: 41.3 mmHg (ref 32.0–45.0)

## 2017-02-20 LAB — I-STAT CG4 LACTIC ACID, ED: Lactic Acid, Venous: 1 mmol/L (ref 0.5–1.9)

## 2017-02-20 MED ORDER — LORAZEPAM 2 MG/ML IJ SOLN: 1.0000 mg | INTRAMUSCULAR | Status: DC | PRN

## 2017-02-20 MED ORDER — MORPHINE SULFATE (PF) 2 MG/ML IV SOLN
1.0000 mg | INTRAVENOUS | Status: DC | PRN
  Administered 2017-02-20 (×2): 2 mg via INTRAVENOUS
  Administered 2017-02-21: 1 mg via INTRAVENOUS
  Filled 2017-02-20 (×4): qty 1

## 2017-02-20 MED ORDER — CEFEPIME HCL 2 G IJ SOLR
2.0000 g | Freq: Once | INTRAMUSCULAR | Status: AC
  Administered 2017-02-20: 2 g via INTRAVENOUS
  Filled 2017-02-20: qty 2

## 2017-02-20 MED ORDER — ACETAMINOPHEN 650 MG RE SUPP
650.0000 mg | Freq: Four times a day (QID) | RECTAL | Status: DC | PRN
  Administered 2017-02-21 – 2017-02-22 (×3): 650 mg via RECTAL
  Filled 2017-02-20 (×5): qty 1

## 2017-02-20 MED ORDER — HALOPERIDOL LACTATE 2 MG/ML PO CONC
0.5000 mg | ORAL | Status: DC | PRN
  Filled 2017-02-20: qty 0.3

## 2017-02-20 MED ORDER — VANCOMYCIN HCL IN DEXTROSE 1-5 GM/200ML-% IV SOLN: 1000.0000 mg | Freq: Three times a day (TID) | INTRAVENOUS | Status: DC

## 2017-02-20 MED ORDER — ACETAMINOPHEN 650 MG RE SUPP
650.0000 mg | Freq: Once | RECTAL | Status: AC
  Administered 2017-02-20: 650 mg via RECTAL
  Filled 2017-02-20: qty 1

## 2017-02-20 MED ORDER — RESOURCE THICKENUP CLEAR PO POWD
ORAL | Status: DC | PRN
  Filled 2017-02-20: qty 125

## 2017-02-20 MED ORDER — ACETAMINOPHEN 325 MG PO TABS: 650.0000 mg | ORAL_TABLET | Freq: Four times a day (QID) | ORAL | Status: DC | PRN

## 2017-02-20 MED ORDER — GLYCOPYRROLATE 1 MG PO TABS
1.0000 mg | ORAL_TABLET | ORAL | Status: DC | PRN
  Filled 2017-02-20: qty 1

## 2017-02-20 MED ORDER — LORAZEPAM 2 MG/ML PO CONC
1.0000 mg | ORAL | Status: DC | PRN
Start: 2017-02-20 — End: 2017-02-22

## 2017-02-20 MED ORDER — GLYCOPYRROLATE 0.2 MG/ML IJ SOLN
0.2000 mg | INTRAMUSCULAR | Status: DC | PRN
  Filled 2017-02-20 (×2): qty 1

## 2017-02-20 MED ORDER — SODIUM CHLORIDE 0.9 % IV BOLUS (SEPSIS)
1000.0000 mL | Freq: Once | INTRAVENOUS | Status: AC
  Administered 2017-02-20: 1000 mL via INTRAVENOUS

## 2017-02-20 MED ORDER — GLYCOPYRROLATE 0.2 MG/ML IJ SOLN
0.2000 mg | INTRAMUSCULAR | Status: DC | PRN
  Administered 2017-02-21 – 2017-02-22 (×3): 0.2 mg via INTRAVENOUS
  Filled 2017-02-20 (×2): qty 1

## 2017-02-20 MED ORDER — SODIUM CHLORIDE 0.9% FLUSH: 3.0000 mL | Freq: Two times a day (BID) | INTRAVENOUS | Status: DC

## 2017-02-20 MED ORDER — HALOPERIDOL 1 MG PO TABS
0.5000 mg | ORAL_TABLET | ORAL | Status: DC | PRN
  Filled 2017-02-20: qty 0.5

## 2017-02-20 MED ORDER — ALTEPLASE 2 MG IJ SOLR
2.0000 mg | Freq: Once | INTRAMUSCULAR | Status: DC
  Filled 2017-02-20: qty 2

## 2017-02-20 MED ORDER — SODIUM CHLORIDE 0.9% FLUSH: 3.0000 mL | INTRAVENOUS | Status: DC | PRN

## 2017-02-20 MED ORDER — DEXTROSE 5 % IV SOLN
1.0000 g | Freq: Three times a day (TID) | INTRAVENOUS | Status: DC
  Filled 2017-02-20: qty 1

## 2017-02-20 MED ORDER — LORAZEPAM 1 MG PO TABS: 1.0000 mg | ORAL_TABLET | ORAL | Status: DC | PRN

## 2017-02-20 MED ORDER — HALOPERIDOL LACTATE 5 MG/ML IJ SOLN: 0.5000 mg | INTRAMUSCULAR | Status: DC | PRN

## 2017-02-20 MED ORDER — ONDANSETRON 4 MG PO TBDP: 4.0000 mg | ORAL_TABLET | Freq: Four times a day (QID) | ORAL | Status: DC | PRN

## 2017-02-20 MED ORDER — ALBUTEROL SULFATE (2.5 MG/3ML) 0.083% IN NEBU: 2.5000 mg | INHALATION_SOLUTION | RESPIRATORY_TRACT | Status: DC | PRN

## 2017-02-20 MED ORDER — VANCOMYCIN HCL IN DEXTROSE 1-5 GM/200ML-% IV SOLN
1000.0000 mg | Freq: Once | INTRAVENOUS | Status: AC
  Administered 2017-02-20: 1000 mg via INTRAVENOUS
  Filled 2017-02-20: qty 200

## 2017-02-20 MED ORDER — ACETAMINOPHEN 650 MG RE SUPP
650.0000 mg | Freq: Four times a day (QID) | RECTAL | Status: DC | PRN
  Administered 2017-02-20: 650 mg via RECTAL

## 2017-02-20 MED ORDER — SODIUM CHLORIDE 0.9% FLUSH: 10.0000 mL | Freq: Two times a day (BID) | INTRAVENOUS | Status: DC

## 2017-02-20 MED ORDER — ONDANSETRON HCL 4 MG/2ML IJ SOLN: 4.0000 mg | Freq: Four times a day (QID) | INTRAMUSCULAR | Status: DC | PRN

## 2017-02-20 MED ORDER — SODIUM CHLORIDE 0.9% FLUSH: 10.0000 mL | INTRAVENOUS | Status: DC | PRN

## 2017-02-20 MED ORDER — SODIUM CHLORIDE 0.9 % IV SOLN
250.0000 mL | INTRAVENOUS | Status: DC | PRN
  Administered 2017-02-20: 500 mL via INTRAVENOUS
  Administered 2017-02-22: 250 mL via INTRAVENOUS

## 2017-02-20 NOTE — Progress Notes (Signed)
CSW acknowledged consult for residential hospice placement. CSW spoke with patient's attending MD regarding consult for patient, Attending MD requested that CSW wait to see patient/patient's family tomorrow. CSW will continue to follow and assist with discharge planning according to palliative recommendation.   Abundio Miu, [facility]  Clinical Social Worker [phone]

## 2017-02-20 NOTE — H&P (Signed)
[facility] History and Physical   Patient: William Stokes:[id number]   PCP: Colon Branch, MD DOB: 02/13/1946   DOA: 02/20/2017   DOS: 02/20/2017   DOS: the patient was seen and examined on 02/20/2017  Patient coming from: The patient is coming from home.  Chief Complaint: Shortness of breath  HPI: William Stokes is a 71 y.o. male with Past medical history of acute myelomonocytic leukemia, undergoing therapy, fungal pneumonia, chronic respiratory failure, pressure ulcers, A. fib, chronic pain, diabetes, hypertension, depression, GERD. The patient presented to the ER with the complains of shortness of breath. Patient was recently hospitalized at [facility] for sepsis due to worsening of his fungal pneumonia. Patient was aggressively treated with IV antibiotics broad-spectrum as well as to cover for fungal infection. Admitted to ICU and discharged on 02/16/2017. After going home patient was in poor health and continues to have shortness of breath which was progressively worsening. There was also increasing confusion and lethargy. Patient was having minimal oral intake. There was no diarrhea reported no abdominal pain reported. No bleeding reported. No recent change in medications reported. She continues to receive transfusions at outside facility after discharge.  ED Course: Presented with shortness of breath, initially started on code sepsis. Received IV fluid as well as IV antibiotic. Blood cultures were performed. Patient was referred for admission for further workup and treatment.  At his baseline is bed bound And is dependent for most of his ADL; does not manages his medication on his own.  Review of Systems: as mentioned in the history of present illness.  All other systems reviewed and are negative.  Past Medical History:  Diagnosis Date  . Adenomatous colon polyp   . AMML (acute myelomonocytic leukemia) ([place]) 2016  . Anxiety   . Atrial fibrillation with RVR  ([place]) 2016  . Cataracts, bilateral    immature  . Diabetes mellitus 2/09   takes Januvia-started 2wks ago  . Eczema    legs and hands  . Erectile dysfunction   . GERD (gastroesophageal reflux disease)    takes OMeprazole daily  . Glaucoma   . H/O septic shock 2016  . H/O transfusion of packed red blood cells 2016,2017   [facility]  . Hyperlipidemia    was on Crestor but has been off for a couple of months  . Hypertension 3/11   takes Lisinopril daily  . Insomnia   . LAFB (left anterior fascicular block)    per baseline EKG 2009  . Osteoarthritis    left knee  . Primary male hypogonadism   . PTSD (post-traumatic stress disorder)    went to [place], has chronic diff. sleeping  . Respiratory failure with hypoxia ([place]) 2016   Acute  . Sleep apnea   . Sleep apnea, obstructive    uses CPAP and last sleep study in epic from 2011   Past Surgical History:  Procedure Laterality Date  . COLONOSCOPY    . HERNIA REPAIR    . KNEE ARTHROSCOPY  1999   meniscal torn L, Dr.Dalldorf  . QUADRICEPS TENDON REPAIR  while in [place]   left leg  . TOTAL KNEE ARTHROPLASTY  03/28/2012   LEFT TOTAL KNEE ARTHROPLASTY;  Surgeon: Hessie Dibble, MD;  Location: [place];  Service: Orthopedics;  Laterality: Left;  with revision tibia  . TOTAL KNEE ARTHROPLASTY  08/03/2012   Procedure: TOTAL KNEE ARTHROPLASTY;  Surgeon: Hessie Dibble, MD;  Location: [place];  Service: Orthopedics;  Laterality: Right;   Social History:  reports that he quit smoking about 25 years ago. His smoking use included Cigarettes. He has a 44.00 pack-year smoking history. He has never used smokeless tobacco. He reports that he does not drink alcohol or use drugs.  Allergies  Allergen Reactions  . Nitrofurantoin Rash    Family History  Problem Relation Age of Onset  . Heart disease Mother        MI age onset 63s?  . Diabetes Mother   . Prostate cancer Father        dx age 24s  . Colon cancer Neg Hx      Prior to  Admission medications   Medication Sig Start Date End Date Taking? Authorizing Provider  acyclovir (ZOVIRAX) 400 MG tablet Take 400 mg by mouth 2 (two) times daily. 01/14/17  Yes [provider]  allopurinol (ZYLOPRIM) 300 MG tablet Take 300 mg by mouth daily. 01/25/17  Yes [provider]  benzonatate (TESSALON) 100 MG capsule Take 100 mg by mouth 3 (three) times daily.  01/25/17  Yes [provider]  buPROPion (WELLBUTRIN SR) 150 MG 12 hr tablet Take 150 mg by mouth daily.  05/12/15  Yes Colon Branch, MD  diphenhydrAMINE (BENADRYL) 25 mg capsule Take 25 mg by mouth daily as needed (before platelet infusion).  02/16/17  Yes [provider]  dorzolamide-timolol (COSOPT) 22.3-6.8 MG/ML ophthalmic solution Place 1 drop into both eyes 2 (two) times daily. 05/12/15  Yes Paz, Alda Berthold, MD  furosemide (LASIX) 20 MG tablet Take 20 mg by mouth daily as needed for fluid or edema.  03/16/16  Yes [provider]  glucose blood (FREESTYLE LITE) test strip Check blood sugar twice daily. 02/12/15  Yes Paz, Alda Berthold, MD  guaiFENesin-codeine 100-10 MG/5ML syrup Take 5 mLs by mouth every 8 (eight) hours as needed for cough. 01/28/17  Yes [provider]  hydroxyurea (HYDREA) 500 MG capsule Take 500 mg by mouth 2 (two) times daily. 12/20/16  Yes [provider]  Lancets (FREESTYLE) lancets Check once daily. Dx: 250.00 01/17/13  Yes Paz, Alda Berthold, MD  levofloxacin (LEVAQUIN) 500 MG tablet Take 1 tablet (500 mg total) by mouth daily while neutropenic. Your physician will instruct you when to stop taking. 12/20/16  Yes [provider]  lidocaine-prilocaine (EMLA) cream Apply 1 application topically once. 1 hour prior to port access 05/12/15  Yes Colon Branch, MD  metFORMIN (GLUCOPHAGE) 850 MG tablet Take 1 tablet (850 mg total) by mouth daily with breakfast. 05/12/15  Yes Colon Branch, MD  omeprazole (PRILOSEC) 20 MG capsule Take 40 mg by mouth daily. 02/16/17  Yes [provider]  ondansetron (ZOFRAN) 8 MG tablet Take by mouth every 8 (eight) hours as needed for nausea or vomiting. Reported on 10/29/2015 05/12/15  Yes Colon Branch, MD  sertraline (ZOLOFT) 100 MG tablet Take 2 tablets (200 mg total) by mouth daily. Patient taking differently: Take 100 mg by mouth daily.  02/12/15  Yes Colon Branch, MD  Specialty Vitamins Products (MAGNESIUM, AMINO ACID CHELATE,) 133 MG tablet TAKE 2 TABLETS BY MOUTH 2 TIMES DAILY OR AS DIRECTED 02/16/17  Yes [provider]  voriconazole (VFEND) 200 MG tablet Take 200 mg by mouth 2 (two) times daily. 01/14/17  Yes [provider]  voriconazole (VFEND) 50 MG tablet Take 100 mg by mouth 2 (two) times daily. 01/25/17  Yes [provider]  atorvastatin (LIPITOR) 80 MG tablet Take  1 tablet (80 mg total) by mouth daily. Patient not taking: Reported on 02/20/2017 12/15/15   Colon Branch, MD  omeprazole (PRILOSEC) 40 MG capsule Take 1 capsule (40 mg total) by mouth daily. Patient not taking: Reported on 02/20/2017 06/27/13 02/20/17  Colon Branch, MD    Physical Exam: Vitals:   02/20/17 1240 02/20/17 1300 02/20/17 1416 02/20/17 1534  BP:  (!) 121/35  (!) 81/47  Pulse:  (!) 115 (!) 113 (!) 113  Resp:  (!) 27 (!) 28 18  Temp: (!) 101.2 F (38.4 C)   99.8 F (37.7 C)  TempSrc: Axillary   Axillary  SpO2:  95% 97% 100%  Weight:    77.4 kg (170 lb 10.2 oz)  Height:    5\' 11"  (1.803 m)    General: Alert, Awake and Oriented to Time, Place and Person. Appear in severe distress, affect blunted, fearfull Eyes: PERRL, Conjunctiva normal ENT: Oral Mucosa clear moist. Neck: difficult to assess JVD, no Abnormal Mass Or lumps Cardiovascular: S1 and S2 Present, no Murmur, Peripheral Pulses Present Respiratory: increased respiratory effort, Bilateral Air entry equal and Decreased, positive use of accessory muscle, bilateral Crackles, no wheezes Abdomen: Bowel Sound present, Soft and no tenderness, no hernia Skin: no redness, no  Rash, no induration Extremities: no Pedal edema, no calf tenderness Neurologic: Grossly no focal neuro deficit. Bilaterally Equal motor strength  Labs on Admission:  CBC:  Recent Labs Lab 02/20/17 1047  WBC 7.1  NEUTROABS PENDING  HGB 7.4*  HCT 21.9*  MCV 84.6  PLT 10*   Basic Metabolic Panel:  Recent Labs Lab 02/20/17 1047  NA 137  K 3.4*  CL 103  CO2 26  GLUCOSE 168*  BUN 12  CREATININE 0.78  CALCIUM 8.4*   GFR: Estimated Creatinine Clearance: 92.8 mL/min (by C-G formula based on SCr of 0.78 mg/dL). Liver Function Tests:  Recent Labs Lab 02/20/17 1047  AST 22  ALT 15*  ALKPHOS 79  BILITOT 1.3*  PROT 6.5  ALBUMIN 2.2*   No results for input(s): LIPASE, AMYLASE in the last 168 hours. No results for input(s): AMMONIA in the last 168 hours. Coagulation Profile: No results for input(s): INR, PROTIME in the last 168 hours. Cardiac Enzymes: No results for input(s): CKTOTAL, CKMB, CKMBINDEX, TROPONINI in the last 168 hours. BNP (last 3 results) No results for input(s): PROBNP in the last 8760 hours. HbA1C: No results for input(s): HGBA1C in the last 72 hours. CBG: No results for input(s): GLUCAP in the last 168 hours. Lipid Profile: No results for input(s): CHOL, HDL, LDLCALC, TRIG, CHOLHDL, LDLDIRECT in the last 72 hours. Thyroid Function Tests: No results for input(s): TSH, T4TOTAL, FREET4, T3FREE, THYROIDAB in the last 72 hours. Anemia Panel: No results for input(s): VITAMINB12, FOLATE, FERRITIN, TIBC, IRON, RETICCTPCT in the last 72 hours. Urine analysis:    Component Value Date/Time   COLORURINE YELLOW 12/27/2014 1155   APPEARANCEUR CLEAR 12/27/2014 1155   LABSPEC 1.025 12/27/2014 1155   PHURINE 5.5 12/27/2014 1155   GLUCOSEU NEGATIVE 12/27/2014 1155   HGBUR MODERATE (A) 12/27/2014 1155   BILIRUBINUR SMALL (A) 12/27/2014 1155   BILIRUBINUR neg 12/09/2010 1427   KETONESUR 40 (A) 12/27/2014 1155   PROTEINUR 30 (A) 12/27/2014 1155   UROBILINOGEN  1.0 12/27/2014 1155   NITRITE NEGATIVE 12/27/2014 1155   LEUKOCYTESUR NEGATIVE 12/27/2014 1155    Radiological Exams on Admission: Dg Chest Port 1 View  Result Date: 02/20/2017 CLINICAL DATA:  Shortness of breath and cough.  EXAM: PORTABLE CHEST 1 VIEW COMPARISON:  12/27/2014 FINDINGS: 1044 hours. Lordotic positioning. Asymmetric elevation right hemidiaphragm with right base collapse/ consolidation. Vascular congestion noted with interstitial prominence raising the question of interstitial edema. The cardio pericardial silhouette is enlarged. Right Port-A-Cath tip overlies the mid SVC level. Telemetry leads overlie the chest. IMPRESSION: Low volume lordotic film shows asymmetric elevation right hemidiaphragm with right base collapse/ consolidation and probable interstitial pulmonary edema. Electronically Signed   By: Misty Stanley M.D.   On: 02/20/2017 11:10   Assessment/Plan 1. Acute hypoxemic respiratory failure ([place])  Healthcare associated pneumonia. Sepsis.  Presented in severe respiratory distress. With fever or tachycardia as well as active right lobar pneumonia meeting sepsis criteria. Next and patient was actually started on IV antibiotics in the ER.  Currently patient on comfort care protocol. Use when necessary morphine at present.  2. Goals of care. Family actually request transition patient's care to complete comfort. Family is also interested in hospice. At present the patient appears to be in significant respiratory distress and requires frequent titration of medication. Currently I will use IV morphine when necessary and assess patient's requirement for respiratory distress. Depending on the requirement patient will likely require morphine infusion to keep him comfortable. Palliative care consulted. Social worker consulted for inpatient hospice facility although currently holding off until palliative care discussion. When necessary Ativan, when necessary Zofran.  3.  AML. Pancytopenia. Patient receiving blood transfusion and the other facility. Currently platelet counts are 10 and hemoglobin is 7.4 on change from his recent, done in [facility]. On comfort care at present.  Nutrition: comfort feeds  Advance goals of care discussion: DNR DNI comfort care   Consults: palliative care  Family Communication: family was present at bedside, at the time of interview.  Opportunity was given to ask question and all questions were answered satisfactorily.  Disposition: Admitted as inpatient, med-surge unit. Likely to be discharged hospice, in 1-2 days.  Author: Berle Mull, MD [facility] Pager: [phone] 02/20/2017  If 7PM-7AM, please contact night-coverage [email or web address] Password [email or web address]

## 2017-02-20 NOTE — ED Notes (Signed)
ATTEMPT TO ACCESS PORT. EDP YAO REQUEST BLOOD CULTURE FROM PORT. NOT SUCCESS WITH ACCESS. IV TEAM CONSULT

## 2017-02-20 NOTE — ED Triage Notes (Signed)
Per Wife- Transfused last Friday. Hospitalized [facility] 10 days. Seen by PCP. Seeking other options for treatment. DNR. Altered mental status started yesterday.

## 2017-02-20 NOTE — ED Notes (Signed)
Family at bedside. 

## 2017-02-20 NOTE — ED Notes (Signed)
ADMITTING Provider at bedside. 

## 2017-02-20 NOTE — ED Provider Notes (Signed)
[facility] DEPT Provider Note   CSN: [id number] Arrival date & time: 02/20/17  6440     History   Chief Complaint Chief Complaint  Patient presents with  . Altered Mental Status  . Fever  . DNR    HPI William Stokes is a 71 y.o. male history of AML, atrial fibrillation, diabetes, fungal pneumonia and recurrent sepsis here presenting with shortness of breath, altered mental status, fever. Patient gets weekly transfusions at [facility]. Patient received Platelet and red blood cell transfusion 2 days ago at [facility]. Patient was noted to be more hypoxic last night. As per the wife, he usually is on 1-2 L nasal cannula only at night and she notices oxygen was 88% 2 L and bump him up to 4 L nasal cannula was able to get him up to 92%. Also has been progressively more altered and short of breath since yesterday. They did not notice any fever until he arrived to the ED.   Of note, patient had a long and complicated visits and [facility]. Patient had several episodes of bacteremia in February and in April. During the April admission, patient was thought to have a possible combined bacterial and fungal pneumonia. Patient is still taking Levaquin 500 mg daily and still on Vfen for fungal pneumonia. Patient also has tried chemotherapy before as well as some experimental treatments. Wife is a retired Marine scientist and she currently just once and comfortable. She wants to make him for palliative and confirmed that he is a DNR. Patient unable to give me any history.      The history is provided by the spouse.   Level V caveat- AMS   Past Medical History:  Diagnosis Date  . Adenomatous colon polyp   . 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 Anxiety   . Atrial fibrillation with RVR ([place]) 2016  . Cataracts, bilateral    immature  . Diabetes mellitus 2/09   takes Januvia-started 2wks ago  . Eczema    legs and hands  . Erectile dysfunction   . GERD (gastroesophageal reflux disease)    takes  OMeprazole daily  . Glaucoma   . H/O septic shock 2016  . H/O transfusion of packed red blood cells 2016,2017   [facility]  . Hyperlipidemia    was on Crestor but has been off for a couple of months  . Hypertension 3/11   takes Lisinopril daily  . Insomnia   . LAFB (left anterior fascicular block)    per baseline EKG 2009  . Osteoarthritis    left knee  . Primary male hypogonadism   . PTSD (post-traumatic stress disorder)    went to [place], has chronic diff. sleeping  . Respiratory failure with hypoxia ([place]) 2016   Acute  . Sleep apnea   . Sleep apnea, obstructive    uses CPAP and last sleep study in epic from 2011    Patient Active Problem List   Diagnosis Date Noted  . PCP NOTES >>> 05/21/2015  . Hypomagnesemia 02/20/2015  . Reduced mobility 01/21/2015  . 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 Acute myelogenous leukemia ([place]) 12/27/2014  . Acute myeloblastic leukemia in remission ([place]) 12/27/2014  . 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 Left knee DJD 03/28/2012    Class: Chronic  . Medicare annual wellness visit, initial 02/23/2011  . GERD (gastroesophageal reflux disease) 02/23/2011  . INADEQUATE SLEEP HYGIENE 10/18/2009  . OBSTRUCTIVE SLEEP APNEA 10/06/2009  . ESSENTIAL HYPERTENSION 09/23/2009  .  SLEEP DISORDER 09/23/2009  . HYPOGONADISM, MALE 11/18/2008  . GLAUCOMA 07/17/2008  . Dyslipidemia 04/17/2008  . ERECTILE DYSFUNCTION 04/17/2008  . PTSD 01/01/2008  . Diabetes ([place]) 10/25/2007  . OSTEOARTHRITIS 10/10/2007    Past Surgical History:  Procedure Laterality Date  . COLONOSCOPY    . HERNIA REPAIR    . KNEE ARTHROSCOPY  1999   meniscal torn L, Dr.Dalldorf  . QUADRICEPS TENDON REPAIR  while in [place]   left leg  . TOTAL KNEE ARTHROPLASTY  03/28/2012   LEFT TOTAL KNEE ARTHROPLASTY;  Surgeon: Hessie Dibble, MD;  Location: [facility];  Service: Orthopedics;  Laterality: Left;  with revision tibia  . TOTAL KNEE ARTHROPLASTY  08/03/2012   Procedure: TOTAL KNEE  ARTHROPLASTY;  Surgeon: Hessie Dibble, MD;  Location: [facility];  Service: Orthopedics;  Laterality: Right;       Home Medications    Prior to Admission medications   Medication Sig Start Date End Date Taking? Authorizing Provider  atorvastatin (LIPITOR) 80 MG tablet Take 1 tablet (80 mg total) by mouth daily. 12/15/15   Colon Branch, MD  buPROPion [facility] SR) 150 MG 12 hr tablet Take 150 mg by mouth 2 (two) times daily.  05/12/15   Colon Branch, MD  dorzolamide-timolol (COSOPT) 22.3-6.8 MG/ML ophthalmic solution Place 1 drop into both eyes 2 (two) times daily. 05/12/15   Colon Branch, MD  fluconazole (DIFLUCAN) 200 MG tablet Take 200 mg by mouth daily. Reported on 10/29/2015 05/12/15   Colon Branch, MD  glucose blood (FREESTYLE LITE) test strip Check blood sugar twice daily. 02/12/15   Colon Branch, MD  ibuprofen (ADVIL,MOTRIN) 800 MG tablet Take 800 mg by mouth 2 (two) times daily.    [provider]  Lancets (FREESTYLE) lancets Check once daily. Dx: 250.00 01/17/13   Colon Branch, MD  levofloxacin (LEVAQUIN) 500 MG tablet Take 500 mg by mouth daily. Reported on 10/29/2015 05/12/15   Colon Branch, MD  lidocaine-prilocaine (EMLA) cream Apply 1 application topically once. 1 hour prior to port access 05/12/15   Colon Branch, MD  lisinopril (PRINIVIL,ZESTRIL) 10 MG tablet Take 1 tablet (10 mg total) by mouth daily. 05/12/15   Colon Branch, MD  Magnesium Oxide 400 (240 MG) MG TABS Take 1 tablet by mouth 2 (two) times daily. 03/26/15   Colon Branch, MD  metFORMIN (GLUCOPHAGE) 850 MG tablet Take 1 tablet (850 mg total) by mouth daily with breakfast. 05/12/15   Colon Branch, MD  Midostaurin (RYDAPT) 25 MG CAPS Take 50 mg by mouth 2 (two) times daily.    [provider]  omeprazole (PRILOSEC) 40 MG capsule Take 1 capsule (40 mg total) by mouth daily. 06/27/13 08/05/16  Colon Branch, MD  ondansetron (ZOFRAN) 8 MG tablet Take by mouth every 8 (eight) hours as needed for nausea or vomiting. Reported on  10/29/2015 05/12/15   Colon Branch, MD  prochlorperazine (COMPAZINE) 10 MG tablet Take 10 mg by mouth every 6 (six) hours as needed for nausea or vomiting. Reported on 10/29/2015 05/12/15   Colon Branch, MD  sertraline (ZOLOFT) 100 MG tablet Take 2 tablets (200 mg total) by mouth daily. 02/12/15   Colon Branch, MD    Family History Family History  Problem Relation Age of Onset  . Heart disease Mother        MI age onset 66s?  . Diabetes Mother   . Prostate cancer Father  dx age 77s  . Colon cancer Neg Hx     Social History Social History  Substance Use Topics  . Smoking status: Former Smoker    Packs/day: 2.00    Years: 22.00    Types: Cigarettes    Quit date: 08/17/1991  . Smokeless tobacco: Never Used  . Alcohol use No     Allergies   Nitrofurantoin   Review of Systems Review of Systems  Unable to perform ROS: Mental status change  All other systems reviewed and are negative.    Physical Exam Updated Vital Signs BP 121/67   Pulse (!) 116   Temp (!) 101.2 F (38.4 C) (Axillary)   Resp (!) 24   Ht 5\' 11"  (1.803 m)   Wt 88.7 kg (195 lb 8 oz)   SpO2 98%   BMI 27.27 kg/m   Physical Exam  Constitutional:  Chronically ill   HENT:  Head: Normocephalic.  MM dry   Eyes: EOM are normal. Pupils are equal, round, and reactive to light.  Neck: Normal range of motion. Neck supple.  Cardiovascular:  Tachycardic   Pulmonary/Chest:  Crackles bilateral bases   Abdominal: Soft. Bowel sounds are normal. He exhibits no distension. There is no tenderness.  Musculoskeletal: Normal range of motion.  Stage 2-3 sacral decubs   Neurological:  Altered, difficult to arouse. Moving all extremities   Skin: Skin is warm.  Psychiatric:  Unable   Nursing note and vitals reviewed.    ED Treatments / Results  Labs (all labs ordered are listed, but only abnormal results are displayed) Labs Reviewed  CBC WITH DIFFERENTIAL/PLATELET - Abnormal; Notable for the following:        Result Value   RBC 2.59 (*)    Hemoglobin 7.4 (*)    HCT 21.9 (*)    RDW 16.4 (*)    Platelets 10 (*)    All other components within normal limits  COMPREHENSIVE METABOLIC PANEL - Abnormal; Notable for the following:    Potassium 3.4 (*)    Glucose, Bld 168 (*)    Calcium 8.4 (*)    Albumin 2.2 (*)    ALT 15 (*)    Total Bilirubin 1.3 (*)    All other components within normal limits  BLOOD GAS, VENOUS - Abnormal; Notable for the following:    pCO2, Ven 41.8 (*)    Acid-Base Excess 2.3 (*)    All other components within normal limits  CULTURE, BLOOD (ROUTINE X 2)  CULTURE, BLOOD (ROUTINE X 2)  URINE CULTURE  URINALYSIS, ROUTINE W REFLEX MICROSCOPIC  I-STAT CG4 LACTIC ACID, ED  TYPE AND SCREEN    EKG  EKG Interpretation  Date/Time:  Sunday February 20 2017 10:23:50 EDT Ventricular Rate:  135 PR Interval:    QRS Duration: 111 QT Interval:  312 QTC Calculation: 470 R Axis:   -48 Text Interpretation:  Sinus tachycardia Multiform ventricular premature complexes LAD, consider left anterior fascicular block Abnormal R-wave progression, early transition Repol abnrm suggests ischemia, diffuse leads ST elevation, consider lateral injury poor baseline. tachycardic  Confirmed by Wandra Arthurs [phone]) on 02/20/2017 10:42:03 AM Also confirmed by Wandra Arthurs [phone]), editor Drema Pry [phone])  on 02/20/2017 10:46:32 AM       Radiology Dg Chest Port 1 View  Result Date: 02/20/2017 CLINICAL DATA:  Shortness of breath and cough. EXAM: PORTABLE CHEST 1 VIEW COMPARISON:  12/27/2014 FINDINGS: 1044 hours. Lordotic positioning. Asymmetric elevation right hemidiaphragm with right base collapse/ consolidation. Vascular congestion  noted with interstitial prominence raising the question of interstitial edema. The cardio pericardial silhouette is enlarged. Right Port-A-Cath tip overlies the mid SVC level. Telemetry leads overlie the chest. IMPRESSION: Low volume lordotic film shows asymmetric  elevation right hemidiaphragm with right base collapse/ consolidation and probable interstitial pulmonary edema. Electronically Signed   By: Misty Stanley M.D.   On: 02/20/2017 11:10    Procedures Procedures (including critical care time)  Medications Ordered in ED Medications  sodium chloride 0.9 % bolus 1,000 mL (1,000 mLs Intravenous New Bag/Given 02/20/17 1104)    And  sodium chloride 0.9 % bolus 1,000 mL (0 mLs Intravenous Stopped 02/20/17 1146)    And  sodium chloride 0.9 % bolus 1,000 mL (1,000 mLs Intravenous New Bag/Given 02/20/17 1235)  vancomycin (VANCOCIN) IVPB 1000 mg/200 mL premix (1,000 mg Intravenous New Bag/Given 02/20/17 1223)  alteplase (CATHFLO ACTIVASE) injection 2 mg (2 mg Intracatheter Not Given 02/20/17 1235)  ceFEPIme (MAXIPIME) 1 g in dextrose 5 % 50 mL IVPB (not administered)  vancomycin (VANCOCIN) IVPB 1000 mg/200 mL premix (not administered)  sodium chloride flush (NS) 0.9 % injection 10-40 mL (not administered)  sodium chloride flush (NS) 0.9 % injection 10-40 mL (not administered)  ceFEPIme (MAXIPIME) 2 g in dextrose 5 % 50 mL IVPB (2 g Intravenous New Bag/Given 02/20/17 1209)  acetaminophen (TYLENOL) suppository 650 mg (650 mg Rectal Given 02/20/17 1240)     Initial Impression / Assessment and Plan / ED Course  I have reviewed the triage vital signs and the nursing notes.  Pertinent labs & imaging results that were available during my care of the patient were reviewed by me and considered in my medical decision making (see chart for details).     William Stokes is a 71 y.o. male here with AMS. Patient has hx of AML and is still on Vfen and levaquin for fungal vs bacterial pneumonia. Patient febrile, tachy, hypoxic in the ED. Patient is DNR per wife. She wants palliative care and comfort only. Will do code sepsis and get labs, CXR. Will give vanc/cefepime empirically   12:52 PM WBC nl. Hg 7.4, similar to 2 days ago. Platelet 10, was 11 on 7/6. CXR showed persistent  pneumonia. Given vanc/cefepime. Given worsening shortness of breath and tachypnea and oxygen requirement, will admit for IV abx. Likely need palliative care consult for goals of care.   Final Clinical Impressions(s) / ED Diagnoses   Final diagnoses:  None    New Prescriptions New Prescriptions   No medications on file     Drenda Freeze, MD 02/20/17 1253

## 2017-02-20 NOTE — ED Notes (Signed)
AWARE OF COMFORT CARE

## 2017-02-20 NOTE — ED Notes (Signed)
IV TEAM PRESENT ASSISTING WITH ACCESSING PORT

## 2017-02-20 NOTE — ED Notes (Signed)
Delay on access in port. Need verification on location. Port not placed in [place] system

## 2017-02-20 NOTE — Progress Notes (Signed)
Pharmacy Antibiotic Note  William Stokes is a 71 y.o. male admitted on 02/20/2017 with pneumonia.  Presented with altered mental status and fever.  Code Sepsis initiated.  PMH significant for AML fungal pneumonia, recurrent sepsis and AFib. Cefepime 2gm and Vancomycin 1gm IV x 1 dose each ordered in the ED.  Pharmacy has been consulted for Vancomycin and Cefepime dosing.  Plan:  Vancomycin 1gm IV q8h  Cefepime 1gm IV q8h  Follow renal function, cultures, clinical course  Height: 5\' 11"  (180.3 cm) Weight: 195 lb 8 oz (88.7 kg) IBW/kg (Calculated) : 75.3  Temp (24hrs), Avg:101.4 F (38.6 C), Min:101.4 F (38.6 C), Max:101.4 F (38.6 C)   Recent Labs Lab 02/20/17 1047 02/20/17 1105  CREATININE 0.78  --   LATICACIDVEN  --  1.00    Estimated Creatinine Clearance: 92.8 mL/min (by C-G formula based on SCr of 0.78 mg/dL).    Allergies  Allergen Reactions  . Nitrofurantoin Rash    Antimicrobials this admission: 7/8 vanc >>   7/8 cefepime >>    Dose adjustments this admission:    Microbiology results: 7/8 BCx: sent 7/8 UCx: sent   Thank you for allowing pharmacy to be a part of this patient's care.  Everette Rank, PharmD 02/20/2017 12:06 PM

## 2017-02-20 NOTE — ED Notes (Addendum)
WIFE REQUEST HOSPICE/ PALLIATIVE CARE. PT IS DNR. YELLOW COPY PRESENT

## 2017-02-20 NOTE — ED Notes (Signed)
ED Provider at bedside. 

## 2017-02-20 NOTE — ED Notes (Signed)
Bed: PP89 Expected date:  Expected time:  Means of arrival:  Comments: EMS- fungal PNA, leukemia

## 2017-02-21 ENCOUNTER — Encounter (HOSPITAL_COMMUNITY): Payer: Self-pay

## 2017-02-21 DIAGNOSIS — B49 Unspecified mycosis: Secondary | ICD-10-CM

## 2017-02-21 DIAGNOSIS — J9601 Acute respiratory failure with hypoxia: Secondary | ICD-10-CM

## 2017-02-21 DIAGNOSIS — J17 Pneumonia in diseases classified elsewhere: Secondary | ICD-10-CM

## 2017-02-21 DIAGNOSIS — D61818 Other pancytopenia: Secondary | ICD-10-CM

## 2017-02-21 DIAGNOSIS — J189 Pneumonia, unspecified organism: Secondary | ICD-10-CM

## 2017-02-21 LAB — CBC WITH DIFFERENTIAL/PLATELET
BASOS ABS: 0 10*3/uL (ref 0.0–0.1)
BASOS PCT: 0 %
Band Neutrophils: 0 %
Blasts: 83 %
EOS ABS: 0 10*3/uL (ref 0.0–0.7)
Eosinophils Relative: 0 %
HCT: 21.9 % — ABNORMAL LOW (ref 39.0–52.0)
Hemoglobin: 7.4 g/dL — ABNORMAL LOW (ref 13.0–17.0)
Lymphocytes Relative: 13 %
Lymphs Abs: 0.9 10*3/uL (ref 0.7–4.0)
MCH: 28.6 pg (ref 26.0–34.0)
MCHC: 33.8 g/dL (ref 30.0–36.0)
MCV: 84.6 fL (ref 78.0–100.0)
METAMYELOCYTES PCT: 0 %
MONO ABS: 0.3 10*3/uL (ref 0.1–1.0)
Monocytes Relative: 4 %
Myelocytes: 0 %
NEUTROS PCT: 0 %
NRBC: 0 /100{WBCs}
Neutro Abs: 0 10*3/uL — ABNORMAL LOW (ref 1.7–7.7)
Other: 0 %
PLATELETS: 10 10*3/uL — AB (ref 150–400)
PROMYELOCYTES ABS: 0 %
RBC: 2.59 MIL/uL — ABNORMAL LOW (ref 4.22–5.81)
RDW: 16.4 % — ABNORMAL HIGH (ref 11.5–15.5)
WBC: 7.1 10*3/uL (ref 4.0–10.5)

## 2017-02-21 MED ORDER — LORAZEPAM 2 MG/ML IJ SOLN
1.0000 mg | INTRAMUSCULAR | Status: DC | PRN
  Administered 2017-02-21: 1 mg via INTRAVENOUS
  Filled 2017-02-21: qty 1

## 2017-02-21 NOTE — Consult Note (Signed)
[facility])  Received request from [place] for family interest in [facility]. Chart reviewed and spoke with family by phone to confirm interest. Made CSW and family aware [facility] does not have a room available today. Will continue to follow and update CSW re availability. Thank you. Erling Conte, [place]

## 2017-02-21 NOTE — Clinical Social Work Note (Signed)
Clinical Social Work Assessment  Patient Details  Name: William Stokes MRN: [id number] Date of Birth: 1946-03-02  Date of referral:  02/21/17               Reason for consult:  End of Life/Hospice                Permission sought to share information with:    Permission granted to share information::     Name::        Agency::     Relationship::     Contact Information:     Housing/Transportation Living arrangements for the past 2 months:  Single Family Home Source of Information:  Spouse Patient Interpreter Needed:  None Criminal Activity/Legal Involvement Pertinent to Current Situation/Hospitalization:  No - Comment as needed Significant Relationships:  Adult Children, Spouse Lives with:  Spouse Do you feel safe going back to the place where you live?  Yes Need for family participation in patient care:  Yes (Comment)  Care giving concerns:  End Of Life/ Residential Hospice  Social Worker assessment / plan:  CSW met with patient spouse at bedside introduced role and reason for visit- to assist with discharge planning to residential hospice. Patient reports she would like for the patient to go to [name]. She reports, she and her sons have agreed.  Patient became tearful about patient transition. She reports it has been a battle for the patient over the last two years. He has went through several chemo treatments at [facility].  She reports the patient has contracted pneumonia and will not get better with aggressive treatment.   Plan: CSW inquired about placement at [facility].   Employment status:  Retired Forensic scientist:  Medicare PT Recommendations:  Not assessed at this time William Stokes / Referral to community resources:     Patient/Family's Response to care:  Prefers [facility] for End of life care.   Patient/Family's Understanding of and Emotional Response to Diagnosis, Current Treatment, and Prognosis: Patient family agreeable to  residential hospice.    Emotional Assessment Appearance:    Attitude/Demeanor/Rapport:  Unable to Assess Affect (typically observed):    Orientation:  Oriented to Self Alcohol / Substance use:  Not Applicable Psych involvement (Current and /or in the community):  No (Comment)  Discharge Needs  Concerns to be addressed:  Discharge Planning Concerns Readmission within the last 30 days:  No Current discharge risk:  Terminally ill Barriers to Discharge:  Hospice Bed not available   William Hopping, LCSW 02/21/2017, 12:51 PM

## 2017-02-21 NOTE — Progress Notes (Signed)
PROGRESS NOTE    William Stokes  ZCH:[id number] DOB: 08/08/1946 DOA: 02/20/2017 PCP: Colon Branch, MD    Brief Narrative:  71 y.o. male with Past medical history of acute myelomonocytic leukemia, undergoing therapy, fungal pneumonia, chronic respiratory failure, pressure ulcers, A. fib, chronic pain, diabetes, hypertension, depression, GERD. The patient presented to the ER with the complains of shortness of breath. Patient was recently hospitalized at [facility] for sepsis due to worsening of his fungal pneumonia. Patient was aggressively treated with IV antibiotics broad-spectrum as well as to cover for fungal infection. Admitted to ICU and discharged on 02/16/2017. After going home patient was in poor health and continues to have shortness of breath which was progressively worsening. There was also increasing confusion and lethargy. Patient was having minimal oral intake. There was no diarrhea reported no abdominal pain reported. No bleeding reported. No recent change in medications reported.  Assessment & Plan:   Principal Problem:   Acute hypoxemic respiratory failure (HCC) Active Problems:   Acute myelogenous leukemia ([place])   Fungal pneumonia   Sepsis ([place])   Pancytopenia ([place])  1. Acute hypoxemic respiratory failure ([place])  Healthcare associated pneumonia. Sepsis.  -Presented in severe respiratory distress. With fever or tachycardia as well as active right lobar pneumonia meeting sepsis criteria.  -Pt was started on IV antibiotics in the ER.  -Family has requested full comfort. -Continue with necessary morphine at present.  2. Goals of care. -Family actually request transition patient's care to complete comfort. -Currently I will use IV morphine when necessary and assess patient's requirement for respiratory distress. -Discussed with SW. No beds available at [facility] place at this time. Pending placement Cont with PRN Ativan, when necessary Zofran.  3.  AML. -Pancytopenia. -Now on full comfort measures  DVT prophylaxis: Comfort care Code Status: DNR, comfort Family Communication: Pt in room, family at bedside Disposition Plan: Pending hospice - home vs residential  Consultants:     Procedures:     Antimicrobials: Anti-infectives    Start     Dose/Rate Route Frequency Ordered Stop   02/20/17 2100  vancomycin (VANCOCIN) IVPB 1000 mg/200 mL premix  Status:  Discontinued     1,000 mg 200 mL/hr over 60 Minutes Intravenous Every 8 hours 02/20/17 1215 02/20/17 1331   02/20/17 2000  ceFEPIme (MAXIPIME) 1 g in dextrose 5 % 50 mL IVPB  Status:  Discontinued     1 g 100 mL/hr over 30 Minutes Intravenous Every 8 hours 02/20/17 1214 02/20/17 1331   02/20/17 1030  ceFEPIme (MAXIPIME) 2 g in dextrose 5 % 50 mL IVPB     2 g 100 mL/hr over 30 Minutes Intravenous  Once 02/20/17 1027 02/20/17 1239   02/20/17 1030  vancomycin (VANCOCIN) IVPB 1000 mg/200 mL premix     1,000 mg 200 mL/hr over 60 Minutes Intravenous  Once 02/20/17 1027 02/20/17 1323       Subjective: Appears anxious. Unable to speak given acute respiratory distress  Objective: Vitals:   02/20/17 2033 02/20/17 2336 02/21/17 0614 02/21/17 0900  BP: (!) 96/59  (!) 107/55   Pulse: (!) 130  (!) 125   Resp: 20  (!) 22   Temp: (!) 102.4 F (39.1 C) 98.4 F (36.9 C) 98.4 F (36.9 C) (!) 102 F (38.9 C)  TempSrc: Oral Oral Oral Axillary  SpO2: 97%  99%   Weight:      Height:        Intake/Output Summary (Last 24 hours) at  02/21/17 1415 Last data filed at 02/21/17 0710  Gross per 24 hour  Intake           204.83 ml  Output             1000 ml  Net          -795.17 ml   Filed Weights   02/20/17 1023 02/20/17 1534  Weight: 88.7 kg (195 lb 8 oz) 77.4 kg (170 lb 10.2 oz)    Examination:  General exam: appears anxious, increased resp effort  Respiratory system: coarse breathsounds throughout Cardiovascular system: S1 & S2 heard, RRR. Gastrointestinal system:  Abdomen is nondistended, soft and nontender. No organomegaly or masses felt. Normal bowel sounds heard. Central nervous system: patient is alert,. No focal neurological deficits. Extremities: Symmetric 5 x 5 power. Skin: No rashes, lesions Psychiatry: difficult to assess given patient's respiratory distress.   Data Reviewed: I have personally reviewed following labs and imaging studies  CBC:  Recent Labs Lab 02/20/17 1047  WBC 7.1  NEUTROABS 0.0*  HGB 7.4*  HCT 21.9*  MCV 84.6  PLT 10*   Basic Metabolic Panel:  Recent Labs Lab 02/20/17 1047  NA 137  K 3.4*  CL 103  CO2 26  GLUCOSE 168*  BUN 12  CREATININE 0.78  CALCIUM 8.4*   GFR: Estimated Creatinine Clearance: 92.8 mL/min (by C-G formula based on SCr of 0.78 mg/dL). Liver Function Tests:  Recent Labs Lab 02/20/17 1047  AST 22  ALT 15*  ALKPHOS 79  BILITOT 1.3*  PROT 6.5  ALBUMIN 2.2*   No results for input(s): LIPASE, AMYLASE in the last 168 hours. No results for input(s): AMMONIA in the last 168 hours. Coagulation Profile: No results for input(s): INR, PROTIME in the last 168 hours. Cardiac Enzymes: No results for input(s): CKTOTAL, CKMB, CKMBINDEX, TROPONINI in the last 168 hours. BNP (last 3 results) No results for input(s): PROBNP in the last 8760 hours. HbA1C: No results for input(s): HGBA1C in the last 72 hours. CBG: No results for input(s): GLUCAP in the last 168 hours. Lipid Profile: No results for input(s): CHOL, HDL, LDLCALC, TRIG, CHOLHDL, LDLDIRECT in the last 72 hours. Thyroid Function Tests: No results for input(s): TSH, T4TOTAL, FREET4, T3FREE, THYROIDAB in the last 72 hours. Anemia Panel: No results for input(s): VITAMINB12, FOLATE, FERRITIN, TIBC, IRON, RETICCTPCT in the last 72 hours. Sepsis Labs:  Recent Labs Lab 02/20/17 1105  LATICACIDVEN 1.00    Recent Results (from the past 240 hour(s))  Blood culture (routine x 2)     Status: None (Preliminary result)   Collection  Time: 02/20/17 11:00 AM  Result Value Ref Range Status   Specimen Description BLOOD LEFT HAND  Final   Special Requests   Final    BOTTLES DRAWN AEROBIC AND ANAEROBIC Blood Culture adequate volume   Culture   Final    NO GROWTH < 24 HOURS Performed at [facility], [place] [address], [place], [place] [address]    Report Status PENDING  Incomplete  Blood culture (routine x 2)     Status: None (Preliminary result)   Collection Time: 02/20/17 11:50 AM  Result Value Ref Range Status   Specimen Description BLOOD RIGHT HAND  Final   Special Requests IN PEDIATRIC BOTTLE Blood Culture adequate volume  Final   Culture   Final    NO GROWTH < 24 HOURS Performed at [facility], [place] [address], [place], [place] [address]    Report Status  PENDING  Incomplete  Culture, blood (Routine X 2) w Reflex to ID Panel     Status: None (Preliminary result)   Collection Time: 02/20/17 12:21 PM  Result Value Ref Range Status   Specimen Description BLOOD RIGHT PORTA CATH  Final   Special Requests   Final    BOTTLES DRAWN AEROBIC AND ANAEROBIC Blood Culture adequate volume   Culture   Final    NO GROWTH < 24 HOURS Performed at [facility], [address] [place], [place] [address]    Report Status PENDING  Incomplete     Radiology Studies: Dg Chest Port 1 View  Result Date: 02/20/2017 CLINICAL DATA:  Shortness of breath and cough. EXAM: PORTABLE CHEST 1 VIEW COMPARISON:  12/27/2014 FINDINGS: 1044 hours. Lordotic positioning. Asymmetric elevation right hemidiaphragm with right base collapse/ consolidation. Vascular congestion noted with interstitial prominence raising the question of interstitial edema. The cardio pericardial silhouette is enlarged. Right Port-A-Cath tip overlies the mid SVC level. Telemetry leads overlie the chest. IMPRESSION: Low volume lordotic film shows asymmetric elevation right hemidiaphragm with right base collapse/ consolidation and probable interstitial pulmonary  edema. Electronically Signed   By: Misty Stanley M.D.   On: 02/20/2017 11:10    Scheduled Meds: . alteplase  2 mg Intracatheter Once  . sodium chloride flush  10-40 mL Intracatheter Q12H  . sodium chloride flush  3 mL Intravenous Q12H   Continuous Infusions: . sodium chloride 250 mL (02/21/17 0655)     LOS: 1 day   Adiya Selmer, Orpah Melter, MD [facility] Pager [phone]  If 7PM-7AM, please contact night-coverage [email or web address] Password [email or web address] 02/21/2017, 2:15 PM

## 2017-02-21 NOTE — Progress Notes (Signed)
Nutrition Brief Note  Chart reviewed. Per MD note pt's family requesting comfort care. Pt likely to discharge home with Hospice.  No nutrition interventions warranted at this time.  Please re-consult as needed.   Koleen Distance MS, RD, LDN Pager #- [phone] After Hours Pager: [phone]

## 2017-02-21 NOTE — Progress Notes (Addendum)
At this time the liaison reports there are no beds at this time. She informed patient spouse. CSW inquired if family is willing to send patient to [facility] residential, she reports she would like to discuss with her sons at this time. Waiting for family decision.  Physician updated.   Kathrin Greathouse, Latanya Presser, MSW Clinical Social Worker 5E and Psychiatric Service Line [phone] 02/21/2017  1:43 PM

## 2017-02-22 MED ORDER — ACETAMINOPHEN 325 MG PO TABS: 650.0000 mg | ORAL_TABLET | Freq: Four times a day (QID) | ORAL | Status: AC | PRN

## 2017-02-22 MED ORDER — HALOPERIDOL 0.5 MG PO TABS: 0.5000 mg | ORAL_TABLET | ORAL | Status: AC | PRN

## 2017-02-22 MED ORDER — LORAZEPAM 2 MG/ML PO CONC: 1.0000 mg | ORAL | 0 refills | Status: AC | PRN

## 2017-02-22 MED ORDER — LORAZEPAM 1 MG PO TABS: 1.0000 mg | ORAL_TABLET | ORAL | 0 refills | Status: AC | PRN

## 2017-02-22 MED ORDER — ONDANSETRON 4 MG PO TBDP: 4.0000 mg | ORAL_TABLET | Freq: Four times a day (QID) | ORAL | 0 refills | Status: AC | PRN

## 2017-02-22 MED ORDER — IBUPROFEN 200 MG PO TABS: 400.0000 mg | ORAL_TABLET | ORAL | Status: DC | PRN

## 2017-02-22 MED ORDER — IBUPROFEN 400 MG PO TABS: 400.0000 mg | ORAL_TABLET | ORAL | 0 refills | Status: AC | PRN

## 2017-02-22 MED ORDER — ALBUTEROL SULFATE (2.5 MG/3ML) 0.083% IN NEBU: 2.5000 mg | INHALATION_SOLUTION | RESPIRATORY_TRACT | 12 refills | Status: AC | PRN

## 2017-02-22 MED ORDER — ACETAMINOPHEN 650 MG RE SUPP: 650.0000 mg | Freq: Four times a day (QID) | RECTAL | 0 refills | Status: AC | PRN

## 2017-02-22 MED ORDER — GLYCOPYRROLATE 1 MG PO TABS: 1.0000 mg | ORAL_TABLET | ORAL | Status: AC | PRN

## 2017-02-22 NOTE — Progress Notes (Signed)
Patient's wife requesting patient to receive tylenol suppository prior to discharge. Patient's fever was 103.1. Tylenol suppository given. Cool wash cloth's on forehead and neck. Patient's wife says this is baseline.

## 2017-02-22 NOTE — Consult Note (Signed)
[facility]: With bed available MSW reached out to see if the pt family would be in agreement to our Hospice home. The wife states they live in [place] area and this isn't that far from them. Explained the hospice philosophy and hospice home service agreement. They are in agreement to move pt. Our medical director approved pt and family accepted the bed offer. Will call for transport around 430pm by ambulance. Nicole MSw aware and notifying MD of bed availability. Thank you for thinking of [place]. Webb Silversmith RN

## 2017-02-22 NOTE — Progress Notes (Signed)
[facility] called for transport, pick up scheduled for 4:30pm Nurse given report, transport packet left at nurse station.   Kathrin Greathouse, Latanya Presser, MSW Clinical Social Worker 5E and Psychiatric Service Line [phone] 02/22/2017  3:40 PM

## 2017-02-22 NOTE — Progress Notes (Addendum)
CSW met with patient spouse this a.m about discharge to residential hospice. At this time, [facility] and [facility] do not have beds available.  CSW provided list of residential facilities outside of [place]. Pt. Spouse is discussing options with family.She reports she does not feel comfortable with Home w/ Hospice. CSW will continue to assist with discharge.    Kathrin Greathouse, Latanya Presser, MSW Clinical Social Worker 5E and Psychiatric Service Line [phone] 02/22/2017  9:21 AM

## 2017-02-22 NOTE — Discharge Summary (Signed)
Physician Discharge Summary  DRAYVEN MARCHENA MGQ:[id number] DOB: Mar 07, 1946 DOA: 02/20/2017  PCP: Colon Branch, MD  Admit date: 02/20/2017 Discharge date: 02/22/2017  Admitted From: Home Disposition:  Residential Hospice  Recommendations for Outpatient Follow-up:  1. Follow up per hospice provider  CODE STATUS:DNR, comfort Diet recommendation: comfort   Brief/Interim Summary: 71 y.o.malewith Past medical history of acute myelomonocytic leukemia, undergoing therapy, fungal pneumonia, chronic respiratory failure, pressure ulcers, A. fib, chronic pain, diabetes, hypertension, depression, GERD. The patient presented to the ER with the complains of shortness of breath. Patient was recently hospitalized at [facility] for sepsis due to worsening of his fungal pneumonia. Patient was aggressively treated with IV antibiotics broad-spectrum as well as to cover for fungal infection. Admitted to ICU and discharged on 02/16/2017. After going home patient was in poor health and continues to have shortness of breath which was progressively worsening. There was also increasing confusion and lethargy. Patient was having minimal oral intake. There was no diarrhea reported no abdominal pain reported. No bleeding reported. No recent change in medications reported.  1. Acute hypoxemic respiratory failure ([place]) Healthcare associated pneumonia. Sepsis.  -Presented in severe respiratory distress. With fever or tachycardia as well as active right lobar pneumonia meeting sepsis criteria.  -Pt was started on IV antibiotics in the ER.  -Family has requested full comfort. -Continue with necessary morphine at present. -Patient to transition to residential hospice -Prognosis is less than 2 weeks  2.Goals of care. -Family actually request transition patient's care to complete comfort. -Currently I will use IV morphine when necessary and assess patient's requirement for respiratory  distress. -Discussed with SW. No beds available at [facility] place at this time. Pending placement Cont with PRN Ativan, when necessary Zofran.  3.AML. -Pancytopenia. -Now on full comfort measures  Discharge Diagnoses:  Principal Problem:   Acute hypoxemic respiratory failure ([place]) Active Problems:   Acute myelogenous leukemia ([place])   Fungal pneumonia   Sepsis ([place])   Pancytopenia ([place])    Discharge Instructions   Allergies as of 02/22/2017      Reactions   Nitrofurantoin Rash      Medication List    STOP taking these medications   acyclovir 400 MG tablet Commonly known as:  ZOVIRAX   allopurinol 300 MG tablet Commonly known as:  ZYLOPRIM   atorvastatin 80 MG tablet Commonly known as:  LIPITOR   benzonatate 100 MG capsule Commonly known as:  TESSALON   buPROPion 150 MG 12 hr tablet Commonly known as:  WELLBUTRIN SR   COSOPT 22.3-6.8 MG/ML ophthalmic solution Generic drug:  dorzolamide-timolol   diphenhydrAMINE 25 mg capsule Commonly known as:  BENADRYL   freestyle lancets   glucose blood test strip Commonly known as:  FREESTYLE LITE   guaiFENesin-codeine 100-10 MG/5ML syrup   hydroxyurea 500 MG capsule Commonly known as:  HYDREA   levofloxacin 500 MG tablet Commonly known as:  LEVAQUIN   lidocaine-prilocaine cream Commonly known as:  EMLA   magnesium (amino acid chelate) 133 MG tablet   metFORMIN 850 MG tablet Commonly known as:  GLUCOPHAGE   omeprazole 20 MG capsule Commonly known as:  PRILOSEC   omeprazole 40 MG capsule Commonly known as:  PRILOSEC   ondansetron 8 MG tablet Commonly known as:  ZOFRAN   sertraline 100 MG tablet Commonly known as:  ZOLOFT   voriconazole 200 MG tablet Commonly known as:  VFEND   voriconazole 50 MG tablet Commonly known as:  VFEND  TAKE these medications   acetaminophen 325 MG tablet Commonly known as:  TYLENOL Take 2 tablets (650 mg total) by mouth every 6 (six) hours as needed for mild  pain (or Fever >/= 101).   acetaminophen 650 MG suppository Commonly known as:  TYLENOL Place 1 suppository (650 mg total) rectally every 6 (six) hours as needed for mild pain (or Fever >/= 101).   albuterol (2.5 MG/3ML) 0.083% nebulizer solution Commonly known as:  PROVENTIL Take 3 mLs (2.5 mg total) by nebulization every 2 (two) hours as needed for wheezing.   furosemide 20 MG tablet Commonly known as:  LASIX Take 20 mg by mouth daily as needed for fluid or edema.   glycopyrrolate 1 MG tablet Commonly known as:  ROBINUL Take 1 tablet (1 mg total) by mouth every 4 (four) hours as needed (excessive secretions).   haloperidol 0.5 MG tablet Commonly known as:  HALDOL Take 1 tablet (0.5 mg total) by mouth every 4 (four) hours as needed for agitation (or delirium).   ibuprofen 400 MG tablet Commonly known as:  ADVIL,MOTRIN Take 1 tablet (400 mg total) by mouth every 4 (four) hours as needed for fever.   LORazepam 1 MG tablet Commonly known as:  ATIVAN Take 1 tablet (1 mg total) by mouth every 4 (four) hours as needed for anxiety.   LORazepam 2 MG/ML concentrated solution Commonly known as:  ATIVAN Place 0.5 mLs (1 mg total) under the tongue every 4 (four) hours as needed for anxiety.   ondansetron 4 MG disintegrating tablet Commonly known as:  ZOFRAN-ODT Take 1 tablet (4 mg total) by mouth every 6 (six) hours as needed for nausea.      Follow-up Information    Follow up per Hospice provider Follow up.          Allergies  Allergen Reactions  . Nitrofurantoin Rash     Procedures/Studies: Dg Chest Port 1 View  Result Date: 02/20/2017 CLINICAL DATA:  Shortness of breath and cough. EXAM: PORTABLE CHEST 1 VIEW COMPARISON:  12/27/2014 FINDINGS: 1044 hours. Lordotic positioning. Asymmetric elevation right hemidiaphragm with right base collapse/ consolidation. Vascular congestion noted with interstitial prominence raising the question of interstitial edema. The cardio  pericardial silhouette is enlarged. Right Port-A-Cath tip overlies the mid SVC level. Telemetry leads overlie the chest. IMPRESSION: Low volume lordotic film shows asymmetric elevation right hemidiaphragm with right base collapse/ consolidation and probable interstitial pulmonary edema. Electronically Signed   By: Misty Stanley M.D.   On: 02/20/2017 11:10    Subjective: Unable to provide given declining status  Discharge Exam: Vitals:   02/22/17 1046 02/22/17 1300  BP:  133/71  Pulse:  (!) 109  Resp:  (!) 21  Temp: 99 F (37.2 C) 99.9 F (37.7 C)   Vitals:   02/22/17 0532 02/22/17 0830 02/22/17 1046 02/22/17 1300  BP: 120/64 99/64  133/71  Pulse: (!) 135 (!) 136  (!) 109  Resp: (!) 26   (!) 21  Temp: (!) 100.7 F (38.2 C) (!) 100.7 F (38.2 C) 99 F (37.2 C) 99.9 F (37.7 C)  TempSrc: Oral Axillary Axillary Axillary  SpO2: 97% 98%  100%  Weight:      Height:        General: awake, not alert, appears comfortable Cardiovascular: RRR, S1/S2 +, no rubs, no gallops Respiratory: CTA bilaterally, no wheezing, no rhonchi Abdominal: Soft, NT, ND, bowel sounds + Extremities: no edema, no cyanosis   The results of significant diagnostics from this hospitalization (  including imaging, microbiology, ancillary and laboratory) are listed below for reference.     Microbiology: Recent Results (from the past 240 hour(s))  Blood culture (routine x 2)     Status: None (Preliminary result)   Collection Time: 02/20/17 11:00 AM  Result Value Ref Range Status   Specimen Description BLOOD LEFT HAND  Final   Special Requests   Final    BOTTLES DRAWN AEROBIC AND ANAEROBIC Blood Culture adequate volume   Culture   Final    NO GROWTH 2 DAYS Performed at [facility], [address] [place], [place] [address]    Report Status PENDING  Incomplete  Blood culture (routine x 2)     Status: None (Preliminary result)   Collection Time: 02/20/17 11:50 AM  Result Value Ref Range Status    Specimen Description BLOOD RIGHT HAND  Final   Special Requests IN PEDIATRIC BOTTLE Blood Culture adequate volume  Final   Culture   Final    NO GROWTH 2 DAYS Performed at [facility], [place] [address], [place], [place] [address]    Report Status PENDING  Incomplete  Culture, blood (Routine X 2) w Reflex to ID Panel     Status: None (Preliminary result)   Collection Time: 02/20/17 12:21 PM  Result Value Ref Range Status   Specimen Description BLOOD RIGHT PORTA CATH  Final   Special Requests   Final    BOTTLES DRAWN AEROBIC AND ANAEROBIC Blood Culture adequate volume   Culture   Final    NO GROWTH 2 DAYS Performed at [facility], [place] [address], [place], [place] [address]    Report Status PENDING  Incomplete     Labs: BNP (last 3 results) No results for input(s): BNP in the last 8760 hours. Basic Metabolic Panel:  Recent Labs Lab 02/20/17 1047  NA 137  K 3.4*  CL 103  CO2 26  GLUCOSE 168*  BUN 12  CREATININE 0.78  CALCIUM 8.4*   Liver Function Tests:  Recent Labs Lab 02/20/17 1047  AST 22  ALT 15*  ALKPHOS 79  BILITOT 1.3*  PROT 6.5  ALBUMIN 2.2*   No results for input(s): LIPASE, AMYLASE in the last 168 hours. No results for input(s): AMMONIA in the last 168 hours. CBC:  Recent Labs Lab 02/20/17 1047  WBC 7.1  NEUTROABS 0.0*  HGB 7.4*  HCT 21.9*  MCV 84.6  PLT 10*   Cardiac Enzymes: No results for input(s): CKTOTAL, CKMB, CKMBINDEX, TROPONINI in the last 168 hours. BNP: Invalid input(s): POCBNP CBG: No results for input(s): GLUCAP in the last 168 hours. D-Dimer No results for input(s): DDIMER in the last 72 hours. Hgb A1c No results for input(s): HGBA1C in the last 72 hours. Lipid Profile No results for input(s): CHOL, HDL, LDLCALC, TRIG, CHOLHDL, LDLDIRECT in the last 72 hours. Thyroid function studies No results for input(s): TSH, T4TOTAL, T3FREE, THYROIDAB in the last 72 hours.  Invalid input(s): FREET3 Anemia work up No  results for input(s): VITAMINB12, FOLATE, FERRITIN, TIBC, IRON, RETICCTPCT in the last 72 hours. Urinalysis    Component Value Date/Time   COLORURINE YELLOW 12/27/2014 [place] 12/27/2014 1155   LABSPEC 1.025 12/27/2014 1155   PHURINE 5.5 12/27/2014 1155   GLUCOSEU NEGATIVE 12/27/2014 1155   HGBUR MODERATE (A) 12/27/2014 1155   BILIRUBINUR SMALL (A) 12/27/2014 1155   BILIRUBINUR neg 12/09/2010 1427   KETONESUR 40 (A) 12/27/2014 1155   PROTEINUR 30 (A)  12/27/2014 1155   UROBILINOGEN 1.0 12/27/2014 1155   NITRITE NEGATIVE 12/27/2014 1155   LEUKOCYTESUR NEGATIVE 12/27/2014 1155   Sepsis Labs Invalid input(s): PROCALCITONIN,  WBC,  LACTICIDVEN Microbiology Recent Results (from the past 240 hour(s))  Blood culture (routine x 2)     Status: None (Preliminary result)   Collection Time: 02/20/17 11:00 AM  Result Value Ref Range Status   Specimen Description BLOOD LEFT HAND  Final   Special Requests   Final    BOTTLES DRAWN AEROBIC AND ANAEROBIC Blood Culture adequate volume   Culture   Final    NO GROWTH 2 DAYS Performed at [facility], [place] [address], [place], [place] [address]    Report Status PENDING  Incomplete  Blood culture (routine x 2)     Status: None (Preliminary result)   Collection Time: 02/20/17 11:50 AM  Result Value Ref Range Status   Specimen Description BLOOD RIGHT HAND  Final   Special Requests IN PEDIATRIC BOTTLE Blood Culture adequate volume  Final   Culture   Final    NO GROWTH 2 DAYS Performed at [facility], [place] [address] [place], [place] [address]    Report Status PENDING  Incomplete  Culture, blood (Routine X 2) w Reflex to ID Panel     Status: None (Preliminary result)   Collection Time: 02/20/17 12:21 PM  Result Value Ref Range Status   Specimen Description BLOOD RIGHT PORTA CATH  Final   Special Requests   Final    BOTTLES DRAWN AEROBIC AND ANAEROBIC Blood Culture adequate volume   Culture   Final    NO GROWTH 2  DAYS Performed at [facility], [place] [address] [place], [place] [address]    Report Status PENDING  Incomplete     SIGNED:   CHIU, Orpah Melter, MD  [facility] 02/22/2017, 3:06 PM  If 7PM-7AM, please contact night-coverage [email or web address] Password [email or web address]

## 2017-02-22 NOTE — Progress Notes (Signed)
[facility] has opening, CSW informed patient spouse about the placement. At this time she is agreeable to meet with the [facility] for intake screening/assessment CSW will continue to follow for d/c needs.  Kathrin Greathouse, Latanya Presser, MSW Clinical Social Worker 5E and Psychiatric Service Line [phone] 02/22/2017  1:52 PM

## 2017-02-22 NOTE — Progress Notes (Signed)
Report given to [place] at [facility]. No questions or concerns at this time.

## 2017-02-24 ENCOUNTER — Telehealth: Payer: Self-pay

## 2017-02-24 NOTE — Telephone Encounter (Signed)
Pt passed away at [facility] March 09, 2017.

## 2017-02-24 NOTE — Telephone Encounter (Signed)
He was a very nice gentleman. So sad about his death

## 2017-02-25 LAB — CULTURE, BLOOD (ROUTINE X 2)
CULTURE: NO GROWTH
Culture: NO GROWTH
Culture: NO GROWTH
SPECIAL REQUESTS: ADEQUATE
Special Requests: ADEQUATE
Special Requests: ADEQUATE

## 2017-03-10 ENCOUNTER — Telehealth: Payer: Self-pay | Admitting: *Deleted

## 2017-03-10 NOTE — Telephone Encounter (Signed)
[facility] forwarded to provider/SLS 07/26

## 2017-03-16 DEATH — deceased

## 2018-05-24 IMAGING — DX DG CHEST 1V PORT
1 series · 1 of 1 positions shown · non-contrast
Comparison: 12/27/2014

CLINICAL DATA: Shortness of breath and cough.

EXAM:
PORTABLE CHEST 1 VIEW

[chest ap]
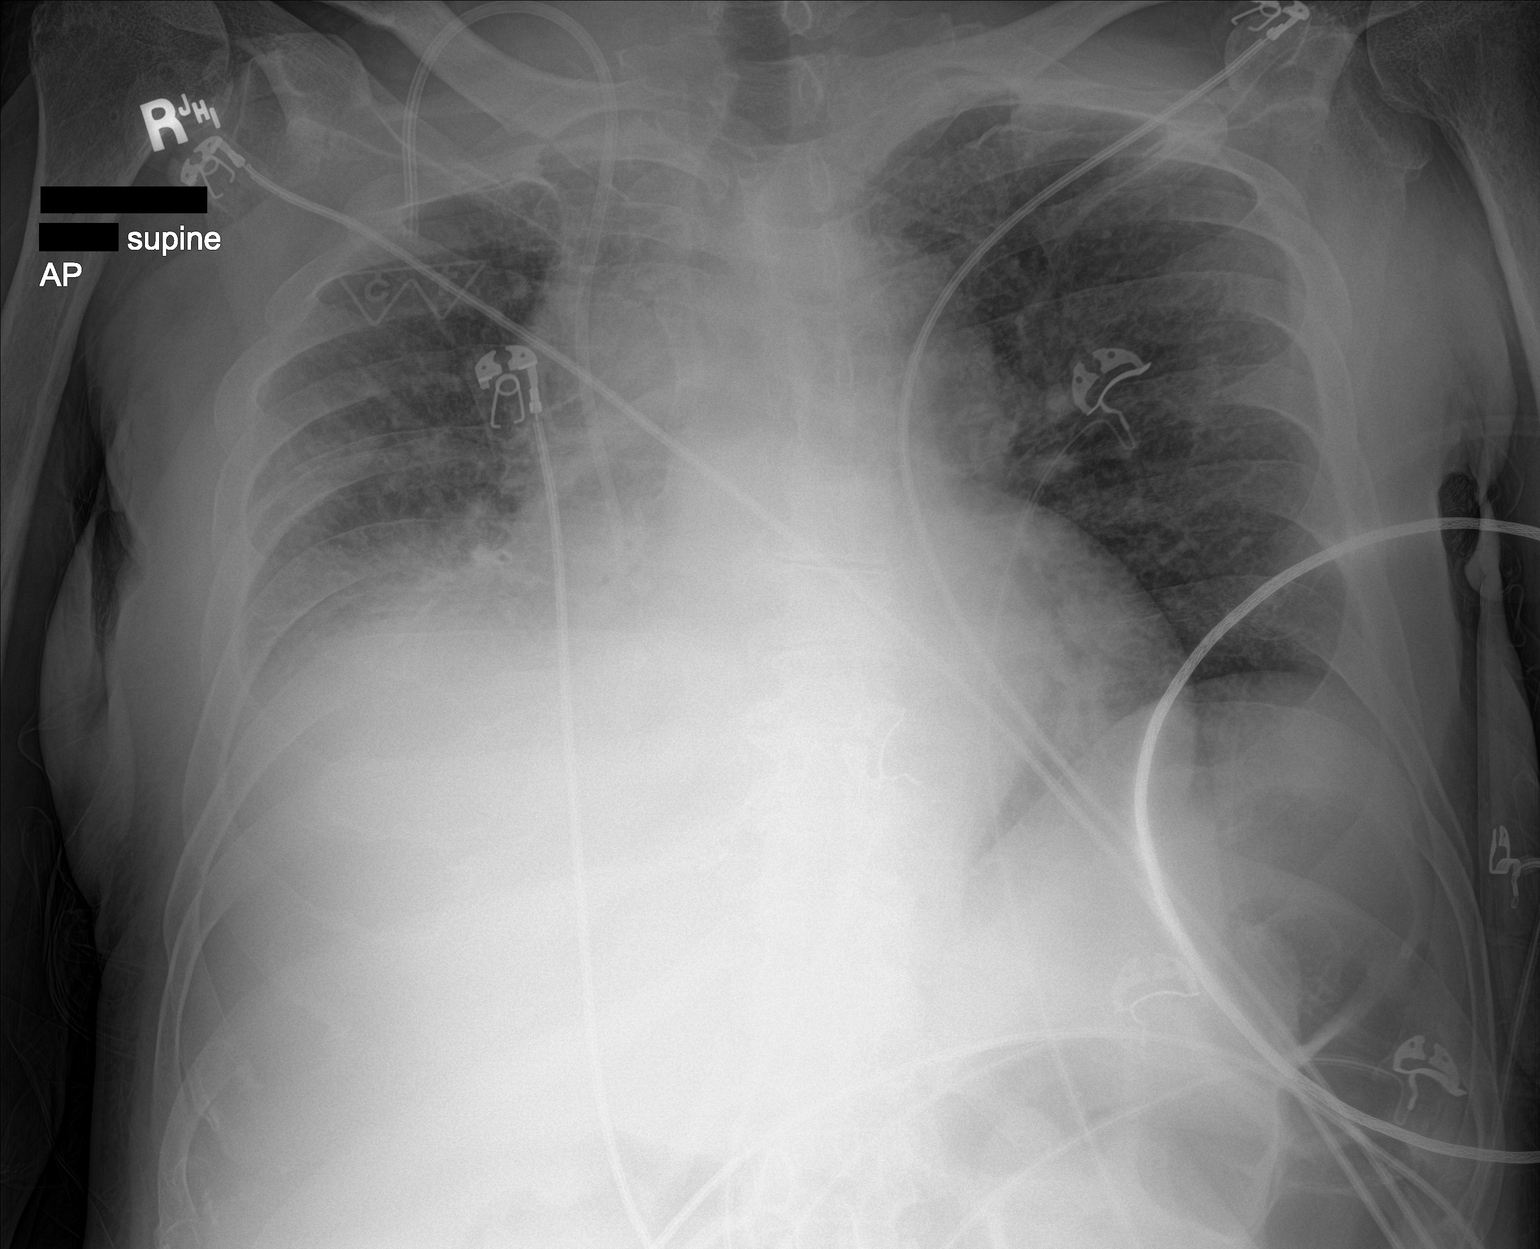

[1 of 1 positions shown; findings below may reference images not displayed]

FINDINGS: 2911 hours. Lordotic positioning. Asymmetric elevation right
hemidiaphragm with right base collapse/ consolidation. Vascular
congestion noted with interstitial prominence raising the question
of interstitial edema. The cardio pericardial silhouette is
enlarged. Right Port-A-Cath tip overlies the mid SVC level.
Telemetry leads overlie the chest.
IMPRESSION: Low volume lordotic film shows asymmetric elevation right
hemidiaphragm with right base collapse/ consolidation and probable
interstitial pulmonary edema.
